# Patient Record
Sex: Male | Born: 1965 | Race: Black or African American | Hispanic: No | Marital: Married | Smoking: Never smoker
Health system: Southern US, Community
[De-identification: ages and names within clinical notes are randomized; demographics above are authoritative.]

## PROBLEM LIST (undated history)

## (undated) DIAGNOSIS — Z8049 Family history of malignant neoplasm of other genital organs: Secondary | ICD-10-CM

## (undated) DIAGNOSIS — Z800 Family history of malignant neoplasm of digestive organs: Secondary | ICD-10-CM

## (undated) DIAGNOSIS — Z9221 Personal history of antineoplastic chemotherapy: Secondary | ICD-10-CM

## (undated) DIAGNOSIS — G629 Polyneuropathy, unspecified: Secondary | ICD-10-CM

## (undated) DIAGNOSIS — E119 Type 2 diabetes mellitus without complications: Secondary | ICD-10-CM

## (undated) DIAGNOSIS — E785 Hyperlipidemia, unspecified: Secondary | ICD-10-CM

## (undated) DIAGNOSIS — Z8042 Family history of malignant neoplasm of prostate: Secondary | ICD-10-CM

## (undated) HISTORY — DX: Family history of malignant neoplasm of digestive organs: Z80.0

## (undated) HISTORY — DX: Family history of malignant neoplasm of prostate: Z80.42

## (undated) HISTORY — DX: Family history of malignant neoplasm of other genital organs: Z80.49

## (undated) HISTORY — DX: Type 2 diabetes mellitus without complications: E11.9

## (undated) HISTORY — PX: APPENDECTOMY: SHX54

## (undated) HISTORY — DX: Hyperlipidemia, unspecified: E78.5

## (undated) HISTORY — PX: HERNIA REPAIR: SHX51

---

## 2002-09-23 ENCOUNTER — Ambulatory Visit (HOSPITAL_BASED_OUTPATIENT_CLINIC_OR_DEPARTMENT_OTHER): Admission: RE | Admit: 2002-09-23 | Discharge: 2002-09-23 | Payer: Self-pay | Admitting: Orthopedic Surgery

## 2003-01-09 HISTORY — PX: ACHILLES TENDON REPAIR: SUR1153

## 2013-06-19 ENCOUNTER — Encounter: Payer: 59 | Attending: Internal Medicine | Admitting: Dietician

## 2013-06-19 ENCOUNTER — Encounter: Payer: Self-pay | Admitting: Dietician

## 2013-06-19 VITALS — Ht 68.25 in

## 2013-06-19 DIAGNOSIS — Z713 Dietary counseling and surveillance: Secondary | ICD-10-CM | POA: Diagnosis not present

## 2013-06-19 DIAGNOSIS — E1149 Type 2 diabetes mellitus with other diabetic neurological complication: Secondary | ICD-10-CM | POA: Insufficient documentation

## 2013-06-19 DIAGNOSIS — E785 Hyperlipidemia, unspecified: Secondary | ICD-10-CM | POA: Insufficient documentation

## 2013-06-19 NOTE — Progress Notes (Signed)
Appt start time: 0800 end time:  0900.  Assessment:  Patient was seen on  06/19/13 for individual diabetes education. Pt reports with admitted long term poor compliance to diet and exercise recommendations. Over course of session, however, pt admitted that no previous encounters were very well focused on the realities of his lifestyle, particularly travel, or his preferences. Pt stated clear willingness to restrict starches and eat more nonstarchy vegetables, but needed more clear options that are available during travel. He also enjoys lean proteins quite a bit, so a high protein diet for weight loss fit his preferences well. He does not wish to entirely d/c sugary drinks, but seems willing to decrease intake and avoid pairing them with starches at meals. Pt also states a willingness to exercise, but he states no one has laid out a plan that he could fit into his travel life with longer work hours.  Current HbA1c: unknown. Pt states over 8.   Preferred Learning Style:   No preference indicated   Learning Readiness:   Contemplating  MEDICATIONS: see list  DIETARY INTAKE: Pt is travels for work every week.  Usual eating pattern includes 2-3 meals and 0-1 snacks per day. Everyday foods include salads, seafood.  Avoided foods include none noted.    24-hr recall:  B ( AM): sausage, egg, and cheese biscuit with OJ and hash brown on travel days. Other days at hotel, fruit, bacon, avoids bread or will get a bagel with cream cheese, fruit juice to drink. Fri, Sat, Sun does not eat breakfast.  Snk ( AM): no snack, water or a 2nd juice  L ( PM): salad (greek salad or one with fruit and nuts). [place] and cheese 6 in sub from [employer] (olives, oil and vinegar, mayo). Fridays and beond when home still usually eaten out- hot dogs, soups, mcdonalds, salad with tuna on top. Fountain drink.  Snk ( PM): maybe some cold cuts, grapes, apple sauce, jello cups with fruit. OJ and lemonade, fruit punch.  D ( PM):  stuffed fish, shrimp, and mostly other seafood. Greek salad with salmon is a common favorite. Side dishes largely include rice, collard greens, sometimes fries. Focus tends to be on the protein foods. Fountain drink or EtOH drink (1). EtOH drink more common of late.  Snk ( PM): dessert only once every 2 weeks, night time snacks rare.  Beverages: juices, lemonade, soda, wine or cocktail. Juices and sodas tend to be 16 oz each.  Usual physical activity: no formal exercise. He routinely plans to exercise in his travel packing but it doesn't happen. 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 No active hobbies noted. Pt notes he enjoys cycling. He used to enjoy pick up basketball related to fear for injury, after injuring his achilles playing flag football.   Progress Towards Goal(s):  In progress.   Nutritional Diagnosis:  NI-5.8.2 Excessive carbohydrate intake As related to frequent consumption of sugary beverages.  As evidenced by diet recall showing 4 or more sugary drinks per day, BMI>30, HgbA1c>8.    Intervention:  Nutrition counseling provided.  Discussed diabetes disease process and treatment options.  Discussed physiology of diabetes and role of obesity on insulin resistance.  Encouraged moderate weight reduction to improve glucose levels.  Discussed role of medications and diet in glucose control  Provided education on macronutrients on glucose levels.  Provided education on carb counting, importance of regularly scheduled meals/snacks, and meal planning  Discussed effects of physical activity on glucose levels and  long-term glucose control.  Recommended 150 minutes of physical activity/week.  Reviewed patient medications.  Discussed role of medication on blood glucose and possible side effects  Discussed blood glucose monitoring and interpretation.  Discussed recommended target ranges and individual ranges.    Described short-term complications: hyper- and hypo-glycemia.  Discussed  causes,symptoms, and treatment options.  Discussed prevention, detection, and treatment of long-term complications.  Discussed the role of prolonged elevated glucose levels on body systems.  Discussed role of stress on blood glucose levels and discussed strategies to manage psychosocial issues.  Discussed recommendations for long-term diabetes self-care.  Established checklist for medical, dental, and emotional self-care.  Teaching Method Utilized:  Visual Auditory  Handouts given during visit include:  Living Well with Diabetes  The Plate Method  Carb Counting Handbook  Best Pro, Fat, and CHO rich foods for heart health  Barriers to learning/adherence to lifestyle change: preference for sweet drinks, long work hours  Diabetes self-care support plan:   [facility] support group  RD and pt worked together to establish a few specific goals around his schedule: 1. Pt will drink only one 16 oz. Sugary beverage per day, and this will be at a meal with little to no other CHO foods.  2. Pt will exercise as follows- skip Monday for travel and long work day. Tu-Th 15 minutes before breakfast and 15 minutes before dinner (total 30 minutes or more during work days). Fri-Sat 30 minutes per day working up to 60 minutes per day by increasing 5 minutes per day q 2 weeks. 3. Pt will follow a 3 carb choices per meal plus 1 carb choice snack per day eating plan. His drink will count as all carb choices for that meal. To do this, a number of restaurant options were discussed, and getting just a protein food and nonstarchy vegetables with dinner to pair with his sweet drink will likely work out best.   Demonstrated degree of understanding via:  Teach Back   Monitoring/Evaluation:  Dietary intake, exercise, portion control, and body weight in 2 month(s).

## 2013-08-21 ENCOUNTER — Ambulatory Visit: Payer: 59 | Admitting: *Deleted

## 2013-11-16 ENCOUNTER — Other Ambulatory Visit: Payer: Self-pay | Admitting: Family Medicine

## 2013-11-16 DIAGNOSIS — R748 Abnormal levels of other serum enzymes: Secondary | ICD-10-CM

## 2013-11-20 ENCOUNTER — Ambulatory Visit
Admission: RE | Admit: 2013-11-20 | Discharge: 2013-11-20 | Disposition: A | Discharge status: Home / Self Care | Payer: 59 | Source: Ambulatory Visit | Attending: Family Medicine | Admitting: Family Medicine

## 2013-11-20 DIAGNOSIS — R748 Abnormal levels of other serum enzymes: Secondary | ICD-10-CM

## 2016-01-16 DIAGNOSIS — J069 Acute upper respiratory infection, unspecified: Secondary | ICD-10-CM | POA: Diagnosis not present

## 2016-02-03 DIAGNOSIS — E1165 Type 2 diabetes mellitus with hyperglycemia: Secondary | ICD-10-CM | POA: Diagnosis not present

## 2016-02-03 DIAGNOSIS — Z23 Encounter for immunization: Secondary | ICD-10-CM | POA: Diagnosis not present

## 2016-02-03 DIAGNOSIS — E1142 Type 2 diabetes mellitus with diabetic polyneuropathy: Secondary | ICD-10-CM | POA: Diagnosis not present

## 2016-02-03 DIAGNOSIS — Z794 Long term (current) use of insulin: Secondary | ICD-10-CM | POA: Diagnosis not present

## 2016-03-31 DIAGNOSIS — J029 Acute pharyngitis, unspecified: Secondary | ICD-10-CM | POA: Diagnosis not present

## 2016-03-31 DIAGNOSIS — J0100 Acute maxillary sinusitis, unspecified: Secondary | ICD-10-CM | POA: Diagnosis not present

## 2016-05-04 DIAGNOSIS — Z794 Long term (current) use of insulin: Secondary | ICD-10-CM | POA: Diagnosis not present

## 2016-05-04 DIAGNOSIS — E1165 Type 2 diabetes mellitus with hyperglycemia: Secondary | ICD-10-CM | POA: Diagnosis not present

## 2016-05-04 DIAGNOSIS — E1142 Type 2 diabetes mellitus with diabetic polyneuropathy: Secondary | ICD-10-CM | POA: Diagnosis not present

## 2016-06-01 DIAGNOSIS — L299 Pruritus, unspecified: Secondary | ICD-10-CM | POA: Diagnosis not present

## 2016-06-07 DIAGNOSIS — H401121 Primary open-angle glaucoma, left eye, mild stage: Secondary | ICD-10-CM | POA: Diagnosis not present

## 2016-07-07 DIAGNOSIS — M7662 Achilles tendinitis, left leg: Secondary | ICD-10-CM | POA: Diagnosis not present

## 2016-07-10 DIAGNOSIS — M7662 Achilles tendinitis, left leg: Secondary | ICD-10-CM | POA: Diagnosis not present

## 2016-07-24 DIAGNOSIS — M7662 Achilles tendinitis, left leg: Secondary | ICD-10-CM | POA: Diagnosis not present

## 2017-02-08 DIAGNOSIS — J0100 Acute maxillary sinusitis, unspecified: Secondary | ICD-10-CM | POA: Diagnosis not present

## 2017-02-08 DIAGNOSIS — E1165 Type 2 diabetes mellitus with hyperglycemia: Secondary | ICD-10-CM | POA: Diagnosis not present

## 2017-03-01 DIAGNOSIS — E1142 Type 2 diabetes mellitus with diabetic polyneuropathy: Secondary | ICD-10-CM | POA: Diagnosis not present

## 2017-03-01 DIAGNOSIS — Z794 Long term (current) use of insulin: Secondary | ICD-10-CM | POA: Diagnosis not present

## 2017-03-01 DIAGNOSIS — E1165 Type 2 diabetes mellitus with hyperglycemia: Secondary | ICD-10-CM | POA: Diagnosis not present

## 2017-03-29 DIAGNOSIS — K921 Melena: Secondary | ICD-10-CM | POA: Diagnosis not present

## 2017-04-11 DIAGNOSIS — K625 Hemorrhage of anus and rectum: Secondary | ICD-10-CM | POA: Diagnosis not present

## 2017-05-24 DIAGNOSIS — D126 Benign neoplasm of colon, unspecified: Secondary | ICD-10-CM | POA: Diagnosis not present

## 2017-05-24 DIAGNOSIS — K625 Hemorrhage of anus and rectum: Secondary | ICD-10-CM | POA: Diagnosis not present

## 2017-05-24 DIAGNOSIS — C19 Malignant neoplasm of rectosigmoid junction: Secondary | ICD-10-CM | POA: Diagnosis not present

## 2017-05-31 DIAGNOSIS — C20 Malignant neoplasm of rectum: Secondary | ICD-10-CM | POA: Diagnosis not present

## 2017-06-05 ENCOUNTER — Other Ambulatory Visit: Payer: Self-pay | Admitting: Surgery

## 2017-06-05 DIAGNOSIS — C20 Malignant neoplasm of rectum: Secondary | ICD-10-CM

## 2017-06-06 ENCOUNTER — Ambulatory Visit
Admission: RE | Admit: 2017-06-06 | Discharge: 2017-06-06 | Disposition: A | Discharge status: Home / Self Care | Payer: 59 | Source: Ambulatory Visit | Attending: Surgery | Admitting: Surgery

## 2017-06-06 DIAGNOSIS — C20 Malignant neoplasm of rectum: Secondary | ICD-10-CM | POA: Diagnosis not present

## 2017-06-06 MED ORDER — GADOBENATE DIMEGLUMINE 529 MG/ML IV SOLN
20.0000 mL | Freq: Once | INTRAVENOUS | Status: AC | PRN
  Administered 2017-06-06: 20 mL via INTRAVENOUS

## 2017-06-06 MED ORDER — IOPAMIDOL (ISOVUE-300) INJECTION 61%
100.0000 mL | Freq: Once | INTRAVENOUS | Status: AC | PRN
  Administered 2017-06-06: 100 mL via INTRAVENOUS

## 2017-06-06 MED ORDER — IOHEXOL 300 MG/ML  SOLN
30.0000 mL | Freq: Once | INTRAMUSCULAR | Status: AC | PRN
  Administered 2017-06-06: 30 mL via ORAL

## 2017-06-07 DIAGNOSIS — C20 Malignant neoplasm of rectum: Secondary | ICD-10-CM | POA: Diagnosis not present

## 2017-06-10 ENCOUNTER — Encounter: Payer: Self-pay | Admitting: Radiation Oncology

## 2017-06-10 ENCOUNTER — Telehealth: Payer: Self-pay | Admitting: Oncology

## 2017-06-10 NOTE — Telephone Encounter (Signed)
Appt has been scheduled for the pt to see Dr. Benay Spice on 6/7 at 145pm. Pt aware to arrive 30 minutes early.

## 2017-06-11 ENCOUNTER — Encounter: Payer: Self-pay | Admitting: Genetics

## 2017-06-12 DIAGNOSIS — Z794 Long term (current) use of insulin: Secondary | ICD-10-CM | POA: Diagnosis not present

## 2017-06-12 DIAGNOSIS — C20 Malignant neoplasm of rectum: Secondary | ICD-10-CM | POA: Insufficient documentation

## 2017-06-12 DIAGNOSIS — E1165 Type 2 diabetes mellitus with hyperglycemia: Secondary | ICD-10-CM | POA: Diagnosis not present

## 2017-06-12 DIAGNOSIS — E1142 Type 2 diabetes mellitus with diabetic polyneuropathy: Secondary | ICD-10-CM | POA: Diagnosis not present

## 2017-06-12 NOTE — Progress Notes (Signed)
GI Location of Tumor / Histology: Rectal Cancer  Charles [name] presented to Dr. Michail Sermon with rectal bleeding and intermittent blood tinged stools.    Biopsies of LG Intestine-Cecum, Polyp and LG Intestine, Recto-Sigmoid. 05/24/2017   Colonoscopy 05/24/2017: Likely malignant tumor in the rectosigmoid colon at 12 cm proximal to the anus.  CT C/A/P 06/06/2017: Eccentric hyperenhancing upper left rectal 2.9 cm mass.  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  No definite additional sites of metastatic disease.  Solitary necrotic lesion in the superior right iliac crest nonspecific.  MRI Pelvis 06/06/2017: rectal adenocarcinoma T3c N1, 5.3 cm from the anal sphincter by MRI- interpreted as mid rectum.  Past/Anticipated interventions by surgeon, if any:  Dr. Nadeen Landau 06/07/2017 - Referrals underway for medical oncology and radiation oncology. - Return to the office in 4 weeks for follow-up and coordination of care or sooner based on MDT if necessary.   - We discussed at length today the anatomy physiology of the GI tract.   -We discussed the pathophysiology of rectal cancer.  We also discussed the pathophysiology of colorectal cancer.   -We discussed the role for surgery and potentially medical oncology as well as radiation oncology for neoadjuvant chemoradiation.     Past/Anticipated interventions by medical oncology, if any:  Dr. Benay Spice 06/14/2017 1:45 pm  Weight changes, if any: No  Bowel/Bladder complaints, if any: Has about 5-6 bowel movements a day.   Nausea / Vomiting, if any: No  [name] issues, if any: no [name] noted with bowel movements.  Any blood per rectum:  Blood has tapered down, now its sporadic.  BP 136/72   Pulse 98   Temp 98.4 F (36.9 C)   Resp 18   Ht 5\' 8"  (1.727 m)   Wt 224 lb 9.6 oz (101.9 kg)   SpO2 99%   BMI 34.15 kg/m    Wt Readings from Last 3 Encounters:  06/13/17 224 lb 9.6  oz (101.9 kg)    SAFETY ISSUES:  Prior radiation? No  Pacemaker/ICD? No  Possible current pregnancy? No  Is the patient on methotrexate? No  Current Complaints/Details: Patient does have internal hemorrhoids.

## 2017-06-13 ENCOUNTER — Encounter: Payer: Self-pay | Admitting: Radiation Oncology

## 2017-06-13 ENCOUNTER — Other Ambulatory Visit: Payer: Self-pay

## 2017-06-13 ENCOUNTER — Ambulatory Visit
Admission: RE | Admit: 2017-06-13 | Discharge: 2017-06-13 | Disposition: A | Discharge status: Home / Self Care | Payer: 59 | Source: Ambulatory Visit | Attending: Radiation Oncology | Admitting: Radiation Oncology

## 2017-06-13 VITALS — BP 136/72 | HR 98 | Temp 98.4°F | Resp 18 | Ht 68.0 in | Wt 224.6 lb

## 2017-06-13 DIAGNOSIS — C20 Malignant neoplasm of rectum: Secondary | ICD-10-CM

## 2017-06-13 DIAGNOSIS — Z79899 Other long term (current) drug therapy: Secondary | ICD-10-CM | POA: Diagnosis not present

## 2017-06-13 DIAGNOSIS — E785 Hyperlipidemia, unspecified: Secondary | ICD-10-CM | POA: Diagnosis not present

## 2017-06-13 DIAGNOSIS — E119 Type 2 diabetes mellitus without complications: Secondary | ICD-10-CM | POA: Diagnosis not present

## 2017-06-13 DIAGNOSIS — Z809 Family history of malignant neoplasm, unspecified: Secondary | ICD-10-CM | POA: Insufficient documentation

## 2017-06-13 DIAGNOSIS — K648 Other hemorrhoids: Secondary | ICD-10-CM | POA: Diagnosis not present

## 2017-06-13 DIAGNOSIS — Z794 Long term (current) use of insulin: Secondary | ICD-10-CM | POA: Insufficient documentation

## 2017-06-13 NOTE — Progress Notes (Signed)
Radiation Oncology         [phone] ________________________________ Initial Outpatient Consult Note Name: Kavan Devan        MRN: [id number]  Date of Service: 06/13/2017 DOB: September 08, 1965  CZ:[id number], Dibas, MD  Ileana Roup, MD     REFERRING PHYSICIAN: Ileana Roup, MD   DIAGNOSIS: The encounter diagnosis was Rectal cancer [facility]). 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 male seen at the request of Dr. Dema Severin for newly diagnosed rectal cancer. The patient presented to Dr. Michail Sermon with a one month history of rectal bleeding and intermittent blood tinged stools. He denied any pain at this time. He was referred to Dr. Dema Severin and proceeded to undergo colonoscopy on 05/24/17. This showed a likely malignant tumor in the rectosigmoid colon approximately 12 cm proximal to the anus. Biopsy was also performed at this time. Pathology showed invasive poorly differentiated adenocarcinoma with associated low and high grade adenomatous dysplasia in the recto-sigmoid as well as  tubular adenoma in the cecum.   CT of the chest, abdomen, and pelvis on 06/06/17 for disease staging showed eccentric hyperenhancing upper left rectal mass measuring 2.9 cm as well as multiple enlarged left posterior perirectal and upper left presacral nodes compatible with pelvic nodal metastases. There were no definite additional sites of metastatic disease but there was a solitary necrotic lesion in the superior right iliac crest which was nonspecific. He had an MRI of the pelvis on 06/06/17 which revealed a T3c, N1 rectal adenocarcinoma approximately 5.3 cm from the anal sphincter.   The patient reports his rectal bleeding has now decreased and it only occurs sporadically. He notes 5-6 bowel movements a day and denies pain with bowel movements. He also notes internal hemorrhoids.  He presents to clinic today, with his wife, to discuss the  potential role for radiation therapy in the management of his disease.   PREVIOUS RADIATION THERAPY: No   PAST MEDICAL HISTORY:  Past Medical History:  Diagnosis Date  . Diabetes mellitus without complication ([place])   . Hyperlipidemia        PAST SURGICAL HISTORY: Past Surgical History:  Procedure Laterality Date  . ACHILLES TENDON REPAIR Right 2005  . APPENDECTOMY    . HERNIA REPAIR     as a baby     FAMILY HISTORY:  Family History  Problem Relation Age of Onset  . Hypertension Mother   . Hyperlipidemia Mother   . Cancer Paternal Aunt   . Non-Hodgkin's lymphoma Paternal Aunt      SOCIAL HISTORY:  reports that he has never smoked. He has never used smokeless tobacco. He reports that he drinks alcohol. He reports that he does not use drugs.  He is married and lives in [place].  He works as a Optometrist, frequently traveling for work.   ALLERGIES: Cefradine [cephradine]   MEDICATIONS:  Current Outpatient Medications  Medication Sig Dispense Refill  . atorvastatin (LIPITOR) 80 MG tablet Take 80 mg by mouth daily at 6 PM.     . cetirizine (ZYRTEC) 10 MG tablet Take 10 mg by mouth daily as needed for allergies.    Marland Kitchen empagliflozin (JARDIANCE) 25 MG TABS tablet Take 25 mg by mouth daily.    Marland Kitchen ibuprofen (ADVIL,MOTRIN) 200 MG tablet Take 400 mg by mouth every 6 (six) hours as needed for mild pain.    Marland Kitchen insulin lispro protamine-lispro (HUMALOG 75/25 MIX) (75-25) 100 UNIT/ML SUSP injection  Inject 60-150 Units into the skin 2 (two) times daily with a meal.     . liraglutide (VICTOZA) 18 MG/3ML SOPN Inject 1.8 mg into the skin daily.     No current facility-administered medications for this encounter.      REVIEW OF SYSTEMS: On review of systems, the patient reports that he is doing well overall. He denies any chest pain, shortness of breath, cough, fevers, chills, night sweats, unintended weight changes. He reports 5-6 bowel movements a day without pain. He reports sporadic  blood per rectum. He denies any bladder disturbances, and denies abdominal pain, nausea or vomiting. He denies any new musculoskeletal or joint aches or pains. A complete review of systems is obtained and is otherwise negative.   PHYSICAL EXAM:  Wt Readings from Last 3 Encounters:  06/13/17 224 lb 9.6 oz (101.9 kg)   Temp Readings from Last 3 Encounters:  06/13/17 98.4 F (36.9 C)   BP Readings from Last 3 Encounters:  06/13/17 136/72   Pulse Readings from Last 3 Encounters:  06/13/17 98   Pain Assessment Pain Score: 0-No pain/10  In general this is a well appearing african american gentleman in no acute distress. He is alert and oriented x4 and appropriate throughout the examination. HEENT reveals that the patient is normocephalic, atraumatic. EOMs are intact. PERRLA. Skin is intact without any evidence of gross lesions. Cardiovascular exam reveals a regular rate and rhythm, no clicks rubs or murmurs are auscultated. Chest is clear to auscultation bilaterally. Lymphatic assessment is performed and does not reveal any adenopathy in the cervical, supraclavicular, axillary, or inguinal chains. Abdomen has active bowel sounds in all quadrants and is intact. The abdomen is soft, non tender, non distended. Lower extremities are negative for pretibial pitting edema, deep calf tenderness, cyanosis or clubbing.   ECOG = 0  0 - Asymptomatic (Fully active, able to carry on all predisease activities without restriction)  1 - Symptomatic but completely ambulatory (Restricted in physically strenuous activity but ambulatory and able to carry out work of a light or sedentary nature. For example, light housework, office work)  2 - Symptomatic, <50% in bed during the day (Ambulatory and capable of all self care but unable to carry out any work activities. Up and about more than 50% of waking hours)  3 - Symptomatic, >50% in bed, but not bedbound (Capable of only limited self-care, confined to bed or  chair 50% or more of waking hours)  4 - Bedbound (Completely disabled. Cannot carry on any self-care. Totally confined to bed or chair)  5 - Death   Eustace Pen MM, Creech RH, Tormey DC, et al. [phone]). "Toxicity and response criteria of the [facility]". [place] Oncol. 5 (6): 649-55    LABORATORY DATA:  No results found for: WBC, HGB, HCT, MCV, PLT No results found for: NA, K, CL, CO2 No results found for: ALT, AST, GGT, ALKPHOS, BILITOT    RADIOGRAPHY: Ct Chest W Contrast  Result Date: 06/06/2017 CLINICAL DATA:  Reported history of rectal cancer. Bloody stools. Prior appendectomy. EXAM: CT CHEST, ABDOMEN, AND PELVIS WITH CONTRAST TECHNIQUE: Multidetector CT imaging of the chest, abdomen and pelvis was performed following the standard protocol during bolus administration of intravenous contrast. CONTRAST:  155mL ISOVUE-300 IOPAMIDOL (ISOVUE-300) INJECTION 61%, 68mL OMNIPAQUE IOHEXOL 300 MG/ML SOLN; 30mL OMNIPAQUE IOHEXOL 300 MG/ML SOLN, 151mL ISOVUE-300 IOPAMIDOL (ISOVUE-300) INJECTION 61% COMPARISON:  None. FINDINGS: CT CHEST FINDINGS Cardiovascular: Normal heart size. No significant pericardial effusion/thickening. Normal course  and caliber of the thoracic aorta. 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 Normal caliber pulmonary arteries. No central pulmonary emboli. Mediastinum/Nodes: No discrete thyroid nodules. Unremarkable esophagus. No pathologically enlarged axillary, mediastinal or hilar lymph nodes. Lungs/Pleura: No pneumothorax. No pleural effusion. No acute consolidative airspace disease, lung masses or significant pulmonary nodules. Musculoskeletal:  No aggressive appearing focal osseous lesions. CT ABDOMEN PELVIS FINDINGS Hepatobiliary: Probable diffuse hepatic steatosis. No definite liver surface irregularity. No liver masses. Normal gallbladder with no radiopaque cholelithiasis. No biliary  ductal dilatation. Pancreas: Normal, with no mass or duct dilation. Spleen: Normal size. No mass. Adrenals/Urinary Tract: Normal adrenals. Normal kidneys with no hydronephrosis and no renal mass. Normal bladder. Stomach/Bowel: Normal non-distended stomach. Normal caliber small bowel with no small bowel wall thickening. Appendectomy. There is an eccentric slightly hyperenhancing upper left rectal mass measuring 2.9 x 2.8 x 2.9 cm (series 2/image 101). No additional sites of large bowel wall thickening. Vascular/Lymphatic: Normal caliber abdominal aorta. Patent portal, splenic, hepatic and renal veins. Multiple enlarged left posterior perirectal nodes, largest 1.4 cm (series 2/image 96). Mildly prominent rounded 0.7 cm upper left presacral node (series 2/image 93). No additional pathologically enlarged lymph nodes in the abdomen or pelvis. Reproductive: Top-normal size prostate. Other: No pneumoperitoneum, ascites or focal fluid collection. Musculoskeletal: Small nonspecific 1 cm sclerotic lesion in the superior right iliac crest (series 2/image 89). No additional focal osseous lesions. IMPRESSION: 1. Eccentric hyperenhancing upper left rectal 2.9 cm mass, compatible with the provided history of primary rectal malignancy. 2. 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 3. No definite additional sites of metastatic disease. No liver or thoracic metastases. Solitary small sclerotic lesion in the superior right iliac crest, nonspecific, consider correlation with PET-CT, bone scan or short-term follow-up CT. Electronically Signed   By: Ilona Sorrel M.D.   On: 06/06/2017 13:48   Mr Pelvis W QQ Contrast  Result Date: 06/06/2017 CLINICAL DATA:  Newly diagnosed rectal carcinoma.  Local staging. Creatinine was obtained on site at [place] at [address] Results: Creatinine 0.8 mg/dL. EXAM: MRI PELVIS WITHOUT AND WITH CONTRAST  TECHNIQUE: Multiplanar multisequence MR imaging of the pelvis was performed both before and after administration of intravenous contrast. Small amount of [place] gel was administered per rectum to optimize tumor evaluation. CONTRAST:  68mL MULTIHANCE GADOBENATE DIMEGLUMINE 529 MG/ML IV SOLN COMPARISON:  None. FINDINGS: TUMOR LOCATION Location from Anal Verge: Middle third Shortest Distance from Tumor to Anal Sphincter: 5.3 cm TUMOR DESCRIPTION Circumferential Extent: Left lateral wall from 11-5 o'clock positions Tumor Length: 4.8 cm T - CATEGORY Extension through Muscularis Propria: 12 mm =  T3c Shortest Distance of any tumor/node from Mesorectal Fascia: 5 mm from 33mm high left posterior perirectal lymph node on image 2/4 Extramural Vascular Invasion/Tumor Thrombus:  No Invasion of Anterior Peritoneal Reflection:  No Involvement of Adjacent Organs or Pelvic Sidewall: No Levator Ani Involvement:  No N - CATEGORY Mesorectal Lymph Nodes >=71mm: 3 total, with largest lymph node measuring 14 mm on image 2/4 = N1 Extra-mesorectal Lymphadenopathy:  No Other:  None. IMPRESSION: Rectal adenocarcinoma T stage:  T3c Rectal adenocarcinoma N stage:  N1 Distance from tumor to the anal sphincter is 5.3 cm. Electronically Signed   By: Earle Gell M.D.   On: 06/06/2017 14:30   Ct Abdomen Pelvis W Contrast  Result Date: 06/06/2017 CLINICAL DATA:  Reported history of rectal cancer. Bloody stools. Prior  appendectomy. EXAM: CT CHEST, ABDOMEN, AND PELVIS WITH CONTRAST TECHNIQUE: Multidetector CT imaging of the chest, abdomen and pelvis was performed following the standard protocol during bolus administration of intravenous contrast. CONTRAST:  124mL ISOVUE-300 IOPAMIDOL (ISOVUE-300) INJECTION 61%, 34mL OMNIPAQUE IOHEXOL 300 MG/ML SOLN; 39mL OMNIPAQUE IOHEXOL 300 MG/ML SOLN, 175mL ISOVUE-300 IOPAMIDOL (ISOVUE-300) INJECTION 61% COMPARISON:  None. FINDINGS: CT CHEST FINDINGS Cardiovascular: Normal heart size. No significant pericardial  effusion/thickening. Normal course and caliber of the thoracic aorta. 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 Normal caliber pulmonary arteries. No central pulmonary emboli. Mediastinum/Nodes: No discrete thyroid nodules. Unremarkable esophagus. No pathologically enlarged axillary, mediastinal or hilar lymph nodes. Lungs/Pleura: No pneumothorax. No pleural effusion. No acute consolidative airspace disease, lung masses or significant pulmonary nodules. Musculoskeletal:  No aggressive appearing focal osseous lesions. CT ABDOMEN PELVIS FINDINGS Hepatobiliary: Probable diffuse hepatic steatosis. No definite liver surface irregularity. No liver masses. Normal gallbladder with no radiopaque cholelithiasis. No biliary ductal dilatation. Pancreas: Normal, with no mass or duct dilation. Spleen: Normal size. No mass. Adrenals/Urinary Tract: Normal adrenals. Normal kidneys with no hydronephrosis and no renal mass. Normal bladder. Stomach/Bowel: Normal non-distended stomach. Normal caliber small bowel with no small bowel wall thickening. Appendectomy. There is an eccentric slightly hyperenhancing upper left rectal mass measuring 2.9 x 2.8 x 2.9 cm (series 2/image 101). No additional sites of large bowel wall thickening. Vascular/Lymphatic: Normal caliber abdominal aorta. Patent portal, splenic, hepatic and renal veins. Multiple enlarged left posterior perirectal nodes, largest 1.4 cm (series 2/image 96). Mildly prominent rounded 0.7 cm upper left presacral node (series 2/image 93). No additional pathologically enlarged lymph nodes in the abdomen or pelvis. Reproductive: Top-normal size prostate. Other: No pneumoperitoneum, ascites or focal fluid collection. Musculoskeletal: Small nonspecific 1 cm sclerotic lesion in the superior right iliac crest (series 2/image 89). No additional focal osseous lesions. IMPRESSION: 1. Eccentric hyperenhancing upper left  rectal 2.9 cm mass, compatible with the provided history of primary rectal malignancy. 2. 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 3. No definite additional sites of metastatic disease. No liver or thoracic metastases. Solitary small sclerotic lesion in the superior right iliac crest, nonspecific, consider correlation with PET-CT, bone scan or short-term follow-up CT. Electronically Signed   By: Ilona Sorrel M.D.   On: 06/06/2017 13:48       IMPRESSION/PLAN: 15. 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 We discussed the natural history of rectal carcinoma and general treatment, highlighting the role of radiotherapy in the management. We discussed the available radiation techniques, and focused on the details of logistics and delivery. We reviewed the anticipated acute and late sequelae associated with radiation in this setting. The patient was encouraged to ask questions that were answered to his satisfaction.  The patient will meet with Dr. Benay Spice on 06/14/17 at 1:45 pm to discuss chemotherapy options. It is anticipated that the patient will complete 8 cycles of neoadjuvant chemotherapy followed by an anticipated course of 28 daily radiation treatments delivered to the rectum over a period of 5.5 weeks, concurrent with oral chemotherapy (Xeloda). We will await the patient's referral back to radiation oncology at the completion of his neoadjuvant chemotherapy and at that time, will proceed with scheduling CT simulation for treatment planning. Once he has completed his neoadjuvant therapies, he anticipates proceeding with surgical resection.  He appears  to have a good understanding of his disease and our recommendations and is in agreement with the current plan.  He knows to call with any questions or concerns in the interim.  We  enjoyed meeting him and his wife today and look forward to participating in the care of this very nice gentleman.  We spent 60 minutes minutes face to face with the patient and more than 50% of that time was spent in counseling and/or coordination of care.  The above documentation reflects my direct findings during this shared patient visit. Please see the separate note by Dr. Lisbeth Renshaw on this date for the remainder of the patient's plan of care.    Nicholos Johns, PA-C ------------------------------------------------  Jodelle Gross, MD, PhD   This document serves as a record of services personally performed by Kyung Rudd, MD and Freeman Caldron, PA-C. It was created on their behalf by Bethann Humble, a trained medical scribe. The creation of this record is based on the scribe's personal observations and the provider's statements to them. This document has been checked and approved by the attending provider.

## 2017-06-13 NOTE — Patient Instructions (Signed)
Charles [name]  06/13/2017   Your procedure is scheduled on: 06-17-17   Report to [facility] Main  Entrance    Report to Admitting at 1:15 PM    Call this number if you have problems the morning of surgery [phone]   Remember: Do not eat food or drink liquids :After Midnight. You may have a Clear Liquid Diet from Midnight until 9:15 AM. After 9:15 AM, nothing until after surgery.      CLEAR LIQUID DIET   Foods Allowed                                                                     Foods Excluded  Coffee and tea, regular and decaf                             liquids that you cannot  Plain Jell-O in any flavor                                             see through such as: Fruit ices (not with fruit pulp)                                     milk, soups, orange juice  Iced Popsicles                                    All solid food Carbonated beverages, regular and diet                                    Cranberry, grape and apple juices Sports drinks like Gatorade Lightly seasoned clear broth or consume(fat free) Sugar, honey syrup  Sample Menu Breakfast                                Lunch                                     Supper Cranberry juice                    Beef broth                            Chicken broth Jell-O                                     Grape juice  Apple juice Coffee or tea                        Jell-O                                      Popsicle                                                Coffee or tea                        Coffee or tea  _____________________________________________________________________    Take these medicines the morning of surgery with A SIP OF WATER: None   DO NOT TAKE ANY DIABETIC MEDICATIONS DAY OF YOUR SURGERY                               You may not have any metal on your body including hair pins and              piercings  Do not wear jewelry, lotions,  powders or deodorant             Men may shave face and neck.   Do not bring valuables to the hospital. [place].  Contacts, dentures or bridgework may not be worn into surgery.      Patients discharged the day of surgery will not be allowed to drive home.  Name and phone number of your driver:  Special Instructions: N/A              Please read over the following fact sheets you were given: _____________________________________________________________________  How to Manage Your Diabetes Before and After Surgery  Why is it important to control my blood sugar before and after surgery? . Improving blood sugar levels before and after surgery helps healing and can limit problems. . A way of improving blood sugar control is eating a healthy diet by: o  Eating less sugar and carbohydrates o  Increasing activity/exercise o  Talking with your doctor about reaching your blood sugar goals . High blood sugars (greater than 180 mg/dL) can raise your risk of infections and slow your recovery, so you will need to focus on controlling your diabetes during the weeks before surgery. . Make sure that the doctor who takes care of your diabetes knows about your planned surgery including the date and location.  How do I manage my blood sugar before surgery? . Check your blood sugar at least 4 times a day, starting 2 days before surgery, to make sure that the level is not too high or low. o Check your blood sugar the morning of your surgery when you wake up and every 2 hours until you get to the Short Stay unit. . If your blood sugar is less than 70 mg/dL, you will need to treat for low blood sugar: o Do not take insulin. o Treat a low blood sugar (less than 70 mg/dL) with  cup of clear juice (cranberry or apple), 4 glucose tablets, OR glucose gel. o Recheck blood  sugar in 15 minutes after treatment (to make sure it is greater than 70 mg/dL). If your  blood sugar is not greater than 70 mg/dL on recheck, call [phone] for further instructions. . Report your blood sugar to the short stay nurse when you get to Short Stay.  . If you are admitted to the hospital after surgery: o Your blood sugar will be checked by the staff and you will probably be given insulin after surgery (instead of oral diabetes medicines) to make sure you have good blood sugar levels. o The goal for blood sugar control after surgery is 80-180 mg/dL.   WHAT DO I DO ABOUT MY DIABETES MEDICATION?  Marland Kitchen Do not take oral diabetes medicines (pills) the morning of surgery.  . THE DAY BEFORE SURGERY, take your usual dose of Jardiance, and Victoza. Only take 42-105 units of Humalog 75/25 insulin.       . The day of surgery, do not take other diabetes injectables, including Byetta (exenatide), Bydureon (exenatide ER), Victoza (liraglutide), or Trulicity (dulaglutide).    Patient Signature:  Date:   Nurse Signature:  Date:   Reviewed and Endorsed by [facility] Patient Education Committee, August 2015           [facility] - Preparing for Surgery Before surgery, you can play an important role.  Because skin is not sterile, your skin needs to be as free of germs as possible.  You can reduce the number of germs on your skin by washing with CHG (chlorahexidine gluconate) soap before surgery.  CHG is an antiseptic cleaner which kills germs and bonds with the skin to continue killing germs even after washing. Please DO NOT use if you have an allergy to CHG or antibacterial soaps.  If your skin becomes reddened/irritated stop using the CHG and inform your nurse when you arrive at Short Stay. Do not shave (including legs and underarms) for at least 48 hours prior to the first CHG shower.  You may shave your face/neck. Please follow these instructions carefully:  1.  Shower with CHG Soap the night before surgery and the  morning of Surgery.  2.  If you choose to wash your hair,  wash your hair first as usual with your  normal  shampoo.  3.  After you shampoo, rinse your hair and body thoroughly to remove the  shampoo.                           4.  Use CHG as you would any other liquid soap.  You can apply chg directly  to the skin and wash                       Gently with a scrungie or clean washcloth.  5.  Apply the CHG Soap to your body ONLY FROM THE NECK DOWN.   Do not use on face/ open                           Wound or open sores. Avoid contact with eyes, ears mouth and genitals (private parts).                       Wash face,  Genitals (private parts) with your normal soap.             6.  Wash thoroughly, paying special attention to the  area where your surgery  will be performed.  7.  Thoroughly rinse your body with warm water from the neck down.  8.  DO NOT shower/wash with your normal soap after using and rinsing off  the CHG Soap.                9.  Pat yourself dry with a clean towel.            10.  Wear clean pajamas.            11.  Place clean sheets on your bed the night of your first shower and do not  sleep with pets. Day of Surgery : Do not apply any lotions/deodorants the morning of surgery.  Please wear clean clothes to the hospital/surgery center.  FAILURE TO FOLLOW THESE INSTRUCTIONS MAY RESULT IN THE CANCELLATION OF YOUR SURGERY PATIENT SIGNATURE_________________________________  NURSE SIGNATURE__________________________________  ________________________________________________________________________

## 2017-06-14 ENCOUNTER — Encounter: Payer: Self-pay | Admitting: Oncology

## 2017-06-14 ENCOUNTER — Encounter: Payer: Self-pay | Admitting: *Deleted

## 2017-06-14 ENCOUNTER — Other Ambulatory Visit: Payer: Self-pay

## 2017-06-14 ENCOUNTER — Ambulatory Visit: Payer: Self-pay | Admitting: Surgery

## 2017-06-14 ENCOUNTER — Encounter (HOSPITAL_COMMUNITY): Payer: Self-pay

## 2017-06-14 ENCOUNTER — Encounter (HOSPITAL_COMMUNITY)
Admission: RE | Admit: 2017-06-14 | Discharge: 2017-06-14 | Disposition: A | Discharge status: Home / Self Care | Payer: 59 | Source: Ambulatory Visit | Attending: Surgery | Admitting: Surgery

## 2017-06-14 ENCOUNTER — Inpatient Hospital Stay: Payer: 59 | Attending: Oncology | Admitting: Oncology

## 2017-06-14 ENCOUNTER — Encounter: Payer: Self-pay | Admitting: General Practice

## 2017-06-14 ENCOUNTER — Telehealth: Payer: Self-pay | Admitting: Oncology

## 2017-06-14 VITALS — BP 130/65 | HR 100 | Temp 97.8°F | Resp 18 | Ht 68.0 in | Wt 226.9 lb

## 2017-06-14 DIAGNOSIS — D120 Benign neoplasm of cecum: Secondary | ICD-10-CM

## 2017-06-14 DIAGNOSIS — C20 Malignant neoplasm of rectum: Secondary | ICD-10-CM

## 2017-06-14 DIAGNOSIS — Z8042 Family history of malignant neoplasm of prostate: Secondary | ICD-10-CM

## 2017-06-14 DIAGNOSIS — E119 Type 2 diabetes mellitus without complications: Secondary | ICD-10-CM | POA: Diagnosis not present

## 2017-06-14 DIAGNOSIS — Z8249 Family history of ischemic heart disease and other diseases of the circulatory system: Secondary | ICD-10-CM | POA: Diagnosis not present

## 2017-06-14 DIAGNOSIS — Z5111 Encounter for antineoplastic chemotherapy: Secondary | ICD-10-CM | POA: Insufficient documentation

## 2017-06-14 DIAGNOSIS — Z807 Family history of other malignant neoplasms of lymphoid, hematopoietic and related tissues: Secondary | ICD-10-CM | POA: Diagnosis not present

## 2017-06-14 DIAGNOSIS — E785 Hyperlipidemia, unspecified: Secondary | ICD-10-CM | POA: Diagnosis not present

## 2017-06-14 DIAGNOSIS — Z888 Allergy status to other drugs, medicaments and biological substances status: Secondary | ICD-10-CM | POA: Diagnosis not present

## 2017-06-14 DIAGNOSIS — Z794 Long term (current) use of insulin: Secondary | ICD-10-CM | POA: Diagnosis not present

## 2017-06-14 DIAGNOSIS — I10 Essential (primary) hypertension: Secondary | ICD-10-CM | POA: Diagnosis not present

## 2017-06-14 DIAGNOSIS — Z79899 Other long term (current) drug therapy: Secondary | ICD-10-CM | POA: Diagnosis not present

## 2017-06-14 LAB — BASIC METABOLIC PANEL
Anion gap: 9 (ref 5–15)
BUN: 12 mg/dL (ref 6–20)
CALCIUM: 9.2 mg/dL (ref 8.9–10.3)
CHLORIDE: 103 mmol/L (ref 101–111)
CO2: 25 mmol/L (ref 22–32)
CREATININE: 0.96 mg/dL (ref 0.61–1.24)
GFR calc non Af Amer: >60 mL/min (ref >60)
Glucose, Bld: 269 mg/dL — ABNORMAL HIGH (ref 65–99)
Potassium: 4 mmol/L (ref 3.5–5.1)
Sodium: 137 mmol/L (ref 135–145)

## 2017-06-14 LAB — CBC
HEMATOCRIT: 42.9 % (ref 39.0–52.0)
HEMOGLOBIN: 14.5 g/dL (ref 13.0–17.0)
MCH: 29.8 pg (ref 26.0–34.0)
MCHC: 33.8 g/dL (ref 30.0–36.0)
MCV: 88.1 fL (ref 78.0–100.0)
Platelets: 218 10*3/uL (ref 150–400)
RBC: 4.87 MIL/uL (ref 4.22–5.81)
RDW: 12.8 % (ref 11.5–15.5)
WBC: 5.8 10*3/uL (ref 4.0–10.5)

## 2017-06-14 LAB — DIFFERENTIAL
BASOS ABS: 0 10*3/uL (ref 0.0–0.1)
BASOS PCT: 0 %
Eosinophils Absolute: 0.2 10*3/uL (ref 0.0–0.7)
Eosinophils Relative: 3 %
LYMPHS PCT: 18 %
Lymphs Abs: 1.1 10*3/uL (ref 0.7–4.0)
MONO ABS: 0.3 10*3/uL (ref 0.1–1.0)
MONOS PCT: 6 %
Neutro Abs: 4.2 10*3/uL (ref 1.7–7.7)
Neutrophils Relative %: 73 %

## 2017-06-14 LAB — HEPATIC FUNCTION PANEL
ALK PHOS: 99 U/L (ref 38–126)
ALT: 27 U/L (ref 17–63)
AST: 18 U/L (ref 15–41)
Albumin: 3.9 g/dL (ref 3.5–5.0)
Bilirubin, Direct: 0.1 mg/dL (ref 0.1–0.5)
Indirect Bilirubin: 0.6 mg/dL (ref 0.3–0.9)
TOTAL PROTEIN: 7.3 g/dL (ref 6.5–8.1)
Total Bilirubin: 0.7 mg/dL (ref 0.3–1.2)

## 2017-06-14 LAB — GLUCOSE, CAPILLARY: GLUCOSE-CAPILLARY: 263 mg/dL — AB (ref 65–99)

## 2017-06-14 LAB — HEMOGLOBIN A1C
Hgb A1c MFr Bld: 9.9 % — ABNORMAL HIGH (ref 4.8–5.6)
MEAN PLASMA GLUCOSE: 237.43 mg/dL

## 2017-06-14 MED ORDER — CHLORHEXIDINE GLUCONATE CLOTH 2 % EX PADS
6.0000 | MEDICATED_PAD | Freq: Once | CUTANEOUS | Status: DC
  Filled 2017-06-14: qty 6

## 2017-06-14 NOTE — Progress Notes (Signed)
START ON PATHWAY REGIMEN - Colorectal     A cycle is every 14 days:     Oxaliplatin      Leucovorin      5-Fluorouracil      5-Fluorouracil   **Always confirm dose/schedule in your pharmacy ordering system**  Patient Characteristics: Rectal Neoadjuvant/Adjuvant, T3 - T4, N0 or Any T, N+, Neoadjuvant Therapy Current evidence of distant metastases<= No AJCC T Category: Staged < 8th Ed. AJCC N Category: Staged < 8th Ed. AJCC M Category: Staged < 8th Ed. AJCC 8 Stage Grouping: Staged < 8th Ed. Intent of Therapy: Curative Intent, Discussed with Patient 

## 2017-06-14 NOTE — Progress Notes (Signed)
[facility] [place] Psychosocial Distress Screening Clinical Social Work  Clinical Social Work was referred by distress screening protocol.  The patient scored a 5 on the Psychosocial Distress Thermometer which indicates moderate distress. Clinical Social Worker contacted patient by phone to assess for distress and other psychosocial needs. Has support from wife.  Does not anticipate problems w getting to appointments.  Wants to meet w nutritionist, nurse navigator advised.  CSW will prepare information packet to give to patient at today's appointment w MD.  Encouraged to access [facility] as needed.    ONCBCN DISTRESS SCREENING 06/13/2017  Screening Type Initial Screening  Distress experienced in past week (1-10) 5  Practical problem type Transportation  Physical Problem type Pain    Clinical Social Worker follow up needed: No.  If yes, follow up plan:  Beverely Pace, [place], LCSW Clinical Social Worker Phone:  [phone]

## 2017-06-14 NOTE — Progress Notes (Signed)
  Oncology Nurse Navigator Documentation  Navigator Location: [facility] (06/14/17 1517) Referral date to RadOnc/MedOnc: 06/10/17 (06/14/17 1517) )Navigator Encounter Type: Initial MedOnc (06/14/17 1517) Introduced the role of GI navigator as well as provided contact information for members of his treatment team. No barriers to treatment identified. Patient has home, resources and supportive friends and family.   Abnormal Finding Date: 05/24/17 (06/14/17 1517) Confirmed Diagnosis Date: 05/24/17 (06/14/17 1517)                   Barriers/Navigation Needs: Education (06/14/17 1517) Information from [email or web address] on colorectal cancer provided. Information on GI support group provided. Education: Contractor;Newly Diagnosed Cancer Education (06/14/17 1517) Teaching and demonstration of Port-A-Cath using [employer] completed.Reviewed general information regarding what to expect during chemo education class and first infusion. Interventions: Education (06/14/17 1517)     Education Method: Teach-back;Verbal;Written;Demonstration (06/14/17 1517)      Acuity: Level 3 (06/14/17 1517)         Time Spent with Patient: 90 (06/14/17 1517)

## 2017-06-14 NOTE — Progress Notes (Signed)
[name] Patient Consult   Referring MD: Keaston Pile 52 y.o.  November 25, 1965    Reason for Referral: Rectal cancer   HPI: Charles [name] [name] noted blood in the stool beginning in March of this year.  He was referred to Dr. Michail Sermon and was taken to a colonoscopy on 05/24/2017.  A nonobstructing mass was found in the rectosigmoid measured at 12 cm proximal to the anus.  The mass was partially circumferential.  Oozing was present.  The mass was biopsied.  The area was tattooed.  A 5 mm polyp was removed from the cecum. The pathology at [facility] gastroenterology revealed a tubular adenoma at the cecum.  The mass from the rectosigmoid revealed invasive poorly differentiated adenocarcinoma with associated low and high-grade adenomatous dysplasia.  He underwent staging CTs of the chest, abdomen, and pelvis on 06/06/2017.  Diffuse hepatic steatosis.  No liver masses.  A hyperenhancing upper left rectal mass was noted.  Multiple enlarged left posterior perirectal lymph nodes with the largest measuring 1.4 cm.  Mildly prominent 0.7 cm left presacral node.  Small nonspecific 1 cm sclerotic lesion at the superior right iliac.  He saw Dr. Dema Severin. A pelvic MRI on 06/06/2017 found the tumor at 5.3 cm from the anal sphincter.  Tumor extended through the muscularis propria.  3 mesorectal lymph nodes were identified.  The tumor was staged as a T3CN1 lesion.  Charles [name] case was presented at the GI tumor conference on 06/12/2017.  A total neoadjuvant approach was recommended.  He saw Dr. Lisbeth Renshaw yesterday.  Past Medical History:  Diagnosis Date  . Diabetes mellitus without complication ([place])    Type II  . Hyperlipidemia     .  Glaucoma  Past Surgical History:  Procedure Laterality Date  . ACHILLES TENDON REPAIR Right 2005  . APPENDECTOMY    . HERNIA REPAIR     as a baby    Medications: Reviewed  Allergies:  Allergies  Allergen Reactions  . Cefradine [Cephradine]     Gi  upset     Family history: Maternal aunt had non-Hodgkin's lymphoma.  A maternal great uncle had prostate cancer.  No family history of colon or rectal cancer.  Social History:   He lives in [place].  He works as an Contractor.  He reports social alcohol use.  He does not smoke cigarettes.  No transfusion history.  No risk factor for HIV or hepatitis.  ROS:   Positives include: Rectal bleeding since March 2019 "stomach aches "after eating spicy food, frequent bowel movements for the past 1.5 years  A complete ROS was otherwise negative.  Physical Exam:  Blood pressure 130/65, pulse 100, temperature 97.8 F (36.6 C), temperature source Oral, resp. rate 18, height 5\' 8"  (1.727 m), weight 226 lb 14.4 oz (102.9 kg), SpO2 98 %.  HEENT: Oropharynx without visible mass, neck without mass Lungs: Clear bilaterally Cardiac: Regular rate and rhythm Abdomen: No hepatosplenomegaly, no mass, nontender GU: The left testicle is larger than the right side (he reports this is a chronic finding), no discrete mass Vascular: No leg edema Lymph nodes: No cervical, supraclavicular, axillary, or inguinal nodes Neurologic: Alert and oriented, the motor exam appears intact in the upper and lower extremities Skin: No rash Musculoskeletal: No spine tenderness   LAB:  CBC  Lab Results  Component Value Date   WBC 5.8 06/14/2017   HGB 14.5 06/14/2017   HCT 42.9 06/14/2017   MCV 88.1 06/14/2017   PLT  218 06/14/2017        CMP  Lab Results  Component Value Date   NA 137 06/14/2017   K 4.0 06/14/2017   CL 103 06/14/2017   CO2 25 06/14/2017   GLUCOSE 269 (H) 06/14/2017   BUN 12 06/14/2017   CREATININE 0.96 06/14/2017   CALCIUM 9.2 06/14/2017   GFRNONAA >60 06/14/2017   GFRAA >60 06/14/2017   VA on 05/31/2017: 0.7   Imaging: CT images from 06/06/2017-reviewed with Charles [name] [name] and Charles [name] [name]   Assessment/Plan:   1. Rectal cancer, clinical stage  III  Colonoscopy 05/24/2017, mass at 12 cm from the anal verge, biopsy confirmed invasive poorly differentiated adenocarcinoma  CTs 06/06/2017-rectal mass, perirectal adenopathy, no evidence of metastatic disease, nonspecific 1 cm sclerotic lesion at the right iliac crest  MRI 06/06/2017, T3cN1 tumor measured at 5.3 cm from the anal sphincter  2. Diabetes 3. Glaucoma 4. Cecal polyp, tubular adenoma noted on the colonoscopy 05/24/2017   Disposition:   Charles [name] [name] has been diagnosed with rectal cancer.  He has clinical stage III disease.  I discussed treatment options with [name] and Charles [name] [name].  Charles [name] case was presented at the GI tumor conference on 06/12/2017.  The conference recommendation is to proceed with a total neoadjuvant approach.  I recommend neoadjuvant FOLFOX to be followed by Xeloda/radiation and then surgery.  I reviewed the FOLFOX treatment schedule and potential toxicities in detail with Charles [name] [name] and Charles [name] [name].  He understands the chance for nausea/vomiting, mucositis, diarrhea, alopecia, and hematologic toxicity.  We discussed the rash, sun sensitivity, hyperpigmentation, and hand/foot syndrome associated with 5-fluorouracil.  We reviewed the allergic reaction and various types of neuropathy associated with oxaliplatin.  He agrees to proceed.  He will attend a chemotherapy teaching class.  He understands Charles [name] family members are at increased risk of developing colorectal cancer and should receive appropriate screening.  He will be tested for hereditary non-polyposis colon cancer syndrome.  Charles [name] [name] is scheduled for Port-A-Cath placement 06/17/2017.  He will be scheduled for cycle of FOLFOX on 06/20/2017.  I will ask the research nurses to meet with him to discuss enrollment on the Exact scientist and Guarded blood draws studies.  50 minutes were spent with the patient today.  The majority of the time was used for counseling and coordination of care.  Betsy Coder, MD   06/14/2017, 2:48 PM

## 2017-06-14 NOTE — Telephone Encounter (Signed)
Scheduled appt per 6/7 los - gave patient AVS and calender per los. - per Darlena okay to schedule appt on 6/13 and they will check with insurance.

## 2017-06-16 ENCOUNTER — Other Ambulatory Visit: Payer: Self-pay | Admitting: Oncology

## 2017-06-16 MED ORDER — GENTAMICIN SULFATE 40 MG/ML IJ SOLN
5.0000 mg/kg | INTRAVENOUS | Status: AC
  Administered 2017-06-17: 410 mg via INTRAVENOUS
  Filled 2017-06-16: qty 10.25

## 2017-06-17 ENCOUNTER — Encounter: Payer: Self-pay | Admitting: Pharmacist

## 2017-06-17 ENCOUNTER — Ambulatory Visit (HOSPITAL_COMMUNITY): Payer: 59

## 2017-06-17 ENCOUNTER — Encounter (HOSPITAL_COMMUNITY)
Admission: RE | Disposition: A | Discharge status: Home / Self Care | Payer: Self-pay | Source: Ambulatory Visit | Attending: Surgery

## 2017-06-17 ENCOUNTER — Ambulatory Visit (HOSPITAL_COMMUNITY): Payer: 59 | Admitting: Certified Registered Nurse Anesthetist

## 2017-06-17 ENCOUNTER — Encounter (HOSPITAL_COMMUNITY): Payer: Self-pay | Admitting: *Deleted

## 2017-06-17 ENCOUNTER — Ambulatory Visit (HOSPITAL_COMMUNITY)
Admission: RE | Admit: 2017-06-17 | Discharge: 2017-06-17 | Disposition: A | Discharge status: Home / Self Care | Payer: 59 | Source: Ambulatory Visit | Attending: Surgery | Admitting: Surgery

## 2017-06-17 DIAGNOSIS — E119 Type 2 diabetes mellitus without complications: Secondary | ICD-10-CM | POA: Insufficient documentation

## 2017-06-17 DIAGNOSIS — E785 Hyperlipidemia, unspecified: Secondary | ICD-10-CM | POA: Insufficient documentation

## 2017-06-17 DIAGNOSIS — C20 Malignant neoplasm of rectum: Secondary | ICD-10-CM | POA: Insufficient documentation

## 2017-06-17 DIAGNOSIS — Z452 Encounter for adjustment and management of vascular access device: Secondary | ICD-10-CM | POA: Diagnosis not present

## 2017-06-17 DIAGNOSIS — Z8249 Family history of ischemic heart disease and other diseases of the circulatory system: Secondary | ICD-10-CM | POA: Insufficient documentation

## 2017-06-17 DIAGNOSIS — Z888 Allergy status to other drugs, medicaments and biological substances status: Secondary | ICD-10-CM | POA: Insufficient documentation

## 2017-06-17 DIAGNOSIS — Z807 Family history of other malignant neoplasms of lymphoid, hematopoietic and related tissues: Secondary | ICD-10-CM | POA: Insufficient documentation

## 2017-06-17 DIAGNOSIS — Z95828 Presence of other vascular implants and grafts: Secondary | ICD-10-CM

## 2017-06-17 DIAGNOSIS — I10 Essential (primary) hypertension: Secondary | ICD-10-CM | POA: Insufficient documentation

## 2017-06-17 DIAGNOSIS — Z79899 Other long term (current) drug therapy: Secondary | ICD-10-CM | POA: Insufficient documentation

## 2017-06-17 DIAGNOSIS — E7849 Other hyperlipidemia: Secondary | ICD-10-CM | POA: Diagnosis not present

## 2017-06-17 DIAGNOSIS — Z794 Long term (current) use of insulin: Secondary | ICD-10-CM | POA: Insufficient documentation

## 2017-06-17 HISTORY — PX: PORTACATH PLACEMENT: SHX2246

## 2017-06-17 LAB — GLUCOSE, CAPILLARY
GLUCOSE-CAPILLARY: 108 mg/dL — AB (ref 65–99)
Glucose-Capillary: 118 mg/dL — ABNORMAL HIGH (ref 65–99)

## 2017-06-17 SURGERY — INSERTION, TUNNELED CENTRAL VENOUS DEVICE, WITH PORT
Anesthesia: General | Site: Neck

## 2017-06-17 MED ORDER — LIDOCAINE-EPINEPHRINE 1 %-1:100000 IJ SOLN
INTRAMUSCULAR | Status: DC | PRN
  Administered 2017-06-17: 12 mL

## 2017-06-17 MED ORDER — LIDOCAINE-EPINEPHRINE 1 %-1:100000 IJ SOLN
INTRAMUSCULAR | Status: AC
  Filled 2017-06-17: qty 1

## 2017-06-17 MED ORDER — 0.9 % SODIUM CHLORIDE (POUR BTL) OPTIME
TOPICAL | Status: DC | PRN
  Administered 2017-06-17: 1000 mL

## 2017-06-17 MED ORDER — MIDAZOLAM HCL 2 MG/2ML IJ SOLN
INTRAMUSCULAR | Status: AC
  Filled 2017-06-17: qty 2

## 2017-06-17 MED ORDER — FENTANYL CITRATE (PF) 100 MCG/2ML IJ SOLN
INTRAMUSCULAR | Status: DC | PRN
  Administered 2017-06-17 (×4): 50 ug via INTRAVENOUS

## 2017-06-17 MED ORDER — FENTANYL CITRATE (PF) 100 MCG/2ML IJ SOLN
INTRAMUSCULAR | Status: AC
  Administered 2017-06-17: 50 ug via INTRAVENOUS
  Filled 2017-06-17: qty 2

## 2017-06-17 MED ORDER — MEPERIDINE HCL 50 MG/ML IJ SOLN: 6.2500 mg | INTRAMUSCULAR | Status: DC | PRN

## 2017-06-17 MED ORDER — HEPARIN SOD (PORK) LOCK FLUSH 100 UNIT/ML IV SOLN
INTRAVENOUS | Status: AC
  Filled 2017-06-17: qty 5

## 2017-06-17 MED ORDER — OXYCODONE HCL 5 MG/5ML PO SOLN
5.0000 mg | Freq: Once | ORAL | Status: DC | PRN
  Filled 2017-06-17: qty 5

## 2017-06-17 MED ORDER — FENTANYL CITRATE (PF) 100 MCG/2ML IJ SOLN
25.0000 ug | INTRAMUSCULAR | Status: DC | PRN
  Administered 2017-06-17: 50 ug via INTRAVENOUS

## 2017-06-17 MED ORDER — ONDANSETRON HCL 4 MG/2ML IJ SOLN
INTRAMUSCULAR | Status: AC
  Filled 2017-06-17: qty 2

## 2017-06-17 MED ORDER — LACTATED RINGERS IV SOLN
INTRAVENOUS | Status: DC
  Administered 2017-06-17 (×3): via INTRAVENOUS

## 2017-06-17 MED ORDER — PHENYLEPHRINE 40 MCG/ML (10ML) SYRINGE FOR IV PUSH (FOR BLOOD PRESSURE SUPPORT)
PREFILLED_SYRINGE | INTRAVENOUS | Status: DC | PRN
  Administered 2017-06-17: 80 ug via INTRAVENOUS
  Administered 2017-06-17: 120 ug via INTRAVENOUS

## 2017-06-17 MED ORDER — TRAMADOL HCL 50 MG PO TABS: 50.0000 mg | ORAL_TABLET | Freq: Four times a day (QID) | ORAL | 0 refills | Status: DC | PRN

## 2017-06-17 MED ORDER — HEPARIN SOD (PORK) LOCK FLUSH 100 UNIT/ML IV SOLN
INTRAVENOUS | Status: DC | PRN
  Administered 2017-06-17: 500 [IU]

## 2017-06-17 MED ORDER — PROPOFOL 10 MG/ML IV BOLUS
INTRAVENOUS | Status: AC
Start: 2017-06-17 — End: ?
  Filled 2017-06-17: qty 20

## 2017-06-17 MED ORDER — ONDANSETRON HCL 4 MG/2ML IJ SOLN
4.0000 mg | Freq: Once | INTRAMUSCULAR | Status: AC | PRN
  Administered 2017-06-17: 4 mg via INTRAVENOUS

## 2017-06-17 MED ORDER — OXYCODONE HCL 5 MG PO TABS: 5.0000 mg | ORAL_TABLET | Freq: Once | ORAL | Status: DC | PRN

## 2017-06-17 MED ORDER — CLINDAMYCIN PHOSPHATE 900 MG/50ML IV SOLN
900.0000 mg | INTRAVENOUS | Status: AC
  Administered 2017-06-17: 900 mg via INTRAVENOUS
  Filled 2017-06-17: qty 50

## 2017-06-17 MED ORDER — PHENYLEPHRINE 40 MCG/ML (10ML) SYRINGE FOR IV PUSH (FOR BLOOD PRESSURE SUPPORT)
PREFILLED_SYRINGE | INTRAVENOUS | Status: AC
Start: 2017-06-17 — End: ?
  Filled 2017-06-17: qty 10

## 2017-06-17 MED ORDER — ACETAMINOPHEN 500 MG PO TABS
1000.0000 mg | ORAL_TABLET | ORAL | Status: AC
  Administered 2017-06-17: 1000 mg via ORAL
  Filled 2017-06-17: qty 2

## 2017-06-17 MED ORDER — IOPAMIDOL (ISOVUE-300) INJECTION 61%
INTRAVENOUS | Status: AC
  Filled 2017-06-17: qty 50

## 2017-06-17 MED ORDER — IOPAMIDOL (ISOVUE-300) INJECTION 61%
INTRAVENOUS | Status: DC | PRN
  Administered 2017-06-17: 15 mL via INTRAVENOUS

## 2017-06-17 MED ORDER — CELECOXIB 200 MG PO CAPS
200.0000 mg | ORAL_CAPSULE | ORAL | Status: AC
  Administered 2017-06-17: 200 mg via ORAL
  Filled 2017-06-17: qty 1

## 2017-06-17 MED ORDER — SODIUM CHLORIDE 0.9 % IV SOLN
Freq: Once | INTRAVENOUS | Status: AC
  Administered 2017-06-17: 50 mL
  Filled 2017-06-17: qty 1.2

## 2017-06-17 MED ORDER — GABAPENTIN 300 MG PO CAPS
300.0000 mg | ORAL_CAPSULE | ORAL | Status: AC
  Administered 2017-06-17: 300 mg via ORAL
  Filled 2017-06-17: qty 1

## 2017-06-17 MED ORDER — ACETAMINOPHEN 325 MG PO TABS: 325.0000 mg | ORAL_TABLET | ORAL | Status: DC | PRN

## 2017-06-17 MED ORDER — PROPOFOL 10 MG/ML IV BOLUS
INTRAVENOUS | Status: DC | PRN
  Administered 2017-06-17: 200 mg via INTRAVENOUS

## 2017-06-17 MED ORDER — SODIUM CHLORIDE 0.9 % IV SOLN
Freq: Once | INTRAVENOUS | Status: DC
  Filled 2017-06-17 (×2): qty 1.2

## 2017-06-17 MED ORDER — LIDOCAINE 2% (20 MG/ML) 5 ML SYRINGE
INTRAMUSCULAR | Status: DC | PRN
  Administered 2017-06-17: 60 mg via INTRAVENOUS

## 2017-06-17 MED ORDER — ACETAMINOPHEN 160 MG/5ML PO SOLN: 325.0000 mg | ORAL | Status: DC | PRN

## 2017-06-17 MED ORDER — ONDANSETRON HCL 4 MG/2ML IJ SOLN
INTRAMUSCULAR | Status: DC | PRN
  Administered 2017-06-17: 4 mg via INTRAVENOUS

## 2017-06-17 MED ORDER — FENTANYL CITRATE (PF) 250 MCG/5ML IJ SOLN
INTRAMUSCULAR | Status: AC
  Filled 2017-06-17: qty 5

## 2017-06-17 MED ORDER — MIDAZOLAM HCL 5 MG/5ML IJ SOLN
INTRAMUSCULAR | Status: DC | PRN
  Administered 2017-06-17: 2 mg via INTRAVENOUS

## 2017-06-17 SURGICAL SUPPLY — 41 items
BAG DECANTER FOR FLEXI CONT (MISCELLANEOUS) ×2 IMPLANT
BENZOIN TINCTURE PRP APPL 2/3 (GAUZE/BANDAGES/DRESSINGS) ×2 IMPLANT
BLADE HEX COATED 2.75 (ELECTRODE) ×2 IMPLANT
BLADE SURG 15 STRL LF DISP TIS (BLADE) ×1 IMPLANT
BLADE SURG 15 STRL SS (BLADE) ×1
BLADE SURG SZ11 CARB STEEL (BLADE) ×2 IMPLANT
CHLORAPREP W/TINT 26ML (MISCELLANEOUS) ×2 IMPLANT
COVER PROBE U/S 5X48 (MISCELLANEOUS) ×2 IMPLANT
DECANTER SPIKE VIAL GLASS SM (MISCELLANEOUS) ×2 IMPLANT
DERMABOND ADVANCED (GAUZE/BANDAGES/DRESSINGS) ×1
DERMABOND ADVANCED .7 DNX12 (GAUZE/BANDAGES/DRESSINGS) ×1 IMPLANT
DRAPE C-ARM 42X120 X-RAY (DRAPES) ×2 IMPLANT
DRAPE LAPAROSCOPIC ABDOMINAL (DRAPES) ×2 IMPLANT
DRSG TEGADERM 2-3/8X2-3/4 SM (GAUZE/BANDAGES/DRESSINGS) ×2 IMPLANT
DRSG TEGADERM 4X4.75 (GAUZE/BANDAGES/DRESSINGS) ×2 IMPLANT
ELECT PENCIL ROCKER SW 15FT (MISCELLANEOUS) ×2 IMPLANT
ELECT REM PT RETURN 15FT ADLT (MISCELLANEOUS) ×2 IMPLANT
GAUZE 4X4 16PLY RFD (DISPOSABLE) ×2 IMPLANT
GAUZE SPONGE 4X4 12PLY STRL (GAUZE/BANDAGES/DRESSINGS) ×2 IMPLANT
GLOVE BIOGEL M STRL SZ7.5 (GLOVE) ×2 IMPLANT
GLOVE ECLIPSE 8.0 STRL XLNG CF (GLOVE) ×2 IMPLANT
GLOVE INDICATOR 8.0 STRL GRN (GLOVE) ×2 IMPLANT
GOWN STRL REUS W/TWL XL LVL3 (GOWN DISPOSABLE) ×4 IMPLANT
KIT BASIN OR (CUSTOM PROCEDURE TRAY) ×2 IMPLANT
KIT PORT POWER 8FR ISP CVUE (Port) ×2 IMPLANT
NEEDLE HYPO 25X1 1.5 SAFETY (NEEDLE) ×2 IMPLANT
NS IRRIG 1000ML POUR BTL (IV SOLUTION) ×2 IMPLANT
PACK BASIC VI WITH GOWN DISP (CUSTOM PROCEDURE TRAY) ×2 IMPLANT
STRIP CLOSURE SKIN 1/2X4 (GAUZE/BANDAGES/DRESSINGS) ×2 IMPLANT
SUT MNCRL AB 4-0 PS2 18 (SUTURE) ×2 IMPLANT
SUT PROLENE 2 0 SH DA (SUTURE) ×4 IMPLANT
SUT VIC AB 2-0 SH 18 (SUTURE) IMPLANT
SUT VIC AB 2-0 SH 27 (SUTURE)
SUT VIC AB 2-0 SH 27X BRD (SUTURE) IMPLANT
SUT VIC AB 3-0 SH 27 (SUTURE) ×1
SUT VIC AB 3-0 SH 27XBRD (SUTURE) ×1 IMPLANT
SYR 10ML LL (SYRINGE) ×2 IMPLANT
SYR 20CC LL (SYRINGE) ×2 IMPLANT
SYR CONTROL 10ML LL (SYRINGE) ×2 IMPLANT
TOWEL OR 17X26 10 PK STRL BLUE (TOWEL DISPOSABLE) ×2 IMPLANT
TOWEL OR NON WOVEN STRL DISP B (DISPOSABLE) ×2 IMPLANT

## 2017-06-17 NOTE — Anesthesia Postprocedure Evaluation (Signed)
Anesthesia Post Note  Patient: Charles [name]  Procedure(s) Performed: RIGHT VS LEFT INTERNAL JUGULAR PORT-A-CATH PLACEMENT WITH ULTRASOUND (N/A Neck)     Patient location during evaluation: PACU Anesthesia Type: General Level of consciousness: awake and alert Pain management: pain level controlled Vital Signs Assessment: post-procedure vital signs reviewed and stable Respiratory status: spontaneous breathing, nonlabored ventilation and respiratory function stable Cardiovascular status: blood pressure returned to baseline and stable Postop Assessment: no apparent nausea or vomiting Anesthetic complications: no    Last Vitals:  Vitals:   06/17/17 1645 06/17/17 1700  BP: 114/68 116/67  Pulse: 93 95  Resp: 14 15  Temp:  36.4 C  SpO2: 94% 100%    Last Pain:  Vitals:   06/17/17 1700  TempSrc:   PainSc: 3                  Lynda Rainwater

## 2017-06-17 NOTE — Transfer of Care (Signed)
Immediate Anesthesia Transfer of Care Note  Patient: Charles [name]  Procedure(s) Performed: RIGHT VS LEFT INTERNAL JUGULAR PORT-A-CATH PLACEMENT WITH ULTRASOUND (N/A Neck)  Patient Location: PACU  Anesthesia Type:General  Level of Consciousness: sedated, patient cooperative and responds to stimulation  Airway & Oxygen Therapy: Patient Spontanous Breathing and Patient connected to face mask oxygen  Post-op Assessment: Report given to RN and Post -op Vital signs reviewed and stable  Post vital signs: Reviewed and stable  Last Vitals:  Vitals Value Taken Time  BP 128/74 06/17/2017  4:11 PM  Temp    Pulse 95 06/17/2017  4:13 PM  Resp 13 06/17/2017  4:12 PM  SpO2 95 % 06/17/2017  4:13 PM  Vitals shown include unvalidated device data.  Last Pain:  Vitals:   06/17/17 1347  TempSrc:   PainSc: 0-No pain         Complications: No apparent anesthesia complications

## 2017-06-17 NOTE — H&P (Signed)
CC: Newly diagnosed rectal cancer, referred by Dr. Michail Sermon - here for f/u regarding imaging  HPI: [name] is a very pleasant 52 year old gentleman with history of hypertension, hyperlipidemia, diabetes here for evaluation of a newly diagnosed rectal cancer. He underwent a colonoscopy with Dr. Michail Sermon 05/24/17 which demonstrated a likely malignant tumor in the rectosigmoid colon at 12 cm proximal to the anus. Biopsied and tattooed. He also had 15 and only a polyp in the cecum removed with a hot snare. Internal hemorrhoids. We do not have a copy of the pathology report this time and has sent a request to Dr. Michail Sermon for this. This colonoscopy was done for the purposes of rectal bleeding when she had the last month. He had had no prior colonoscopy. He denies any issues with abdominal pain unless he saw a spicy foods. He denies any nausea or vomiting unless he's on spicy foods. He is having 3-4 bowel movements per day. They're intermittently blood tinged.   INTERVAL HISTORY He denies any new complaints today. He denies abdominal pain. He is having bowel movements. He denies blood in his stool. He is tolerating a diet without nausea or vomiting.  CT C/A/P 06/06/17 demonstrated an eccentric hyperenhancing upper left rectal 2.9 cm mass. 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 No definite additional sites of metastatic disease. Solitary sclerotic lesion in the superior right iliac crest nonspecific.  MRI Pelvis with/without contrast 06/06/17 Demonstrated rectal adenocarcinoma T3c N1, 5.3 cm from the anal sphincter by MRI - interpreted as mid rectum  CEA 0.7   PMH: HTN, HLD, DM (on Victoza and insulin)  PSH: Open appendectomy; right achilles; open inguinal hernia repair as infant x2  FHx: Aunt had lymphoma; Mother had cervical cancer; paternal family history unknown  Social: Denies use of tobacco/drugs;  social EtOH  ROS: A comprehensive 10 system review of systems was completed with the patient and pertinent findings as noted above.  Past Medical History:  Diagnosis Date  . Diabetes mellitus without complication ([place])    Type II  . Hyperlipidemia     Past Surgical History:  Procedure Laterality Date  . ACHILLES TENDON REPAIR Right 2005  . APPENDECTOMY    . HERNIA REPAIR     as a baby    Family History  Problem Relation Age of Onset  . Hypertension Mother   . Hyperlipidemia Mother   . Cancer Paternal Aunt   . Non-Hodgkin's lymphoma Paternal Aunt     Social:  reports that he has never smoked. He has never used smokeless tobacco. He reports that he drinks alcohol. He reports that he does not use drugs.  Allergies:  Allergies  Allergen Reactions  . Cefradine [Cephradine]     Gi upset     Medications: I have reviewed the patient's current medications.  Results for orders placed or performed during the hospital encounter of 06/17/17 (from the past 48 hour(s))  Glucose, capillary     Status: Abnormal   Collection Time: 06/17/17  1:15 PM  Result Value Ref Range   Glucose-Capillary 118 (H) 65 - 99 mg/dL    No results found.  ROS - all of the below systems have been reviewed with the patient and positives are indicated with bold text General: chills, fever or night sweats Eyes: blurry vision or double vision ENT: epistaxis or sore throat Allergy/Immunology: itchy/watery eyes or nasal congestion Hematologic/Lymphatic: bleeding problems, blood clots or swollen lymph nodes Endocrine: temperature intolerance or  unexpected weight changes Breast: new or changing breast lumps or nipple discharge Resp: cough, shortness of breath, or wheezing CV: chest pain or dyspnea on exertion GI: as per HPI GU: dysuria, trouble voiding, or hematuria MSK: joint pain or joint stiffness Neuro: TIA or stroke symptoms Derm: pruritus and skin lesion changes Psych: anxiety and  depression  PE Blood pressure 131/71, pulse (!) 101, temperature 99.5 F (37.5 C), temperature source Oral, resp. rate 18, height 5\' 8"  (1.727 m), weight 102.5 kg (226 lb), SpO2 100 %. Constitutional: NAD; conversant; no deformities Eyes: Moist conjunctiva; no lid lag; anicteric; PERRL Neck: Trachea midline; no thyromegaly Lungs: Normal respiratory effort; no tactile fremitus CV: RRR; no palpable thrills; no pitting edema GI: Abd soft, NT/ND; no palpable hepatosplenomegaly MSK: Normal gait; no clubbing/cyanosis Psychiatric: Appropriate affect; alert and oriented x3 Lymphatic: No palpable cervical or axillary lymphadenopathy  Results for orders placed or performed during the hospital encounter of 06/17/17 (from the past 48 hour(s))  Glucose, capillary     Status: Abnormal   Collection Time: 06/17/17  1:15 PM  Result Value Ref Range   Glucose-Capillary 118 (H) 65 - 99 mg/dL   A/P: Charles [name] is a very pleasant 52yoM with HTN, HLD, DM here today for f/u - cT3cN1M0 rectal cancer - 12cm from verge on scope, 5.3cm from verge on MRI; not palpable on physical exam; CEA 0.7  -He has seen medical oncology who is proceeding with total neoadjuvant approach and have requested port a cath placement. -The anatomy and physiology of the vascular system was discussed at length with the patient with associated pictures. -The planned procedure, material risks (including, but not limited to, pain, bleeding, infection, scarring, need for blood transfusion, damage to surrounding structures- blood vessels/arteries, pneumothorax, hemothorax, need for additional procedures, heart attack, stroke, death) benefits and alternatives to surgery were discussed at length. The patient's questions were answered to his satisfaction, he voiced understanding and they elected to proceed with surgery. Additionally, we discussed typical postoperative expectations and the recovery process.  Sharon Mt. Dema Severin, M.D. [facility]

## 2017-06-17 NOTE — Discharge Instructions (Addendum)
POST OP INSTRUCTIONS ° °1. DIET: As tolerated. Follow a light bland diet the first 24 hours after arrival home, such as soup, liquids, crackers, etc.  Be sure to include lots of fluids daily.  Avoid fast food or heavy meals as your are more likely to get nauseated.  Eat a low fat the next few days after surgery. ° °2. Take your usually prescribed home medications unless otherwise directed. ° °3. PAIN CONTROL: °a. Pain is best controlled by a usual combination of three different methods TOGETHER: °i. Ice/Heat °ii. Over the counter pain medication °iii. Prescription pain medication °b. Most patients will experience some swelling and bruising around the surgical site.  Ice packs or heating pads (30-60 minutes up to 6 times a day) will help. Some people prefer to use ice alone, heat alone, alternating between ice & heat.  Experiment to what works for you.  Swelling and bruising can take several weeks to resolve.   °c. It is helpful to take an over-the-counter pain medication regularly for the first few weeks: °i. Ibuprofen (Motrin/Advil) - 200mg tabs - take 3 tabs (600mg) every 6 hours as needed for pain °ii. Acetaminophen (Tylenol) - you may take 650mg every 6 hours as needed. You can take this with motrin as they act differently on the body. If you are taking a narcotic pain medication that has acetaminophen in it, do not take over the counter tylenol at the same time. ° Iii. NOTE: You may take both of these medications together - most patients  find it most helpful when alternating between the two (i.e. Ibuprofen at 6am, tylenol at 9am, ibuprofen at 12pm ...) °d. A  prescription for pain medication should be given to you upon discharge.  Take your pain medication as prescribed if your pain is not adequatly controlled with the over-the-counter pain reliefs mentioned above. ° °4. Avoid getting constipated.  Between the surgery and the pain medications, it is common to experience some constipation.  Increasing fluid  intake and taking a fiber supplement (such as Metamucil, Citrucel, FiberCon, MiraLax, etc) 1-2 times a day regularly will usually help prevent this problem from occurring.  A mild laxative (prune juice, Milk of Magnesia, MiraLax, etc) should be taken according to package directions if there are no bowel movements after 48 hours.   ° °5. Dressing: Your incision is covered in Dermabond which is like sterile superglue for the skin. This will come off on it's own in a couple weeks. It is waterproof and you may bathe normally starting the day after your surgery in a shower. Avoid baths/pools/lakes/oceans until your wounds have fully healed. ° °6. ACTIVITIES as tolerated:   °a. Avoid heavy lifting (>10lbs or 1 gallon of milk) for the next 6 weeks. °b. You may resume regular (light) daily activities beginning the next day--such as daily self-care, walking, climbing stairs--gradually increasing activities as tolerated.  If you can walk 30 minutes without difficulty, it is safe to try more intense activity such as jogging, treadmill, bicycling, low-impact aerobics.  °c. DO NOT PUSH THROUGH PAIN.  Let pain be your guide: If it hurts to do something, don't do it. °d. You may drive when you are no longer taking prescription pain medication, you can comfortably wear a seatbelt, and you can safely maneuver your car and apply brakes. ° ° °7. FOLLOW UP in our office °a. Please call [facility] at [phone] to set up an appointment to see your surgeon in the office for a follow-up appointment   approximately 2 weeks after your surgery. °b. Make sure that you call for this appointment the day you arrive home to insure a convenient appointment time. ° °9. If you have disability or family leave forms that need to be completed, you may have them completed by your primary care physician's office; for return to work instructions, please ask our office staff and they will be happy to assist you in obtaining this documentation °  °When to call  us [phone]: °1. Poor pain control °2. Reactions / problems with new medications (rash/itching, etc)  °3. Fever over 101.5 F (38.5 C) °4. Inability to urinate °5. Nausea/vomiting °6. Worsening swelling or bruising °7. Continued bleeding from incision. °8. Increased pain, redness, or drainage from the incision ° °The clinic staff is available to answer your questions during regular business hours (8:30am-5pm).  Please don’t hesitate to call and ask to speak to one of our nurses for clinical concerns.   A surgeon from [facility] is always on call at the hospitals °  °If you have a medical emergency, go to the nearest emergency room or call 911. ° °[facility] °[address], [address], [place], [place]  [address] °MAIN: [phone] °FAX: [phone] °[email or web address] °

## 2017-06-17 NOTE — Progress Notes (Signed)
Referral to [facility] for prehab placed. Patient aware.

## 2017-06-17 NOTE — Anesthesia Procedure Notes (Signed)
Procedure Name: LMA Insertion Performed by: Krizia Flight J, CRNA Pre-anesthesia Checklist: Patient identified, Emergency Drugs available, Suction available, Patient being monitored and Timeout performed Patient Re-evaluated:Patient Re-evaluated prior to induction Oxygen Delivery Method: Circle system utilized Preoxygenation: Pre-oxygenation with 100% oxygen Induction Type: IV induction Ventilation: Mask ventilation without difficulty LMA: LMA inserted LMA Size: 5.0 Number of attempts: 1 Placement Confirmation: positive ETCO2,  CO2 detector and breath sounds checked- equal and bilateral Tube secured with: Tape Dental Injury: Teeth and Oropharynx as per pre-operative assessment        

## 2017-06-17 NOTE — Anesthesia Preprocedure Evaluation (Addendum)
Anesthesia Evaluation  Patient identified by MRN, date of birth, ID band Patient awake    Reviewed: Allergy & Precautions, H&P , NPO status , Patient's Chart, lab work & pertinent test results, reviewed documented beta blocker date and time   Airway Mallampati: II  TM Distance: >3 FB Neck ROM: full    Dental no notable dental hx.    Pulmonary neg pulmonary ROS,    Pulmonary exam normal breath sounds clear to auscultation       Cardiovascular Exercise Tolerance: Good negative cardio ROS   Rhythm:regular Rate:Normal     Neuro/Psych negative neurological ROS  negative psych ROS   GI/Hepatic negative GI ROS, Neg liver ROS,   Endo/Other  negative endocrine ROSdiabetes, Insulin Dependent  Renal/GU negative Renal ROS  negative genitourinary   Musculoskeletal   Abdominal   Peds  Hematology negative hematology ROS (+)   Anesthesia Other Findings   Reproductive/Obstetrics negative OB ROS                            Anesthesia Physical Anesthesia Plan  ASA: II  Anesthesia Plan: General   Post-op Pain Management:    Induction:   PONV Risk Score and Plan: 2 and Ondansetron and Treatment may vary due to age or medical condition  Airway Management Planned: Oral ETT and LMA  Additional Equipment:   Intra-op Plan:   Post-operative Plan: Extubation in OR  Informed Consent: I have reviewed the patients History and Physical, chart, labs and discussed the procedure including the risks, benefits and alternatives for the proposed anesthesia with the patient or authorized representative who has indicated his/her understanding and acceptance.   Dental Advisory Given  Plan Discussed with: CRNA, Anesthesiologist and Surgeon  Anesthesia Plan Comments:         Anesthesia Quick Evaluation

## 2017-06-17 NOTE — Op Note (Signed)
06/17/2017  4:02 PM  PATIENT:  Charles [name]  52 y.o. male  Patient Care Team: Lujean Amel, MD as PCP - General (Family Medicine)  PRE-OPERATIVE DIAGNOSIS:  Rectal cancer  POST-OPERATIVE DIAGNOSIS:  Rectal cancer  PROCEDURE:  Placement of right internal jugular port-a-cath with fluoroscopy  SURGEON:  Sharon Mt. Kylar Speelman, MD  ASSISTANT: None  ANESTHESIA:   general  COUNTS:  Sponge, needle and instrument counts were reported correct x2 at the conclusion of the operation.  EBL: 10cc  DRAINS: None  SPECIMEN: None  COMPLICATIONS: None  FINDINGS: Standard venous anatomy  DISPOSITION: PACU in satisfactory condition  INDICATION:  Charles [name] is a very pleasant 52 year old gentleman with locally advanced rectal cancer. His case was presented at our multidisciplinary tumor board and the recommendation was to pursue total neoadjuvant therapy with systemic therapy first followed by chemoradiation.  DESCRIPTION: The patient was identified in preop holding and taken to the OR where they were placed on the operating room table and SCDs were placed. General endotracheal anesthesia was induced without difficulty. The patient was then prepped and draped in the usual sterile fashion. A surgical timeout was performed indicating the correct patient, procedure, positioning and need for preoperative antibiotics.   The right neck was examined with an ultrasound.  The right internal jugular vein was identified.  The patient was then placed in Trendelenburg.  The vein was well distended.  Under direct ultrasound guidance, the right internal jugular vein was accessed on the first attempt.  Dark red blood was aspirated.  A J-wire was then advanced.  The introducer needle was removed.  The J-wire was confirmed to be within the lumen of the right internal jugular vein and traced down to and just below the clavicle.  With fluoroscopy, the wire position was examined and noted to be within the right  hemithorax in an appropriate position consistent with a venous path.  The neck incision was slightly enlarged.  The introducer sheath which was a peel-away sheath was then advanced under fluoroscopic visualization.  The J-wire and dilator were then removed.  The port and tubing were confirmed to be working appropriately.  The tubing was then advanced down the sheath and into the right atrium.  The peel-away sheath was then cracked and peeled away.  Attention was then turned to creating the port pocket.  Below the right clavicle, a transverse skin incision was made.  The pocket was developed through the subcutaneous tissue down to the level of the pectoralis fascia and inferiorly from the incision.  A tunneling device was then used to pass the tubing from the right neck incision to the port site.  2-0 Prolene sutures were then used to secure the port to the underlying pectoralis fascia and left untied.  Under fluoroscopic guidance, the tubing was pulled back until the tip of the tubing set at the cavoatrial junction.  The tubing was then cut to the appropriate size and secured to the port with the plastic locking.  Sutures were then carefully tied securing the port to the pectoralis fascia.  The port was then accessed and aspirated.  Dark blood was freely aspirated.  The port was then flushed with heparinized saline - the volume of the port/tubing was noted to be 1.5cc.  The port was then locked with 1.5 cc of heparin.  The pocket was examined and noted to be hemostatic.  The pocket incision was closed in 2 layers with a 3-0 Vicryl suture followed by running 4-0 Monocryl subcuticular suture.  The neck incision was approximated with 4-0 Monocryl subcuticular suture, taking care not to puncture the underlying port tubing.  The skin of all incision sites was covered with Dermabond.  The patient was then awakened from anesthesia, extubated, and transferred to a stretcher for transport to PACU in satisfactory condition. A  post-procedure film was obtained and showed satisfactory position of the port, no pneumothorax.

## 2017-06-18 ENCOUNTER — Encounter (HOSPITAL_COMMUNITY): Payer: Self-pay | Admitting: Surgery

## 2017-06-19 ENCOUNTER — Inpatient Hospital Stay: Payer: 59

## 2017-06-19 ENCOUNTER — Inpatient Hospital Stay: Payer: 59 | Admitting: *Deleted

## 2017-06-20 ENCOUNTER — Inpatient Hospital Stay: Payer: 59

## 2017-06-20 ENCOUNTER — Other Ambulatory Visit: Payer: Self-pay | Admitting: *Deleted

## 2017-06-20 ENCOUNTER — Encounter (HOSPITAL_COMMUNITY): Payer: Self-pay | Admitting: Surgery

## 2017-06-20 DIAGNOSIS — C20 Malignant neoplasm of rectum: Secondary | ICD-10-CM

## 2017-06-20 DIAGNOSIS — Z5111 Encounter for antineoplastic chemotherapy: Secondary | ICD-10-CM | POA: Diagnosis not present

## 2017-06-20 DIAGNOSIS — Z95828 Presence of other vascular implants and grafts: Secondary | ICD-10-CM | POA: Insufficient documentation

## 2017-06-20 LAB — CMP (CANCER CENTER ONLY)
ALBUMIN: 4 g/dL (ref 3.5–5.0)
ALT: 21 U/L (ref 0–55)
AST: 15 U/L (ref 5–34)
Alkaline Phosphatase: 120 U/L (ref 40–150)
Anion gap: 11 (ref 3–11)
BILIRUBIN TOTAL: 0.4 mg/dL (ref 0.2–1.2)
BUN: 13 mg/dL (ref 7–26)
CALCIUM: 10.2 mg/dL (ref 8.4–10.4)
CO2: 24 mmol/L (ref 22–29)
Chloride: 104 mmol/L (ref 98–109)
Creatinine: 1 mg/dL (ref 0.70–1.30)
GFR, Est AFR Am: >60 mL/min (ref >60)
GFR, Estimated: >60 mL/min (ref >60)
GLUCOSE: 134 mg/dL (ref 70–140)
Potassium: 3.6 mmol/L (ref 3.5–5.1)
Sodium: 139 mmol/L (ref 136–145)
TOTAL PROTEIN: 7.9 g/dL (ref 6.4–8.3)

## 2017-06-20 LAB — CBC WITH DIFFERENTIAL (CANCER CENTER ONLY)
BASOS PCT: 0 %
Basophils Absolute: 0 10*3/uL (ref 0.0–0.1)
Eosinophils Absolute: 0.1 10*3/uL (ref 0.0–0.5)
Eosinophils Relative: 2 %
HCT: 43.3 % (ref 38.4–49.9)
HEMOGLOBIN: 14.5 g/dL (ref 13.0–17.1)
Lymphocytes Relative: 25 %
Lymphs Abs: 1.6 10*3/uL (ref 0.9–3.3)
MCH: 29.4 pg (ref 27.2–33.4)
MCHC: 33.5 g/dL (ref 32.0–36.0)
MCV: 88 fL (ref 79.3–98.0)
MONOS PCT: 9 %
Monocytes Absolute: 0.6 10*3/uL (ref 0.1–0.9)
Neutro Abs: 4.1 10*3/uL (ref 1.5–6.5)
Neutrophils Relative %: 64 %
Platelet Count: 211 10*3/uL (ref 140–400)
RBC: 4.93 MIL/uL (ref 4.20–5.82)
RDW: 13.1 % (ref 11.0–14.6)
WBC Count: 6.5 10*3/uL (ref 4.0–10.3)

## 2017-06-20 MED ORDER — LIDOCAINE-PRILOCAINE 2.5-2.5 % EX CREA: 1.0000 "application " | TOPICAL_CREAM | CUTANEOUS | 0 refills | Status: DC | PRN

## 2017-06-20 MED ORDER — DEXAMETHASONE SODIUM PHOSPHATE 10 MG/ML IJ SOLN
10.0000 mg | Freq: Once | INTRAMUSCULAR | Status: AC
  Administered 2017-06-20: 10 mg via INTRAVENOUS

## 2017-06-20 MED ORDER — PROCHLORPERAZINE MALEATE 10 MG PO TABS: 10.0000 mg | ORAL_TABLET | Freq: Four times a day (QID) | ORAL | 0 refills | Status: DC | PRN

## 2017-06-20 MED ORDER — OXALIPLATIN CHEMO INJECTION 100 MG/20ML
89.0000 mg/m2 | Freq: Once | INTRAVENOUS | Status: AC
  Administered 2017-06-20: 200 mg via INTRAVENOUS
  Filled 2017-06-20: qty 40

## 2017-06-20 MED ORDER — DEXTROSE 5 % IV SOLN
400.0000 mg/m2 | Freq: Once | INTRAVENOUS | Status: AC
  Administered 2017-06-20: 888 mg via INTRAVENOUS
  Filled 2017-06-20: qty 44.4

## 2017-06-20 MED ORDER — FLUOROURACIL CHEMO INJECTION 5 GM/100ML
2400.0000 mg/m2 | INTRAVENOUS | Status: DC
  Administered 2017-06-20: 5350 mg via INTRAVENOUS
  Filled 2017-06-20: qty 100

## 2017-06-20 MED ORDER — DEXAMETHASONE SODIUM PHOSPHATE 10 MG/ML IJ SOLN
INTRAMUSCULAR | Status: AC
  Filled 2017-06-20: qty 1

## 2017-06-20 MED ORDER — PALONOSETRON HCL INJECTION 0.25 MG/5ML
INTRAVENOUS | Status: AC
  Filled 2017-06-20: qty 5

## 2017-06-20 MED ORDER — SODIUM CHLORIDE 0.9% FLUSH
10.0000 mL | INTRAVENOUS | Status: DC | PRN
Start: 2017-06-20 — End: 2017-06-20
  Administered 2017-06-20: 10 mL
  Filled 2017-06-20: qty 10

## 2017-06-20 MED ORDER — DEXTROSE 5 % IV SOLN
Freq: Once | INTRAVENOUS | Status: AC
  Administered 2017-06-20: 14:00:00 via INTRAVENOUS

## 2017-06-20 MED ORDER — FLUOROURACIL CHEMO INJECTION 2.5 GM/50ML
400.0000 mg/m2 | Freq: Once | INTRAVENOUS | Status: AC
  Administered 2017-06-20: 900 mg via INTRAVENOUS
  Filled 2017-06-20: qty 18

## 2017-06-20 MED ORDER — PALONOSETRON HCL INJECTION 0.25 MG/5ML
0.2500 mg | Freq: Once | INTRAVENOUS | Status: AC
  Administered 2017-06-20: 0.25 mg via INTRAVENOUS

## 2017-06-20 NOTE — Progress Notes (Signed)
Per Dr. Benay Spice okay to waste remaining chemotherapy, 5FU, on Saturday 06/22/2017.

## 2017-06-20 NOTE — Patient Instructions (Signed)
[facility] Discharge Instructions for Patients Receiving Chemotherapy  Today you received the following chemotherapy agents: Leucovorin, Oxaliplatin, and 5FU (Fluorouracil).  To help prevent nausea and vomiting after your treatment, we encourage you to take your nausea medication as directed.   If you develop nausea and vomiting that is not controlled by your nausea medication, call the clinic.   BELOW ARE SYMPTOMS THAT SHOULD BE REPORTED IMMEDIATELY:  *FEVER GREATER THAN 100.5 F  *CHILLS WITH OR WITHOUT FEVER  NAUSEA AND VOMITING THAT IS NOT CONTROLLED WITH YOUR NAUSEA MEDICATION  *UNUSUAL SHORTNESS OF BREATH  *UNUSUAL BRUISING OR BLEEDING  TENDERNESS IN MOUTH AND THROAT WITH OR WITHOUT PRESENCE OF ULCERS  *URINARY PROBLEMS  *BOWEL PROBLEMS  UNUSUAL RASH Items with * indicate a potential emergency and should be followed up as soon as possible.  Feel free to call the clinic should you have any questions or concerns. The clinic phone number is [phone].  Please show the CHEMO ALERT CARD at check-in to the Emergency Department and triage nurse.   Oxaliplatin Injection What is this medicine? OXALIPLATIN (ox AL i PLA tin) is a chemotherapy drug. It targets fast dividing cells, like cancer cells, and causes these cells to die. This medicine is used to treat cancers of the colon and rectum, and many other cancers. This medicine may be used for other purposes; ask your health care provider or pharmacist if you have questions. COMMON BRAND NAME(S): Eloxatin What should I tell my health care provider before I take this medicine? They need to know if you have any of these conditions: -kidney disease -an unusual or allergic reaction to oxaliplatin, other chemotherapy, other medicines, foods, dyes, or preservatives -pregnant or trying to get pregnant -breast-feeding How should I use this medicine? This drug is given as an infusion into a vein. It is  administered in a hospital or clinic by a specially trained health care professional. Talk to your pediatrician regarding the use of this medicine in children. Special care may be needed. Overdosage: If you think you have taken too much of this medicine contact a poison control center or emergency room at once. NOTE: This medicine is only for you. Do not share this medicine with others. What if I miss a dose? It is important not to miss a dose. Call your doctor or health care professional if you are unable to keep an appointment. What may interact with this medicine? -medicines to increase blood counts like filgrastim, pegfilgrastim, sargramostim -probenecid -some antibiotics like amikacin, gentamicin, neomycin, polymyxin B, streptomycin, tobramycin -zalcitabine Talk to your doctor or health care professional before taking any of these medicines: -acetaminophen -aspirin -ibuprofen -ketoprofen -naproxen This list may not describe all possible interactions. Give your health care provider a list of all the medicines, herbs, non-prescription drugs, or dietary supplements you use. Also tell them if you smoke, drink alcohol, or use illegal drugs. Some items may interact with your medicine. What should I watch for while using this medicine? Your condition will be monitored carefully while you are receiving this medicine. You will need important blood work done while you are taking this medicine. This medicine can make you more sensitive to cold. Do not drink cold drinks or use ice. Cover exposed skin before coming in contact with cold temperatures or cold objects. When out in cold weather wear warm clothing and cover your mouth and nose to warm the air that goes into your lungs. Tell your doctor if you get sensitive to   the cold. This drug may make you feel generally unwell. This is not uncommon, as chemotherapy can affect healthy cells as well as cancer cells. Report any side effects. Continue your  course of treatment even though you feel ill unless your doctor tells you to stop. In some cases, you may be given additional medicines to help with side effects. Follow all directions for their use. Call your doctor or health care professional for advice if you get a fever, chills or sore throat, or other symptoms of a cold or flu. Do not treat yourself. This drug decreases your body's ability to fight infections. Try to avoid being around people who are sick. This medicine may increase your risk to bruise or bleed. Call your doctor or health care professional if you notice any unusual bleeding. Be careful brushing and flossing your teeth or using a toothpick because you may get an infection or bleed more easily. If you have any dental work done, tell your dentist you are receiving this medicine. Avoid taking products that contain aspirin, acetaminophen, ibuprofen, naproxen, or ketoprofen unless instructed by your doctor. These medicines may hide a fever. Do not become pregnant while taking this medicine. Women should inform their doctor if they wish to become pregnant or think they might be pregnant. There is a potential for serious side effects to an unborn child. Talk to your health care professional or pharmacist for more information. Do not breast-feed an infant while taking this medicine. Call your doctor or health care professional if you get diarrhea. Do not treat yourself. What side effects may I notice from receiving this medicine? Side effects that you should report to your doctor or health care professional as soon as possible: -allergic reactions like skin rash, itching or hives, swelling of the face, lips, or tongue -low blood counts - This drug may decrease the number of white blood cells, red blood cells and platelets. You may be at increased risk for infections and bleeding. -signs of infection - fever or chills, cough, sore throat, pain or difficulty passing urine -signs of decreased  platelets or bleeding - bruising, pinpoint red spots on the skin, black, tarry stools, nosebleeds -signs of decreased red blood cells - unusually weak or tired, fainting spells, lightheadedness -breathing problems -chest pain, pressure -cough -diarrhea -jaw tightness -mouth sores -nausea and vomiting -pain, swelling, redness or irritation at the injection site -pain, tingling, numbness in the hands or feet -problems with balance, talking, walking -redness, blistering, peeling or loosening of the skin, including inside the mouth -trouble passing urine or change in the amount of urine Side effects that usually do not require medical attention (report to your doctor or health care professional if they continue or are bothersome): -changes in vision -constipation -hair loss -loss of appetite -metallic taste in the mouth or changes in taste -stomach pain This list may not describe all possible side effects. Call your doctor for medical advice about side effects. You may report side effects to FDA at 1-800-FDA-1088. Where should I keep my medicine? This drug is given in a hospital or clinic and will not be stored at home. NOTE: This sheet is a summary. It may not cover all possible information. If you have questions about this medicine, talk to your doctor, pharmacist, or health care provider.  2018 Elsevier/Gold Standard (2007-07-22 17:22:47)   Leucovorin injection What is this medicine? LEUCOVORIN (loo koe VOR in) is used to prevent or treat the harmful effects of some medicines. This medicine is   used to treat anemia caused by a low amount of folic acid in the body. It is also used with 5-fluorouracil (5-FU) to treat colon cancer. This medicine may be used for other purposes; ask your health care provider or pharmacist if you have questions. What should I tell my health care provider before I take this medicine? They need to know if you have any of these conditions: -anemia from low  levels of vitamin B-12 in the blood -an unusual or allergic reaction to leucovorin, folic acid, other medicines, foods, dyes, or preservatives -pregnant or trying to get pregnant -breast-feeding How should I use this medicine? This medicine is for injection into a muscle or into a vein. It is given by a health care professional in a hospital or clinic setting. Talk to your pediatrician regarding the use of this medicine in children. Special care may be needed. Overdosage: If you think you have taken too much of this medicine contact a poison control center or emergency room at once. NOTE: This medicine is only for you. Do not share this medicine with others. What if I miss a dose? This does not apply. What may interact with this medicine? -capecitabine -fluorouracil -phenobarbital -phenytoin -primidone -trimethoprim-sulfamethoxazole This list may not describe all possible interactions. Give your health care provider a list of all the medicines, herbs, non-prescription drugs, or dietary supplements you use. Also tell them if you smoke, drink alcohol, or use illegal drugs. Some items may interact with your medicine. What should I watch for while using this medicine? Your condition will be monitored carefully while you are receiving this medicine. This medicine may increase the side effects of 5-fluorouracil, 5-FU. Tell your doctor or health care professional if you have diarrhea or mouth sores that do not get better or that get worse. What side effects may I notice from receiving this medicine? Side effects that you should report to your doctor or health care professional as soon as possible: -allergic reactions like skin rash, itching or hives, swelling of the face, lips, or tongue -breathing problems -fever, infection -mouth sores -unusual bleeding or bruising -unusually weak or tired Side effects that usually do not require medical attention (report to your doctor or health care  professional if they continue or are bothersome): -constipation or diarrhea -loss of appetite -nausea, vomiting This list may not describe all possible side effects. Call your doctor for medical advice about side effects. You may report side effects to FDA at 1-800-FDA-1088. Where should I keep my medicine? This drug is given in a hospital or clinic and will not be stored at home. NOTE: This sheet is a summary. It may not cover all possible information. If you have questions about this medicine, talk to your doctor, pharmacist, or health care provider.  2018 Elsevier/Gold Standard (2007-07-01 16:50:29)  Fluorouracil, 5-FU injection What is this medicine? FLUOROURACIL, 5-FU (flure oh YOOR a sil) is a chemotherapy drug. It slows the growth of cancer cells. This medicine is used to treat many types of cancer like breast cancer, colon or rectal cancer, pancreatic cancer, and stomach cancer. This medicine may be used for other purposes; ask your health care provider or pharmacist if you have questions. COMMON BRAND NAME(S): Adrucil What should I tell my health care provider before I take this medicine? They need to know if you have any of these conditions: -blood disorders -dihydropyrimidine dehydrogenase (DPD) deficiency -infection (especially a virus infection such as chickenpox, cold sores, or herpes) -kidney disease -liver disease -malnourished, poor   nutrition -recent or ongoing radiation therapy -an unusual or allergic reaction to fluorouracil, other chemotherapy, other medicines, foods, dyes, or preservatives -pregnant or trying to get pregnant -breast-feeding How should I use this medicine? This drug is given as an infusion or injection into a vein. It is administered in a hospital or clinic by a specially trained health care professional. Talk to your pediatrician regarding the use of this medicine in children. Special care may be needed. Overdosage: If you think you have taken too  much of this medicine contact a poison control center or emergency room at once. NOTE: This medicine is only for you. Do not share this medicine with others. What if I miss a dose? It is important not to miss your dose. Call your doctor or health care professional if you are unable to keep an appointment. What may interact with this medicine? -allopurinol -cimetidine -dapsone -digoxin -hydroxyurea -leucovorin -levamisole -medicines for seizures like ethotoin, fosphenytoin, phenytoin -medicines to increase blood counts like filgrastim, pegfilgrastim, sargramostim -medicines that treat or prevent blood clots like warfarin, enoxaparin, and dalteparin -methotrexate -metronidazole -pyrimethamine -some other chemotherapy drugs like busulfan, cisplatin, estramustine, vinblastine -trimethoprim -trimetrexate -vaccines Talk to your doctor or health care professional before taking any of these medicines: -acetaminophen -aspirin -ibuprofen -ketoprofen -naproxen This list may not describe all possible interactions. Give your health care provider a list of all the medicines, herbs, non-prescription drugs, or dietary supplements you use. Also tell them if you smoke, drink alcohol, or use illegal drugs. Some items may interact with your medicine. What should I watch for while using this medicine? Visit your doctor for checks on your progress. This drug may make you feel generally unwell. This is not uncommon, as chemotherapy can affect healthy cells as well as cancer cells. Report any side effects. Continue your course of treatment even though you feel ill unless your doctor tells you to stop. In some cases, you may be given additional medicines to help with side effects. Follow all directions for their use. Call your doctor or health care professional for advice if you get a fever, chills or sore throat, or other symptoms of a cold or flu. Do not treat yourself. This drug decreases your body's  ability to fight infections. Try to avoid being around people who are sick. This medicine may increase your risk to bruise or bleed. Call your doctor or health care professional if you notice any unusual bleeding. Be careful brushing and flossing your teeth or using a toothpick because you may get an infection or bleed more easily. If you have any dental work done, tell your dentist you are receiving this medicine. Avoid taking products that contain aspirin, acetaminophen, ibuprofen, naproxen, or ketoprofen unless instructed by your doctor. These medicines may hide a fever. Do not become pregnant while taking this medicine. Women should inform their doctor if they wish to become pregnant or think they might be pregnant. There is a potential for serious side effects to an unborn child. Talk to your health care professional or pharmacist for more information. Do not breast-feed an infant while taking this medicine. Men should inform their doctor if they wish to father a child. This medicine may lower sperm counts. Do not treat diarrhea with over the counter products. Contact your doctor if you have diarrhea that lasts more than 2 days or if it is severe and watery. This medicine can make you more sensitive to the sun. Keep out of the sun. If you cannot avoid being   in the sun, wear protective clothing and use sunscreen. Do not use sun lamps or tanning beds/booths. What side effects may I notice from receiving this medicine? Side effects that you should report to your doctor or health care professional as soon as possible: -allergic reactions like skin rash, itching or hives, swelling of the face, lips, or tongue -low blood counts - this medicine may decrease the number of white blood cells, red blood cells and platelets. You may be at increased risk for infections and bleeding. -signs of infection - fever or chills, cough, sore throat, pain or difficulty passing urine -signs of decreased platelets or  bleeding - bruising, pinpoint red spots on the skin, black, tarry stools, blood in the urine -signs of decreased red blood cells - unusually weak or tired, fainting spells, lightheadedness -breathing problems -changes in vision -chest pain -mouth sores -nausea and vomiting -pain, swelling, redness at site where injected -pain, tingling, numbness in the hands or feet -redness, swelling, or sores on hands or feet -stomach pain -unusual bleeding Side effects that usually do not require medical attention (report to your doctor or health care professional if they continue or are bothersome): -changes in finger or toe nails -diarrhea -dry or itchy skin -hair loss -headache -loss of appetite -sensitivity of eyes to the light -stomach upset -unusually teary eyes This list may not describe all possible side effects. Call your doctor for medical advice about side effects. You may report side effects to FDA at 1-800-FDA-1088. Where should I keep my medicine? This drug is given in a hospital or clinic and will not be stored at home. NOTE: This sheet is a summary. It may not cover all possible information. If you have questions about this medicine, talk to your doctor, pharmacist, or health care provider.  2018 Elsevier/Gold Standard (2007-04-30 13:53:16)      

## 2017-06-21 ENCOUNTER — Telehealth: Payer: Self-pay | Admitting: *Deleted

## 2017-06-21 NOTE — Telephone Encounter (Signed)
Telephone call to patient to check status following first chemo. Patient reports no nausea or bowel concerns. Patient knows to call with any concerns or questions.

## 2017-06-21 NOTE — Telephone Encounter (Signed)
-----   Message from Elizabeth Shumate, RN sent at 06/20/2017  3:10 PM EDT ----- Regarding: Dr. Sherrill 1st time chemo f/u 1st time chemo f/u 

## 2017-06-22 ENCOUNTER — Inpatient Hospital Stay: Payer: 59

## 2017-06-22 VITALS — BP 130/74 | HR 93 | Temp 98.5°F | Resp 16

## 2017-06-22 DIAGNOSIS — Z5111 Encounter for antineoplastic chemotherapy: Secondary | ICD-10-CM | POA: Diagnosis not present

## 2017-06-22 DIAGNOSIS — C20 Malignant neoplasm of rectum: Secondary | ICD-10-CM

## 2017-06-22 MED ORDER — HEPARIN SOD (PORK) LOCK FLUSH 100 UNIT/ML IV SOLN
500.0000 [IU] | Freq: Once | INTRAVENOUS | Status: AC | PRN
  Administered 2017-06-22: 500 [IU]
  Filled 2017-06-22: qty 5

## 2017-06-22 MED ORDER — SODIUM CHLORIDE 0.9% FLUSH
10.0000 mL | INTRAVENOUS | Status: DC | PRN
  Administered 2017-06-22: 10 mL
  Filled 2017-06-22: qty 10

## 2017-06-22 NOTE — Patient Instructions (Signed)
Implanted [facility] Guide An implanted port is a type of central line that is placed under the skin. Central lines are used to provide IV access when treatment or nutrition needs to be given through a person's veins. Implanted ports are used for long-term IV access. An implanted port may be placed because:  You need IV medicine that would be irritating to the small veins in your hands or arms.  You need long-term IV medicines, such as antibiotics.  You need IV nutrition for a long period.  You need frequent blood draws for lab tests.  You need dialysis.  Implanted ports are usually placed in the chest area, but they can also be placed in the upper arm, the abdomen, or the leg. An implanted port has two main parts:  Reservoir. The reservoir is round and will appear as a small, raised area under your skin. The reservoir is the part where a needle is inserted to give medicines or draw blood.  Catheter. The catheter is a thin, flexible tube that extends from the reservoir. The catheter is placed into a large vein. Medicine that is inserted into the reservoir goes into the catheter and then into the vein.  How will I care for my incision site? It is okay get the incision site wet. Bathe or shower as directed by your health care provider. Do not peel the skin glue off. How is my port accessed? Special steps must be taken to access the port:  Before the port is accessed, a numbing cream can be placed on the skin. This helps numb the skin over the port site.  Your health care provider uses a sterile technique to access the port. ? Your health care provider must put on a mask and sterile gloves. ? The skin over your port is cleaned carefully with an antiseptic and allowed to dry. ? The port is gently pinched between sterile gloves, and a needle is inserted into the port.  Only "non-coring" port needles should be used to access the port. Once the port is accessed, a blood return should be  checked. This helps ensure that the port is in the vein and is not clogged.  If your port needs to remain accessed for a constant infusion, a clear (transparent) bandage will be placed over the needle site. The bandage and needle will need to be changed every week, or as directed by your health care provider.  Keep the bandage covering the needle clean and dry. Do not get it wet. Follow your health care provider's instructions on how to take a shower or bath while the port is accessed.  If your port does not need to stay accessed, no bandage is needed over the port.  What is flushing? Flushing helps keep the port from getting clogged. Follow your health care provider's instructions on how and when to flush the port. Ports are usually flushed with saline solution or a medicine called heparin. The need for flushing will depend on how the port is used.  If the port is used for intermittent medicines or blood draws, the port will need to be flushed: ? After medicines have been given. ? After blood has been drawn. ? As part of routine maintenance.  If a constant infusion is running, the port may not need to be flushed.  How long will my port stay implanted? The port can stay in for as long as your health care provider thinks it is needed. When it is time for  the port to come out, surgery will be done to remove it. The procedure is similar to the one performed when the port was put in. When should I seek immediate medical care? When you have an implanted port, you should seek immediate medical care if:  You notice a bad smell coming from the incision site.  You have swelling, redness, or drainage at the incision site.  You have more swelling or pain at the port site or the surrounding area.  You have a fever that is not controlled with medicine.  This information is not intended to replace advice given to you by your health care provider. Make sure you discuss any questions you have with  your health care provider. Document Released: 12/25/2004 Document Revised: 06/02/2015 Document Reviewed: 09/01/2012 Elsevier Interactive Patient Education  2017 [employer].

## 2017-06-22 NOTE — Progress Notes (Signed)
DC with pump at 10.24mL remaining. DC per Dr. Benay Spice preferred protocol.

## 2017-06-26 ENCOUNTER — Ambulatory Visit: Payer: 59 | Attending: Oncology | Admitting: Physical Therapy

## 2017-06-26 ENCOUNTER — Other Ambulatory Visit: Payer: Self-pay

## 2017-06-26 ENCOUNTER — Encounter: Payer: Self-pay | Admitting: Physical Therapy

## 2017-06-26 ENCOUNTER — Telehealth: Payer: Self-pay

## 2017-06-26 DIAGNOSIS — R279 Unspecified lack of coordination: Secondary | ICD-10-CM | POA: Insufficient documentation

## 2017-06-26 DIAGNOSIS — E1165 Type 2 diabetes mellitus with hyperglycemia: Secondary | ICD-10-CM | POA: Diagnosis not present

## 2017-06-26 DIAGNOSIS — M6281 Muscle weakness (generalized): Secondary | ICD-10-CM | POA: Diagnosis not present

## 2017-06-26 DIAGNOSIS — E1142 Type 2 diabetes mellitus with diabetic polyneuropathy: Secondary | ICD-10-CM | POA: Diagnosis not present

## 2017-06-26 DIAGNOSIS — Z794 Long term (current) use of insulin: Secondary | ICD-10-CM | POA: Diagnosis not present

## 2017-06-26 NOTE — Progress Notes (Signed)
  Oncology Nurse Navigator Documentation  Navigator Location: [facility] (06/26/17 1534)   )Navigator Encounter Type: Telephone (06/26/17 1534) Telephone: Outgoing Call(Left VM) (06/26/17 1534)  Left VM letting him know that he can call with questions or concerns.               Treatment Initiated Date: 06/20/17 (06/26/17 1534)         Interventions: Other(Offer support and encouargement) (06/26/17 1534)            Acuity: Level 2 (06/26/17 1534)     Acuity Level 3: Ongoing guidance and education provided throughout treatment (06/26/17 1534)   Time Spent with Patient: 15 (06/26/17 1534)

## 2017-06-26 NOTE — Therapy (Signed)
[facility] [address], [place], [place], [address] Phone: [phone]   Fax:  [phone]  Physical Therapy Evaluation  Patient Details  Name: Charles [name] MRN: [id number] Date of Birth: Aug 07, 1965 Referring Provider: Ladell Pier, MD   Encounter Date: 06/26/2017  PT End of Session - 06/26/17 1203    Visit Number  1    Date for PT Re-Evaluation  09/18/17    PT Start Time  1102    PT Stop Time  1143    PT Time Calculation (min)  41 min    Activity Tolerance  Patient tolerated treatment well    Behavior During Therapy  [facility] for tasks assessed/performed       Past Medical History:  Diagnosis Date  . Diabetes mellitus without complication ([place])    Type II  . Hyperlipidemia     Past Surgical History:  Procedure Laterality Date  . ACHILLES TENDON REPAIR Right 2005  . APPENDECTOMY    . HERNIA REPAIR     as a baby  . PORTACATH PLACEMENT N/A 06/17/2017   Procedure: RIGHT VS LEFT INTERNAL JUGULAR PORT-A-CATH PLACEMENT WITH ULTRASOUND;  Surgeon: Ileana Roup, MD;  Location: [facility];  Service: General;  Laterality: N/A;  FLURO    There were no vitals filed for this visit.   Subjective Assessment - 06/26/17 1105    Subjective  Pt started chemo last week.  He had port put in 06/17/17.  Pt will be starting radiation tentatively in 4 months.      Patient Stated Goals  know what to do for strengthening    Currently in Pain?  No/denies         [facility] PT Assessment - 06/26/17 0001      Assessment   Medical Diagnosis  C20 (ICD-10-CM) - Rectal cancer [facility])    Referring Provider  Ladell Pier, MD    Onset Date/Surgical Date  -- started treatment 06/20/17 (chemo)    Prior Therapy  No      Precautions   Precautions  None      Restrictions   Weight Bearing Restrictions  No      Balance Screen   Has the patient fallen in the past 6 months  No      [place]  residence    Living Arrangements  Spouse/significant other      Prior Function   Level of Independence  Independent    Vocation  Full time employment    Vocation Requirements  traveling; 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)      Cognition   Overall Cognitive Status  Within Functional Limits for tasks assessed      Sensation   Light Touch  Appears Intact      Posture/Postural Control   Posture/Postural Control  Postural limitations    Postural Limitations  Rounded Shoulders;Increased thoracic kyphosis      ROM / Strength   AROM / PROM / Strength  Strength      Strength   Strength Assessment Site  Hip    Right/Left Hip  -- Bilat adduction 4-/5; bilat abduction 4+/5      Flexibility   Soft Tissue Assessment /Muscle Length  yes    Hamstrings  bilateral 80%      Palpation   Palpation comment  tension in glutes      6 Minute walk- Post Test   6 Minute Walk  Post Test  --      6 minute walk test results    Aerobic Endurance Distance Walked  1160      Standardized Balance Assessment   Standardized Balance Assessment  Five Times Sit to Stand    Five times sit to stand comments   11 sec                Objective measurements completed on examination: See above findings.    Pelvic Floor Special Questions - 06/26/17 0001    Prior Pelvic/Prostate Exam  Yes    Currently Sexually Active  Yes    Urinary urgency  No    Urinary frequency  reports normal    Fecal incontinence  -- constipation    Fluid intake  60-64 water; cut down on sodas    Skin Integrity  Intact    Prolapse  None    Pelvic Floor Internal Exam  pt informed and consent given to perform internal soft tissue assessment    Exam Type  Rectal    Palpation  puborectalis tight, some contracting of anal sphincters when attempting to evacuate, difficutly relaxing after contracting    Strength  weak squeeze, no lift    Strength # of reps  3    Tone  high               PT  Education - 06/26/17 1202    Education Details  toileting, exercises plan, energy conservation    Person(s) Educated  Patient    Methods  Explanation;Verbal cues;Handout    Comprehension  Verbalized understanding       PT Short Term Goals - 06/26/17 1222      PT SHORT TERM GOAL #1   Title  ind with toileting techniques for healthy BM    Time  3    Period  Weeks    Status  New    Target Date  07/17/17      PT SHORT TERM GOAL #2   Title  ind with HEP    Time  3    Period  Weeks    Status  New    Target Date  07/17/17        PT Long Term Goals - 06/26/17 1223      PT LONG TERM GOAL #1   Title  Able to coordinate pelvic floor for contract, relax, and bulge correctly and 3/5 MMT for pelvic floor contraction    Time  12    Period  Weeks    Status  New    Target Date  09/18/17      PT LONG TERM GOAL #2   Title  Pt will demonstrate 6MWT of 1300 feet or more for improved endurance with walking routine    Time  12    Period  Weeks    Status  New    Target Date  09/18/17      PT LONG TERM GOAL #3   Title  pt will demonstrate at least 20 mV contraction of pelvic floor and able to hold 10 sec for 5 reps    Time  12    Period  Weeks    Status  New    Target Date  09/18/17      PT LONG TERM GOAL #4   Title  pt will demonstrate 5 times sit to stand 10 seconds or less due to improved and maintained strength with exercise program    Time  12  Period  Weeks    Status  New    Target Date  09/18/17      PT LONG TERM GOAL #5   Title  Pt will report ability to have complete bowel movement without excessive bearing down throughout cancer treatments due to good muscle strength and coordination    Time  12    Period  Weeks    Status  New    Target Date  09/18/17             Plan - 06/26/17 1204    Clinical Impression Statement  Pt presents to clinic today due to decreased endurance and strengthening needed for recently started cancer treatment.  Pt has postural  deficits as stated above.  He performed 6MWT completed 1160 feet and age related norm for 52 y/o is 23 ft.  Pt demonstrates some LE weakness as mentioned above.  Pt has decreased coordination of pelvic floor muscles and tightens anal sphincters with attempts to evacuate.  Pt has weakness of pelvic floor 2/5 and difficulty relaxing needing 8 seconds to reduce tone of pelvic floor.  Pt performed five times sit to stand in 11 sec which is right at the normal limit for 52 y/o age range.  Pt will benefit from skilled PT to address impairments for maximum functional outcomes throughout cancer treatments.    History and Personal Factors relevant to plan of care:  chemo started 6/13; radiation to begin in approximately 4 months    Clinical Presentation  Evolving    Clinical Presentation due to:  pt has condition which will be worsening throughout cancer treatment    Rehab Potential  Excellent    PT Frequency  2x / week reduce to 1x/month after 3 weeks    PT Duration  12 weeks    PT Treatment/Interventions  ADLs/Self Care Home Management;Biofeedback;Cryotherapy;Electrical Stimulation;Moist Heat;Gait training;Stair training;Functional mobility training;Therapeutic activities;Therapeutic exercise;Balance training;Neuromuscular re-education;Patient/family education;Manual techniques;Passive range of motion;Dry needling;Taping    PT Next Visit Plan  biofeedback; balance, trunk, LE ROM and stretches    PT Home Exercise Plan  initiate HEP with bands and PF strength    Recommended Other Services  eval 6/19    Consulted and Agree with Plan of Care  Patient       Patient will benefit from skilled therapeutic intervention in order to improve the following deficits and impairments:  Decreased strength, Decreased coordination, Impaired tone, Decreased endurance, Postural dysfunction  Visit Diagnosis: Muscle weakness (generalized) - Plan: PT plan of care cert/re-cert  Unspecified lack of coordination - Plan: PT plan  of care cert/re-cert     Problem List Patient Active Problem List   Diagnosis Date Noted  . Port-A-Cath in place 06/20/2017  . Rectal cancer ([place]) 06/12/2017  . Other and unspecified hyperlipidemia 06/19/2013  . Type II or unspecified type diabetes mellitus with neurological manifestations, not stated as uncontrolled(250.60) 06/19/2013    Zannie Cove, PT 06/26/2017, 3:08 PM   [facility] [address], [place], [place], [address] Phone: [phone]   Fax:  [phone]  Name: Charles [name] MRN: [id number] Date of Birth: 18-Jun-1965

## 2017-06-26 NOTE — Telephone Encounter (Signed)
-----   Message from Rennis Harding, RN sent at 06/20/2017  3:10 PM EDT ----- Regarding: Dr. Benay Spice 1st time chemo f/u 1st time chemo f/u

## 2017-06-26 NOTE — Patient Instructions (Signed)
Toileting Techniques for Bowel Movements (Defecation) Using your belly (abdomen) and pelvic floor muscles to have a bowel movement is usually instinctive.  Sometimes people can have problems with these muscles and have to relearn proper defecation (emptying) techniques.  If you have weakness in your muscles, organs that are falling out, decreased sensation in your pelvis, or ignore your urge to go, you may find yourself straining to have a bowel movement.  You are straining if you are: . holding your breath or taking in a huge gulp of air and holding it  . keeping your lips and jaw tensed and closed tightly . turning red in the face because of excessive pushing or forcing . developing or worsening your  hemorrhoids . getting faint while pushing . not emptying completely and have to defecate many times a day  If you are straining, you are actually making it harder for yourself to have a bowel movement.  Many people find they are pulling up with the pelvic floor muscles and closing off instead of opening the anus. Due to lack pelvic floor relaxation and coordination the abdominal muscles, one has to work harder to push the feces out.  Many people have never been taught how to defecate efficiently and effectively.  Notice what happens to your body when you are having a bowel movement.  While you are sitting on the toilet pay attention to the following areas: . Jaw and mouth position . Angle of your hips   . Whether your feet touch the ground or not . Arm placement  . Spine position . Waist . Belly tension . Anus (opening of the anal canal)  An Evacuation/Defecation Plan   Here are the 4 basic points:  1. Lean forward enough for your elbows to rest on your knees 2. Support your feet on the floor or use a low stool if your feet don't touch the floor  3. Push out your belly as if you have swallowed a beach ball-you should feel a widening of your waist 4. Open and relax your pelvic floor muscles,  rather than tightening around the anus      The following conditions my require modifications to your toileting posture:  . If you have had surgery in the past that limits your back, hip, pelvic, knee or ankle flexibility . Constipation   Your healthcare practitioner may make the following additional suggestions and adjustments:  1) Sit on the toilet  a) Make sure your feet are supported. b) Notice your hip angle and spine position-most people find it effective to lean forward or raise their knees, which can help the muscles around the anus to relax  c) When you lean forward, place your forearms on your thighs for support  2) Relax suggestions a) Breath deeply in through your nose and out slowly through your mouth as if you are smelling the flowers and blowing out the candles. b) To become aware of how to relax your muscles, contracting and releasing muscles can be helpful.  Pull your pelvic floor muscles in tightly by using the image of holding back gas, or closing around the anus (visualize making a circle smaller) and lifting the anus up and in.  Then release the muscles and your anus should drop down and feel open. Repeat 5 times ending with the feeling of relaxation. c) Keep your pelvic floor muscles relaxed; let your belly bulge out. d) The digestive tract starts at the mouth and ends at the anal opening, so be   sure to relax both ends of the tube.  Place your tongue on the roof of your mouth with your teeth separated.  This helps relax your mouth and will help to relax the anus at the same time.  3) Empty (defecation) a) Keep your pelvic floor and sphincter relaxed, then bulge your anal muscles.  Make the anal opening wide.  b) Stick your belly out as if you have swallowed a beach ball. c) Make your belly wall hard using your belly muscles while continuing to breathe. Doing this makes it easier to open your anus. d) Breath out and give a grunt (or try using other sounds such as  ahhhh, shhhhh, ohhhh or grrrrrrr).  4) Finish a) As you finish your bowel movement, pull the pelvic floor muscles up and in.  This will leave your anus in the proper place rather than remaining pushed out and down. If you leave your anus pushed out and down, it will start to feel as though that is normal and give you incorrect signals about needing to have a bowel movement.    [facility] [place], [place] [address]   Ways to get started on an exercise program 1.  Start for 10 minutes per day with a walking program. 2. Work towards 30 minutes of exercise per day 3. When you do an aerobic exercise program start on a low level 4. Water aerobics is a good place due to decreased strain on your joints 5. Begin your exercise program gradually and progress slowly over time 6. When exercising use correct form. a. Keep neutral spine b. Engage abdominals c. Keep chest up  d. Chin down e. Do not lock your knees  [facility], [place], [place] [address] [phone]   The Importance of Exercise as it Relates to [place] suggests that people who have been treated for colon cancer can reduce their risk of the cancer coming back and improve their odds of survival by as much as 50% if they engage in regular exercise. This is because of the effect exercise has on chemicals in the body that are related to cancer development and progression. Studies have also shown a 24% lower risk of colon cancer in people who are physically active compared to those who are not.  Make it your goal to achieve the American Cancer Society's recommendation to exercise at a moderate to vigorous pace for 30 to 60 minutes at least 5 days a week. You can do this by walking, biking, taking a Zumba class, swimming, or any combination of activities that keeps you moving for that length of time. Not sure how to start an  exercise program? . Start small and set reasonable interim goals.  Don't expect to spend hours in the gym. . If you want to change how your body looks, take a "before" picture and look at it from time to time to see if you notice changes in your weight or muscle tone as a result of your workouts. . Find a friend to exercise with.  You can each help the other stay on track and have fun too. . Schedule time to exercise.  It's been found that more people stick with an exercise program if they do it before their workday begins. . Making only one change at a time with your exercise program can reduce the chance of setting yourself up for failure. How to stay  motivated . Choose a type of exercise that you enjoy-you'll be more likely to stick with it. Marland Kitchen Keep in mind how good it feels after you have exercised!  . If you are feeling achy, sore, or fatigued, it's okay to limit your exercise that day. Then don't beat yourself up about not doing enough. Instead, feel good about what you were able to do.  . Make a reward system for yourself. . Have a buddy to exercise with; encourage each other. . Enroll in a class so others expect you to be there. While the following suggestions increase overall activity and general health, remember moderate to vigorous exercise is still important. Here are some ideas for increasing activity: . Go for a walk to help general endurance. . Take the stairs instead of the elevator for endurance, strength, and improving function. . Try a yoga class to work on your flexibility. . Do resistance training with light weights or resistive bands to build strength. . Take frequent breaks throughout the day to stand, stretch, and take short walks.  This can help you feel better both physically and mentally.   [place], [place] [address]   Tips for Energy Conservation for Activities of Daily Living . Plan ahead to avoid rushing. . Sit down to bathe and dry off. Wear  a terry robe instead of drying off. . Use a shower/bath organizer to decrease leaning and reaching. . Use extension handles on sponges and brushes. Susa Simmonds grab rails in the bathroom or use an elevated toilet seat. Hoyle Barr out clothes and toiletries before dressing. . Minimize leaning over to put on clothes and shoes. Bring your foot to your knee to apply socks and shoes. . Wear comfortable shoes and low-heeled, slip on shoes. Wear button front shirts rather than pullovers. Housekeeping . Schedule household tasks throughout the week. . Do housework sitting down when possible. . Delegate heavy housework, shopping, laundry and child care when possible. . Drag or slide objects rather than lifting. . Sit when ironing and take rest periods. . Stop working before becoming overly tired. Shopping . Organize list by aisle. . Use a grocery cart for support. Marland Kitchen Shop at less busy times. . Ask for help with getting to the car. Meal Preparation . Use convenience and easy-to-prepare foods. . Use small appliances that take less effort to use. Marland Kitchen Prepare meals sitting down. . Soak dishes instead of scrubbing and let dishes air dry. . Prepare double portions and freeze half. Child Care . Plan activities that can be done sitting down, such as drawing pictures, playing games, reading, and computer games. . Encourage children to climb up onto your lap or into the highchair instead of being lifted. . Make a game of the household chores so that children will want to help. . Delegate child care when possible.  Zannie Cove, PT, [facility] [place]; [address], [place],  [address]

## 2017-06-26 NOTE — Telephone Encounter (Signed)
Err

## 2017-06-28 ENCOUNTER — Ambulatory Visit: Payer: 59 | Admitting: Physical Therapy

## 2017-06-28 ENCOUNTER — Encounter: Payer: Self-pay | Admitting: Oncology

## 2017-06-28 DIAGNOSIS — M6281 Muscle weakness (generalized): Secondary | ICD-10-CM | POA: Diagnosis not present

## 2017-06-28 DIAGNOSIS — R279 Unspecified lack of coordination: Secondary | ICD-10-CM

## 2017-06-28 NOTE — Therapy (Signed)
[facility] [address] [place], [place], [address] Phone: [phone]   Fax:  [phone]  Physical Therapy Treatment  Patient Details  Name: Charles [name] MRN: [id number] Date of Birth: 02/07/1965 Referring Provider: Ladell Pier, MD   Encounter Date: 06/28/2017  PT End of Session - 06/28/17 0902    Visit Number  2    Date for PT Re-Evaluation  09/18/17    PT Start Time  3762    PT Stop Time  0930    PT Time Calculation (min)  43 min    Activity Tolerance  Patient tolerated treatment well    Behavior During Therapy  [facility] for tasks assessed/performed       Past Medical History:  Diagnosis Date  . Diabetes mellitus without complication ([place])    Type II  . Hyperlipidemia     Past Surgical History:  Procedure Laterality Date  . ACHILLES TENDON REPAIR Right 2005  . APPENDECTOMY    . HERNIA REPAIR     as a baby  . PORTACATH PLACEMENT N/A 06/17/2017   Procedure: RIGHT VS LEFT INTERNAL JUGULAR PORT-A-CATH PLACEMENT WITH ULTRASOUND;  Surgeon: Ileana Roup, MD;  Location: [facility];  Service: General;  Laterality: N/A;  FLURO    There were no vitals filed for this visit.  Subjective Assessment - 06/28/17 1222    Subjective  Pt states he went for a walk yesterday and reports feeling good.    Currently in Pain?  No/denies                       [facility] Adult PT Treatment/Exercise - 06/28/17 0001      Neuro Re-ed    Neuro Re-ed Details   resting 6.59mV; quick had difficulty relaxing max contract 45mV; 10 sec hold biofeedback during exercises for monitoring pelvic floor       Lumbar Exercises: Standing   Row  Strengthening;Both;20 reps;Theraband    Theraband Level (Row)  Level 1 (Yellow)    Shoulder Extension  Strengthening;Both;20 reps;Theraband    Theraband Level (Shoulder Extension)  Level 1 (Yellow)      Lumbar Exercises: Supine   Glut Set  10 reps;3 seconds pelvic floor  contraction    Bent Knee Raise  20 reps    Bridge  10 reps;3 seconds             PT Education - 06/28/17 1216    Education Details   Access Code: [email or web address]     Person(s) Educated  Patient    Methods  Explanation;Demonstration;Handout;Verbal cues    Comprehension  Verbalized understanding;Returned demonstration       PT Short Term Goals - 06/28/17 1220      PT SHORT TERM GOAL #1   Title  ind with toileting techniques for healthy BM    Time  3    Period  Weeks    Status  On-going      PT SHORT TERM GOAL #2   Title  ind with HEP    Time  3    Period  Weeks    Status  On-going        PT Long Term Goals - 06/26/17 1223      PT LONG TERM GOAL #1   Title  Able to coordinate pelvic floor for contract, relax, and bulge correctly and 3/5 MMT for pelvic floor contraction    Time  12    Period  Weeks  Status  New    Target Date  09/18/17      PT LONG TERM GOAL #2   Title  Pt will demonstrate 6MWT of 1300 feet or more for improved endurance with walking routine    Time  12    Period  Weeks    Status  New    Target Date  09/18/17      PT LONG TERM GOAL #3   Title  pt will demonstrate at least 20 mV contraction of pelvic floor and able to hold 10 sec for 5 reps    Time  12    Period  Weeks    Status  New    Target Date  09/18/17      PT LONG TERM GOAL #4   Title  pt will demonstrate 5 times sit to stand 10 seconds or less due to improved and maintained strength with exercise program    Time  12    Period  Weeks    Status  New    Target Date  09/18/17      PT LONG TERM GOAL #5   Title  Pt will report ability to have complete bowel movement without excessive bearing down throughout cancer treatments due to good muscle strength and coordination    Time  12    Period  Weeks    Status  New    Target Date  09/18/17            Plan - 06/28/17 0847    Clinical Impression Statement  Pt did well with biofeedback.  He demonstrates ability to engage muscles  when cues.  He needs cues and stretches to relax muscle back down to baseline.  Pt needs cues for posture and TrA activation during standing and supine.  Pt will benefit from skilled PT to progress strengthening and improved pelvic floor control for maximum function.    PT Treatment/Interventions  ADLs/Self Care Home Management;Biofeedback;Cryotherapy;Electrical Stimulation;Moist Heat;Gait training;Stair training;Functional mobility training;Therapeutic activities;Therapeutic exercise;Balance training;Neuromuscular re-education;Patient/family education;Manual techniques;Passive range of motion;Dry needling;Taping    PT Next Visit Plan  balance, trunk, LE ROM and stretches, review HEP and toileting techniques    PT Home Exercise Plan   Access Code: [email or web address]     Consulted and Agree with Plan of Care  Patient       Patient will benefit from skilled therapeutic intervention in order to improve the following deficits and impairments:  Decreased strength, Decreased coordination, Impaired tone, Decreased endurance, Postural dysfunction  Visit Diagnosis: Muscle weakness (generalized)  Unspecified lack of coordination     Problem List Patient Active Problem List   Diagnosis Date Noted  . Port-A-Cath in place 06/20/2017  . Rectal cancer ([place]) 06/12/2017  . Other and unspecified hyperlipidemia 06/19/2013  . Type II or unspecified type diabetes mellitus with neurological manifestations, not stated as uncontrolled(250.60) 06/19/2013    Zannie Cove, PT 06/28/2017, 12:22 PM  [facility] [address] [place], [place], [address] Phone: [phone]   Fax:  [phone]  Name: Charles [name] MRN: [id number] Date of Birth: 1965/10/15

## 2017-06-28 NOTE — Patient Instructions (Signed)
Access Code: [email or web address]  URL: [email or web address]  Date: 06/28/2017  Prepared by: Lovett Calender   Exercises  Supine Single Knee to Chest Stretch - 5 reps - 1 sets - 10 sec hold - 2x daily - 7x weekly  Supine Piriformis Stretch - 3 reps - 1 sets - 30 sec hold - 1x daily - 7x weekly  Bent Knee Fallouts - 10 reps - 1 sets - 2x daily - 7x weekly  Hooklying Clamshell with Resistance - 10 reps - 1 sets - 3 sec hold - 2x daily - 7x weekly  Bridge - 10 reps - 1 sets - 3 sec hold - 2x daily - 7x weekly  Seated Piriformis Stretch with Trunk Bend - 3 reps - 1 sets - 30 sec hold - 2x daily - 7x weekly  Standing Shoulder Row with Anchored Resistance - 10 reps - 2 sets - 2x daily - 7x weekly  Shoulder Extension with Resistance - Neutral - 10 reps - 2 sets - 2x daily - 7x weekly   [facility] [address] [place], [place] [address] Phone # [phone] Fax [phone]

## 2017-06-30 ENCOUNTER — Other Ambulatory Visit: Payer: Self-pay | Admitting: Oncology

## 2017-07-02 ENCOUNTER — Encounter: Payer: Self-pay | Admitting: Physical Therapy

## 2017-07-02 ENCOUNTER — Ambulatory Visit: Payer: 59 | Admitting: Physical Therapy

## 2017-07-02 DIAGNOSIS — M6281 Muscle weakness (generalized): Secondary | ICD-10-CM

## 2017-07-02 DIAGNOSIS — R279 Unspecified lack of coordination: Secondary | ICD-10-CM

## 2017-07-02 NOTE — Therapy (Signed)
[facility] [address] [place], [place], [address] Phone: [phone]   Fax:  [phone]  Physical Therapy Treatment  Patient Details  Name: Charles [name] MRN: [id number] Date of Birth: 08/04/1965 Referring Provider: Ladell Pier, MD   Encounter Date: 07/02/2017  PT End of Session - 07/02/17 0920    Visit Number  3    Date for PT Re-Evaluation  09/18/17    PT Start Time  7253    PT Stop Time  0928    PT Time Calculation (min)  41 min    Activity Tolerance  Patient tolerated treatment well    Behavior During Therapy  [facility] for tasks assessed/performed       Past Medical History:  Diagnosis Date  . Diabetes mellitus without complication ([place])    Type II  . Hyperlipidemia     Past Surgical History:  Procedure Laterality Date  . ACHILLES TENDON REPAIR Right 2005  . APPENDECTOMY    . HERNIA REPAIR     as a baby  . PORTACATH PLACEMENT N/A 06/17/2017   Procedure: RIGHT VS LEFT INTERNAL JUGULAR PORT-A-CATH PLACEMENT WITH ULTRASOUND;  Surgeon: Ileana Roup, MD;  Location: [facility];  Service: General;  Laterality: N/A;  FLURO    There were no vitals filed for this visit.  Subjective Assessment - 07/02/17 0853    Subjective  Pt reports he did the exercises 1x since last visit and they went.      Patient Stated Goals  know what to do for strengthening    Currently in Pain?  No/denies                       [facility] Adult PT Treatment/Exercise - 07/02/17 0001      Lumbar Exercises: Stretches   Active Hamstring Stretch  Right;Left;2 reps;20 seconds    Single Knee to Chest Stretch  Right;Left;2 reps;20 seconds    Hip Flexor Stretch  Right;Left;2 reps;20 seconds    Other Lumbar Stretch Exercise  thoracic rotation 0- 10x 5 sec - sid      Lumbar Exercises: Aerobic   Nustep  L1 x 8 min      Lumbar Exercises: Standing   Other Standing Lumbar Exercises  single leg on foam; on floor with star  reatches [address]; tandem standing with UE flex; tandem walking             PT Education - 07/02/17 0945    Person(s) Educated  Patient    Methods  Explanation;Demonstration;Handout;Verbal cues    Comprehension  Verbalized understanding;Returned demonstration       PT Short Term Goals - 06/28/17 1220      PT SHORT TERM GOAL #1   Title  ind with toileting techniques for healthy BM    Time  3    Period  Weeks    Status  On-going      PT SHORT TERM GOAL #2   Title  ind with HEP    Time  3    Period  Weeks    Status  On-going        PT Long Term Goals - 06/26/17 1223      PT LONG TERM GOAL #1   Title  Charles to coordinate pelvic floor for contract, relax, and bulge correctly and 3/5 MMT for pelvic floor contraction    Time  12    Period  Weeks    Status  New  Target Date  09/18/17      PT LONG TERM GOAL #2   Title  Pt will demonstrate 6MWT of 1300 feet or more for improved endurance with walking routine    Time  12    Period  Weeks    Status  New    Target Date  09/18/17      PT LONG TERM GOAL #3   Title  pt will demonstrate at least 20 mV contraction of pelvic floor and Charles to hold 10 sec for 5 reps    Time  12    Period  Weeks    Status  New    Target Date  09/18/17      PT LONG TERM GOAL #4   Title  pt will demonstrate 5 times sit to stand 10 seconds or less due to improved and maintained strength with exercise program    Time  12    Period  Weeks    Status  New    Target Date  09/18/17      PT LONG TERM GOAL #5   Title  Pt will report ability to have complete bowel movement without excessive bearing down throughout cancer treatments due to good muscle strength and coordination    Time  12    Period  Weeks    Status  New    Target Date  09/18/17            Plan - 07/02/17 0920    Clinical Impression Statement  Pt did well with exercises today.  He was Charles to demonstrate understanding of balance exercises and added to HEP.  Pt continues  to benefit from skilled PT to improve core strength and ROM.    PT Treatment/Interventions  ADLs/Self Care Home Management;Biofeedback;Cryotherapy;Electrical Stimulation;Moist Heat;Gait training;Stair training;Functional mobility training;Therapeutic activities;Therapeutic exercise;Balance training;Neuromuscular re-education;Patient/family education;Manual techniques;Passive range of motion;Dry needling;Taping    PT Next Visit Plan  balance, trunk, LE ROM and stretches    PT Home Exercise Plan   Access Code: [email or web address]     Consulted and Agree with Plan of Care  Patient       Patient will benefit from skilled therapeutic intervention in order to improve the following deficits and impairments:  Decreased strength, Decreased coordination, Impaired tone, Decreased endurance, Postural dysfunction  Visit Diagnosis: Muscle weakness (generalized)  Unspecified lack of coordination     Problem List Patient Active Problem List   Diagnosis Date Noted  . Port-A-Cath in place 06/20/2017  . Rectal cancer ([place]) 06/12/2017  . Other and unspecified hyperlipidemia 06/19/2013  . Type II or unspecified type diabetes mellitus with neurological manifestations, not stated as uncontrolled(250.60) 06/19/2013    Zannie Cove, PT 07/02/2017, 9:45 AM  [facility] [address], [place], [place], [address] Phone: [phone]   Fax:  [phone]  Name: Charles [name] MRN: [id number] Date of Birth: September 26, 1965

## 2017-07-02 NOTE — Patient Instructions (Signed)
Access Code: [email or web address]  URL: [email or web address]  Date: 07/02/2017  Prepared by: Lovett Calender   Exercises  Supine Single Knee to Chest Stretch - 5 reps - 1 sets - 10 sec hold - 2x daily - 7x weekly  Supine Piriformis Stretch - 3 reps - 1 sets - 30 sec hold - 1x daily - 7x weekly  Bent Knee Fallouts - 10 reps - 1 sets - 2x daily - 7x weekly  Hooklying Clamshell with Resistance - 10 reps - 1 sets - 3 sec hold - 2x daily - 7x weekly  Bridge - 10 reps - 1 sets - 3 sec hold - 2x daily - 7x weekly  Seated Piriformis Stretch with Trunk Bend - 3 reps - 1 sets - 30 sec hold - 2x daily - 7x weekly  Standing Shoulder Row with Anchored Resistance - 10 reps - 2 sets - 2x daily - 7x weekly  Shoulder Extension with Resistance - Neutral - 10 reps - 2 sets - 2x daily - 7x weekly  Walking Tandem Stance - 10 reps - 3 sets - 1x daily - 7x weekly  Alternating Single Leg Balance - Foot Behind - 10 reps - 3 sets - 1x daily - 7x weekly  Single Leg Balance with Opposite Leg Star Reach - 10 reps - 3 sets - 1x daily - 7x weekly

## 2017-07-03 ENCOUNTER — Other Ambulatory Visit: Payer: Self-pay | Admitting: *Deleted

## 2017-07-03 ENCOUNTER — Encounter: Payer: Self-pay | Admitting: Oncology

## 2017-07-03 DIAGNOSIS — C20 Malignant neoplasm of rectum: Secondary | ICD-10-CM

## 2017-07-03 NOTE — Progress Notes (Signed)
Called pt to introduce myself as his Arboriculturist.  Unfortunately there aren't any foundations offering copay assistance for his Dx and the type of ins he has.  I offered the [place], went over what it covers and gave him the income requirement.  Pt stated he exceeds the requirement so he doesn't qualify for the grant.  I will give him my card on 07/04/17 for any questions or concerns he may have in the future.

## 2017-07-04 ENCOUNTER — Inpatient Hospital Stay: Payer: 59 | Admitting: Nurse Practitioner

## 2017-07-04 ENCOUNTER — Encounter: Payer: Self-pay | Admitting: Nurse Practitioner

## 2017-07-04 ENCOUNTER — Inpatient Hospital Stay: Payer: 59

## 2017-07-04 ENCOUNTER — Telehealth: Payer: Self-pay

## 2017-07-04 VITALS — BP 120/75 | HR 108 | Temp 98.0°F | Resp 18 | Ht 68.0 in | Wt 223.5 lb

## 2017-07-04 DIAGNOSIS — C20 Malignant neoplasm of rectum: Secondary | ICD-10-CM | POA: Diagnosis not present

## 2017-07-04 DIAGNOSIS — Z95828 Presence of other vascular implants and grafts: Secondary | ICD-10-CM

## 2017-07-04 DIAGNOSIS — E119 Type 2 diabetes mellitus without complications: Secondary | ICD-10-CM

## 2017-07-04 DIAGNOSIS — Z5111 Encounter for antineoplastic chemotherapy: Secondary | ICD-10-CM | POA: Diagnosis not present

## 2017-07-04 LAB — CBC WITH DIFFERENTIAL (CANCER CENTER ONLY)
BASOS PCT: 0 %
Basophils Absolute: 0 10*3/uL (ref 0.0–0.1)
EOS ABS: 0.2 10*3/uL (ref 0.0–0.5)
EOS PCT: 3 %
HCT: 39.4 % (ref 38.4–49.9)
Hemoglobin: 13.4 g/dL (ref 13.0–17.1)
LYMPHS ABS: 1.2 10*3/uL (ref 0.9–3.3)
Lymphocytes Relative: 24 %
MCH: 29.7 pg (ref 27.2–33.4)
MCHC: 34 g/dL (ref 32.0–36.0)
MCV: 87.4 fL (ref 79.3–98.0)
MONOS PCT: 6 %
Monocytes Absolute: 0.3 10*3/uL (ref 0.1–0.9)
NEUTROS PCT: 67 %
Neutro Abs: 3.4 10*3/uL (ref 1.5–6.5)
PLATELETS: 166 10*3/uL (ref 140–400)
RBC: 4.51 MIL/uL (ref 4.20–5.82)
RDW: 13.4 % (ref 11.0–14.6)
WBC: 5 10*3/uL (ref 4.0–10.3)

## 2017-07-04 LAB — CMP (CANCER CENTER ONLY)
ALBUMIN: 3.8 g/dL (ref 3.5–5.0)
ALK PHOS: 123 U/L (ref 38–126)
ALT: 27 U/L (ref 0–44)
ANION GAP: 8 (ref 5–15)
AST: 13 U/L — ABNORMAL LOW (ref 15–41)
BUN: 13 mg/dL (ref 6–20)
CALCIUM: 9.2 mg/dL (ref 8.9–10.3)
CO2: 26 mmol/L (ref 22–32)
Chloride: 105 mmol/L (ref 98–111)
Creatinine: 0.88 mg/dL (ref 0.61–1.24)
GFR, Estimated: >60 mL/min (ref >60)
Glucose, Bld: 135 mg/dL — ABNORMAL HIGH (ref 70–99)
POTASSIUM: 3.7 mmol/L (ref 3.5–5.1)
SODIUM: 139 mmol/L (ref 135–145)
Total Bilirubin: 0.4 mg/dL (ref 0.3–1.2)
Total Protein: 7 g/dL (ref 6.5–8.1)

## 2017-07-04 MED ORDER — PALONOSETRON HCL INJECTION 0.25 MG/5ML
INTRAVENOUS | Status: AC
  Filled 2017-07-04: qty 5

## 2017-07-04 MED ORDER — DEXTROSE 5 % IV SOLN
Freq: Once | INTRAVENOUS | Status: AC
  Administered 2017-07-04: 12:00:00 via INTRAVENOUS

## 2017-07-04 MED ORDER — SODIUM CHLORIDE 0.9 % IV SOLN
2400.0000 mg/m2 | INTRAVENOUS | Status: DC
  Administered 2017-07-04: 5350 mg via INTRAVENOUS
  Filled 2017-07-04: qty 107

## 2017-07-04 MED ORDER — PALONOSETRON HCL INJECTION 0.25 MG/5ML
0.2500 mg | Freq: Once | INTRAVENOUS | Status: AC
  Administered 2017-07-04: 0.25 mg via INTRAVENOUS

## 2017-07-04 MED ORDER — OXALIPLATIN CHEMO INJECTION 100 MG/20ML
89.0000 mg/m2 | Freq: Once | INTRAVENOUS | Status: AC
  Administered 2017-07-04: 200 mg via INTRAVENOUS
  Filled 2017-07-04: qty 40

## 2017-07-04 MED ORDER — LEUCOVORIN CALCIUM INJECTION 350 MG
400.0000 mg/m2 | Freq: Once | INTRAVENOUS | Status: AC
  Administered 2017-07-04: 888 mg via INTRAVENOUS
  Filled 2017-07-04: qty 44.4

## 2017-07-04 MED ORDER — DEXAMETHASONE SODIUM PHOSPHATE 10 MG/ML IJ SOLN
10.0000 mg | Freq: Once | INTRAMUSCULAR | Status: AC
  Administered 2017-07-04: 10 mg via INTRAVENOUS

## 2017-07-04 MED ORDER — DEXAMETHASONE SODIUM PHOSPHATE 10 MG/ML IJ SOLN
INTRAMUSCULAR | Status: AC
  Filled 2017-07-04: qty 1

## 2017-07-04 MED ORDER — FLUOROURACIL CHEMO INJECTION 2.5 GM/50ML
400.0000 mg/m2 | Freq: Once | INTRAVENOUS | Status: AC
  Administered 2017-07-04: 900 mg via INTRAVENOUS
  Filled 2017-07-04: qty 18

## 2017-07-04 MED ORDER — SODIUM CHLORIDE 0.9% FLUSH
10.0000 mL | INTRAVENOUS | Status: DC | PRN
  Administered 2017-07-04: 10 mL
  Filled 2017-07-04: qty 10

## 2017-07-04 NOTE — Progress Notes (Signed)
  Charles [name] OFFICE PROGRESS NOTE   Diagnosis: Rectal cancer  INTERVAL HISTORY:   Charles [name] returns as scheduled.  He completed cycle 1 FOLFOX 06/20/2017.  He denies significant nausea/vomiting.  He noted a few areas of tenderness in his mouth which have resolved.  Minimal loose stools.  Cold sensitivity lasted about 3 days.  He notes intermittent numbness of the lower lip which predated the start of chemotherapy. He has noted some "splotches" on his right arm.  Objective:  Vital signs in last 24 hours:  Blood pressure 120/75, pulse (!) 108, temperature 98 F (36.7 C), temperature source Oral, resp. rate 18, height 5\' 8"  (1.727 m), weight 223 lb 8 oz (101.4 kg), SpO2 97 %.    HEENT: Mild white coating over tongue.  No buccal thrush.  No ulcers. Resp: Lungs clear bilaterally. Cardio: Regular rate and rhythm. GI: Abdomen soft and nontender.  No hepatomegaly. Vascular: No leg edema.  Skin: Palms with hyperpigmentation.  No erythema.  A few flat hyperpigmented lesions right arm.  Area of linear hyperpigmentation left chest. Port-A-Cath without erythema.   Lab Results:  Lab Results  Component Value Date   WBC 5.0 07/04/2017   HGB 13.4 07/04/2017   HCT 39.4 07/04/2017   MCV 87.4 07/04/2017   PLT 166 07/04/2017   NEUTROABS 3.4 07/04/2017    Imaging:  No results found.  Medications: I have reviewed the patient's current medications.  Assessment/Plan: 1. Rectal cancer, clinical stage III ? Colonoscopy 05/24/2017, mass at 12 cm from the anal verge, biopsy confirmed invasive poorly differentiated adenocarcinoma ? CTs 06/06/2017-rectal mass, perirectal adenopathy, no evidence of metastatic disease, nonspecific 1 cm sclerotic lesion at the right iliac crest ? MRI 06/06/2017, T3cN1 tumor measured at 5.3 cm from the anal sphincter ? Cycle 1 FOLFOX 06/20/2017 ? Cycle 2 FOLFOX 07/04/2017  2. Diabetes 3. Glaucoma 4. Cecal polyp, tubular adenoma noted on the  colonoscopy 05/24/2017 5. Port-A-Cath placement, Dr. Dema Severin, 06/17/2017    Disposition: Charles [name] appears stable.  He has completed 1 cycle of FOLFOX.  Overall he seems to have tolerated well.  Plan to proceed with cycle 2 today as scheduled.  We made a referral to the genetics counselor.  He will return for lab, follow-up and cycle 3 FOLFOX in 2 weeks.  He will contact the office in the interim with any problems.  Patient seen with Dr. Benay Spice.  [name] ANP/GNP-BC   07/04/2017  10:50 AM

## 2017-07-04 NOTE — Telephone Encounter (Signed)
Printed avs and calender of upcoming appointment. Per 6/27 los 

## 2017-07-04 NOTE — Patient Instructions (Signed)
[facility] Discharge Instructions for Patients Receiving Chemotherapy  Today you received the following chemotherapy agents: Leucovorin, Oxaliplatin, and 5FU (Fluorouracil).  To help prevent nausea and vomiting after your treatment, we encourage you to take your nausea medication as directed.   If you develop nausea and vomiting that is not controlled by your nausea medication, call the clinic.   BELOW ARE SYMPTOMS THAT SHOULD BE REPORTED IMMEDIATELY:  *FEVER GREATER THAN 100.5 F  *CHILLS WITH OR WITHOUT FEVER  NAUSEA AND VOMITING THAT IS NOT CONTROLLED WITH YOUR NAUSEA MEDICATION  *UNUSUAL SHORTNESS OF BREATH  *UNUSUAL BRUISING OR BLEEDING  TENDERNESS IN MOUTH AND THROAT WITH OR WITHOUT PRESENCE OF ULCERS  *URINARY PROBLEMS  *BOWEL PROBLEMS  UNUSUAL RASH Items with * indicate a potential emergency and should be followed up as soon as possible.  Feel free to call the clinic should you have any questions or concerns. The clinic phone number is [phone].  Please show the [facility] at check-in to the Emergency Department and triage nurse.   Oxaliplatin Injection What is this medicine? OXALIPLATIN (ox AL i PLA tin) is a chemotherapy drug. It targets fast dividing cells, like cancer cells, and causes these cells to die. This medicine is used to treat cancers of the colon and rectum, and many other cancers. This medicine may be used for other purposes; ask your health care provider or pharmacist if you have questions. COMMON BRAND NAME(S): Eloxatin What should I tell my health care provider before I take this medicine? They need to know if you have any of these conditions: -kidney disease -an unusual or allergic reaction to oxaliplatin, other chemotherapy, other medicines, foods, dyes, or preservatives -pregnant or trying to get pregnant -breast-feeding How should I use this medicine? This drug is given as an infusion into a vein. It is  administered in a hospital or clinic by a specially trained health care professional. Talk to your pediatrician regarding the use of this medicine in children. Special care may be needed. Overdosage: If you think you have taken too much of this medicine contact a poison control center or emergency room at once. NOTE: This medicine is only for you. Do not share this medicine with others. What if I miss a dose? It is important not to miss a dose. Call your doctor or health care professional if you are unable to keep an appointment. What may interact with this medicine? -medicines to increase blood counts like filgrastim, pegfilgrastim, sargramostim -probenecid -some antibiotics like amikacin, gentamicin, neomycin, polymyxin B, streptomycin, tobramycin -zalcitabine Talk to your doctor or health care professional before taking any of these medicines: -acetaminophen -aspirin -ibuprofen -ketoprofen -naproxen This list may not describe all possible interactions. Give your health care provider a list of all the medicines, herbs, non-prescription drugs, or dietary supplements you use. Also tell them if you smoke, drink alcohol, or use illegal drugs. Some items may interact with your medicine. What should I watch for while using this medicine? Your condition will be monitored carefully while you are receiving this medicine. You will need important blood work done while you are taking this medicine. This medicine can make you more sensitive to cold. Do not drink cold drinks or use ice. Cover exposed skin before coming in contact with cold temperatures or cold objects. When out in cold weather wear warm clothing and cover your mouth and nose to warm the air that goes into your lungs. Tell your doctor if you get sensitive to  the cold. This drug may make you feel generally unwell. This is not uncommon, as chemotherapy can affect healthy cells as well as cancer cells. Report any side effects. Continue your  course of treatment even though you feel ill unless your doctor tells you to stop. In some cases, you may be given additional medicines to help with side effects. Follow all directions for their use. Call your doctor or health care professional for advice if you get a fever, chills or sore throat, or other symptoms of a cold or flu. Do not treat yourself. This drug decreases your body's ability to fight infections. Try to avoid being around people who are sick. This medicine may increase your risk to bruise or bleed. Call your doctor or health care professional if you notice any unusual bleeding. Be careful brushing and flossing your teeth or using a toothpick because you may get an infection or bleed more easily. If you have any dental work done, tell your dentist you are receiving this medicine. Avoid taking products that contain aspirin, acetaminophen, ibuprofen, naproxen, or ketoprofen unless instructed by your doctor. These medicines may hide a fever. Do not become pregnant while taking this medicine. Women should inform their doctor if they wish to become pregnant or think they might be pregnant. There is a potential for serious side effects to an unborn child. Talk to your health care professional or pharmacist for more information. Do not breast-feed an infant while taking this medicine. Call your doctor or health care professional if you get diarrhea. Do not treat yourself. What side effects may I notice from receiving this medicine? Side effects that you should report to your doctor or health care professional as soon as possible: -allergic reactions like skin rash, itching or hives, swelling of the face, lips, or tongue -low blood counts - This drug may decrease the number of white blood cells, red blood cells and platelets. You may be at increased risk for infections and bleeding. -signs of infection - fever or chills, cough, sore throat, pain or difficulty passing urine -signs of decreased  platelets or bleeding - bruising, pinpoint red spots on the skin, black, tarry stools, nosebleeds -signs of decreased red blood cells - unusually weak or tired, fainting spells, lightheadedness -breathing problems -chest pain, pressure -cough -diarrhea -jaw tightness -mouth sores -nausea and vomiting -pain, swelling, redness or irritation at the injection site -pain, tingling, numbness in the hands or feet -problems with balance, talking, walking -redness, blistering, peeling or loosening of the skin, including inside the mouth -trouble passing urine or change in the amount of urine Side effects that usually do not require medical attention (report to your doctor or health care professional if they continue or are bothersome): -changes in vision -constipation -hair loss -loss of appetite -metallic taste in the mouth or changes in taste -stomach pain This list may not describe all possible side effects. Call your doctor for medical advice about side effects. You may report side effects to FDA at 1-800-FDA-1088. Where should I keep my medicine? This drug is given in a hospital or clinic and will not be stored at home. NOTE: This sheet is a summary. It may not cover all possible information. If you have questions about this medicine, talk to your doctor, pharmacist, or health care provider.  2018 Elsevier/Gold Standard (2007-07-22 17:22:47)   Leucovorin injection What is this medicine? LEUCOVORIN (loo koe VOR in) is used to prevent or treat the harmful effects of some medicines. This medicine is  used to treat anemia caused by a low amount of folic acid in the body. It is also used with 5-fluorouracil (5-FU) to treat colon cancer. This medicine may be used for other purposes; ask your health care provider or pharmacist if you have questions. What should I tell my health care provider before I take this medicine? They need to know if you have any of these conditions: -anemia from low  levels of vitamin B-12 in the blood -an unusual or allergic reaction to leucovorin, folic acid, other medicines, foods, dyes, or preservatives -pregnant or trying to get pregnant -breast-feeding How should I use this medicine? This medicine is for injection into a muscle or into a vein. It is given by a health care professional in a hospital or clinic setting. Talk to your pediatrician regarding the use of this medicine in children. Special care may be needed. Overdosage: If you think you have taken too much of this medicine contact a poison control center or emergency room at once. NOTE: This medicine is only for you. Do not share this medicine with others. What if I miss a dose? This does not apply. What may interact with this medicine? -capecitabine -fluorouracil -phenobarbital -phenytoin -primidone -trimethoprim-sulfamethoxazole This list may not describe all possible interactions. Give your health care provider a list of all the medicines, herbs, non-prescription drugs, or dietary supplements you use. Also tell them if you smoke, drink alcohol, or use illegal drugs. Some items may interact with your medicine. What should I watch for while using this medicine? Your condition will be monitored carefully while you are receiving this medicine. This medicine may increase the side effects of 5-fluorouracil, 5-FU. Tell your doctor or health care professional if you have diarrhea or mouth sores that do not get better or that get worse. What side effects may I notice from receiving this medicine? Side effects that you should report to your doctor or health care professional as soon as possible: -allergic reactions like skin rash, itching or hives, swelling of the face, lips, or tongue -breathing problems -fever, infection -mouth sores -unusual bleeding or bruising -unusually weak or tired Side effects that usually do not require medical attention (report to your doctor or health care  professional if they continue or are bothersome): -constipation or diarrhea -loss of appetite -nausea, vomiting This list may not describe all possible side effects. Call your doctor for medical advice about side effects. You may report side effects to FDA at 1-800-FDA-1088. Where should I keep my medicine? This drug is given in a hospital or clinic and will not be stored at home. NOTE: This sheet is a summary. It may not cover all possible information. If you have questions about this medicine, talk to your doctor, pharmacist, or health care provider.  2018 Elsevier/Gold Standard (2007-07-01 16:50:29)  Fluorouracil, 5-FU injection What is this medicine? FLUOROURACIL, 5-FU (flure oh YOOR a sil) is a chemotherapy drug. It slows the growth of cancer cells. This medicine is used to treat many types of cancer like breast cancer, colon or rectal cancer, pancreatic cancer, and stomach cancer. This medicine may be used for other purposes; ask your health care provider or pharmacist if you have questions. COMMON BRAND NAME(S): Adrucil What should I tell my health care provider before I take this medicine? They need to know if you have any of these conditions: -blood disorders -dihydropyrimidine dehydrogenase (DPD) deficiency -infection (especially a virus infection such as chickenpox, cold sores, or herpes) -kidney disease -liver disease -malnourished, poor  nutrition -recent or ongoing radiation therapy -an unusual or allergic reaction to fluorouracil, other chemotherapy, other medicines, foods, dyes, or preservatives -pregnant or trying to get pregnant -breast-feeding How should I use this medicine? This drug is given as an infusion or injection into a vein. It is administered in a hospital or clinic by a specially trained health care professional. Talk to your pediatrician regarding the use of this medicine in children. Special care may be needed. Overdosage: If you think you have taken too  much of this medicine contact a poison control center or emergency room at once. NOTE: This medicine is only for you. Do not share this medicine with others. What if I miss a dose? It is important not to miss your dose. Call your doctor or health care professional if you are unable to keep an appointment. What may interact with this medicine? -allopurinol -cimetidine -dapsone -digoxin -hydroxyurea -leucovorin -levamisole -medicines for seizures like ethotoin, fosphenytoin, phenytoin -medicines to increase blood counts like filgrastim, pegfilgrastim, sargramostim -medicines that treat or prevent blood clots like warfarin, enoxaparin, and dalteparin -methotrexate -metronidazole -pyrimethamine -some other chemotherapy drugs like busulfan, cisplatin, estramustine, vinblastine -trimethoprim -trimetrexate -vaccines Talk to your doctor or health care professional before taking any of these medicines: -acetaminophen -aspirin -ibuprofen -ketoprofen -naproxen This list may not describe all possible interactions. Give your health care provider a list of all the medicines, herbs, non-prescription drugs, or dietary supplements you use. Also tell them if you smoke, drink alcohol, or use illegal drugs. Some items may interact with your medicine. What should I watch for while using this medicine? Visit your doctor for checks on your progress. This drug may make you feel generally unwell. This is not uncommon, as chemotherapy can affect healthy cells as well as cancer cells. Report any side effects. Continue your course of treatment even though you feel ill unless your doctor tells you to stop. In some cases, you may be given additional medicines to help with side effects. Follow all directions for their use. Call your doctor or health care professional for advice if you get a fever, chills or sore throat, or other symptoms of a cold or flu. Do not treat yourself. This drug decreases your body's  ability to fight infections. Try to avoid being around people who are sick. This medicine may increase your risk to bruise or bleed. Call your doctor or health care professional if you notice any unusual bleeding. Be careful brushing and flossing your teeth or using a toothpick because you may get an infection or bleed more easily. If you have any dental work done, tell your dentist you are receiving this medicine. Avoid taking products that contain aspirin, acetaminophen, ibuprofen, naproxen, or ketoprofen unless instructed by your doctor. These medicines may hide a fever. Do not become pregnant while taking this medicine. Women should inform their doctor if they wish to become pregnant or think they might be pregnant. There is a potential for serious side effects to an unborn child. Talk to your health care professional or pharmacist for more information. Do not breast-feed an infant while taking this medicine. Men should inform their doctor if they wish to father a child. This medicine may lower sperm counts. Do not treat diarrhea with over the counter products. Contact your doctor if you have diarrhea that lasts more than 2 days or if it is severe and watery. This medicine can make you more sensitive to the sun. Keep out of the sun. If you cannot avoid being  in the sun, wear protective clothing and use sunscreen. Do not use sun lamps or tanning beds/booths. What side effects may I notice from receiving this medicine? Side effects that you should report to your doctor or health care professional as soon as possible: -allergic reactions like skin rash, itching or hives, swelling of the face, lips, or tongue -low blood counts - this medicine may decrease the number of white blood cells, red blood cells and platelets. You may be at increased risk for infections and bleeding. -signs of infection - fever or chills, cough, sore throat, pain or difficulty passing urine -signs of decreased platelets or  bleeding - bruising, pinpoint red spots on the skin, black, tarry stools, blood in the urine -signs of decreased red blood cells - unusually weak or tired, fainting spells, lightheadedness -breathing problems -changes in vision -chest pain -mouth sores -nausea and vomiting -pain, swelling, redness at site where injected -pain, tingling, numbness in the hands or feet -redness, swelling, or sores on hands or feet -stomach pain -unusual bleeding Side effects that usually do not require medical attention (report to your doctor or health care professional if they continue or are bothersome): -changes in finger or toe nails -diarrhea -dry or itchy skin -hair loss -headache -loss of appetite -sensitivity of eyes to the light -stomach upset -unusually teary eyes This list may not describe all possible side effects. Call your doctor for medical advice about side effects. You may report side effects to FDA at 1-800-FDA-1088. Where should I keep my medicine? This drug is given in a hospital or clinic and will not be stored at home. NOTE: This sheet is a summary. It may not cover all possible information. If you have questions about this medicine, talk to your doctor, pharmacist, or health care provider.  2018 Elsevier/Gold Standard (2007-04-30 13:53:16)

## 2017-07-05 DIAGNOSIS — E1165 Type 2 diabetes mellitus with hyperglycemia: Secondary | ICD-10-CM | POA: Diagnosis not present

## 2017-07-05 DIAGNOSIS — C20 Malignant neoplasm of rectum: Secondary | ICD-10-CM | POA: Diagnosis not present

## 2017-07-06 ENCOUNTER — Inpatient Hospital Stay: Payer: 59

## 2017-07-06 VITALS — BP 135/69 | HR 115 | Temp 98.0°F | Resp 18

## 2017-07-06 DIAGNOSIS — C20 Malignant neoplasm of rectum: Secondary | ICD-10-CM

## 2017-07-06 DIAGNOSIS — Z5111 Encounter for antineoplastic chemotherapy: Secondary | ICD-10-CM | POA: Diagnosis not present

## 2017-07-06 MED ORDER — SODIUM CHLORIDE 0.9% FLUSH
10.0000 mL | INTRAVENOUS | Status: DC | PRN
Start: 2017-07-06 — End: 2017-07-06
  Administered 2017-07-06: 10 mL
  Filled 2017-07-06: qty 10

## 2017-07-06 MED ORDER — HEPARIN SOD (PORK) LOCK FLUSH 100 UNIT/ML IV SOLN
500.0000 [IU] | Freq: Once | INTRAVENOUS | Status: AC | PRN
  Administered 2017-07-06: 500 [IU]
  Filled 2017-07-06: qty 5

## 2017-07-06 NOTE — Patient Instructions (Signed)
Implanted Port Home Guide An implanted port is a type of central line that is placed under the skin. Central lines are used to provide IV access when treatment or nutrition needs to be given through a person's veins. Implanted ports are used for long-term IV access. An implanted port may be placed because:  You need IV medicine that would be irritating to the small veins in your hands or arms.  You need long-term IV medicines, such as antibiotics.  You need IV nutrition for a long period.  You need frequent blood draws for lab tests.  You need dialysis.  Implanted ports are usually placed in the chest area, but they can also be placed in the upper arm, the abdomen, or the leg. An implanted port has two main parts:  Reservoir. The reservoir is round and will appear as a small, raised area under your skin. The reservoir is the part where a needle is inserted to give medicines or draw blood.  Catheter. The catheter is a thin, flexible tube that extends from the reservoir. The catheter is placed into a large vein. Medicine that is inserted into the reservoir goes into the catheter and then into the vein.  How will I care for my incision site? Do not get the incision site wet. Bathe or shower as directed by your health care provider. How is my port accessed? Special steps must be taken to access the port:  Before the port is accessed, a numbing cream can be placed on the skin. This helps numb the skin over the port site.  Your health care provider uses a sterile technique to access the port. ? Your health care provider must put on a mask and sterile gloves. ? The skin over your port is cleaned carefully with an antiseptic and allowed to dry. ? The port is gently pinched between sterile gloves, and a needle is inserted into the port.  Only "non-coring" port needles should be used to access the port. Once the port is accessed, a blood return should be checked. This helps ensure that the port  is in the vein and is not clogged.  If your port needs to remain accessed for a constant infusion, a clear (transparent) bandage will be placed over the needle site. The bandage and needle will need to be changed every week, or as directed by your health care provider.  Keep the bandage covering the needle clean and dry. Do not get it wet. Follow your health care provider's instructions on how to take a shower or bath while the port is accessed.  If your port does not need to stay accessed, no bandage is needed over the port.  What is flushing? Flushing helps keep the port from getting clogged. Follow your health care provider's instructions on how and when to flush the port. Ports are usually flushed with saline solution or a medicine called heparin. The need for flushing will depend on how the port is used.  If the port is used for intermittent medicines or blood draws, the port will need to be flushed: ? After medicines have been given. ? After blood has been drawn. ? As part of routine maintenance.  If a constant infusion is running, the port may not need to be flushed.  How long will my port stay implanted? The port can stay in for as long as your health care provider thinks it is needed. When it is time for the port to come out, surgery will be   done to remove it. The procedure is similar to the one performed when the port was put in. When should I seek immediate medical care? When you have an implanted port, you should seek immediate medical care if:  You notice a bad smell coming from the incision site.  You have swelling, redness, or drainage at the incision site.  You have more swelling or pain at the port site or the surrounding area.  You have a fever that is not controlled with medicine.  This information is not intended to replace advice given to you by your health care provider. Make sure you discuss any questions you have with your health care provider. Document  Released: 12/25/2004 Document Revised: 06/02/2015 Document Reviewed: 09/01/2012 Elsevier Interactive Patient Education  2017 [employer] Inc[employer]  

## 2017-07-09 ENCOUNTER — Ambulatory Visit: Payer: 59 | Attending: Oncology | Admitting: Physical Therapy

## 2017-07-09 ENCOUNTER — Encounter: Payer: Self-pay | Admitting: Physical Therapy

## 2017-07-09 DIAGNOSIS — R279 Unspecified lack of coordination: Secondary | ICD-10-CM

## 2017-07-09 DIAGNOSIS — M6281 Muscle weakness (generalized): Secondary | ICD-10-CM | POA: Diagnosis not present

## 2017-07-09 NOTE — Patient Instructions (Signed)
Access Code: [email or web address]  URL: [email or web address]  Date: 07/09/2017  Prepared by: Lovett Calender   Exercises  Supine Single Knee to Chest Stretch - 5 reps - 1 sets - 10 sec hold - 2x daily - 7x weekly  Supine Piriformis Stretch - 3 reps - 1 sets - 30 sec hold - 1x daily - 7x weekly  Bent Knee Fallouts - 10 reps - 1 sets - 2x daily - 7x weekly  Hooklying Clamshell with Resistance - 10 reps - 1 sets - 3 sec hold - 2x daily - 7x weekly  Bridge - 10 reps - 1 sets - 3 sec hold - 2x daily - 7x weekly  Seated Piriformis Stretch with Trunk Bend - 3 reps - 1 sets - 30 sec hold - 2x daily - 7x weekly  Standing Shoulder Row with Anchored Resistance - 10 reps - 2 sets - 2x daily - 7x weekly  Shoulder Extension with Resistance - Neutral - 10 reps - 2 sets - 2x daily - 7x weekly  Walking Tandem Stance - 10 reps - 3 sets - 1x daily - 7x weekly  Alternating Single Leg Balance - Foot Behind - 10 reps - 3 sets - 1x daily - 7x weekly  Single Leg Balance with Opposite Leg Star Reach - 10 reps - 3 sets - 1x daily - 7x weekly  Supine Hamstring Stretch with Strap - 10 reps - 3 sets - 1x daily - 7x weekly  Supine ITB Stretch with Strap - 10 reps - 3 sets - 1x daily - 7x weekly  Sit to Stand with Pelvic Floor Contraction - 10 reps - 3 sets - 1x daily - 7x weekly  Swiss Ball Knee Extension - 10 reps - 3 sets - 1x daily - 7x weekly

## 2017-07-09 NOTE — Therapy (Signed)
[facility] [address], [place], [place], [address] Phone: [phone]   Fax:  [phone]  Physical Therapy Treatment  Patient Details  Name: Charles [name] MRN: [id number] Date of Birth: 1965-05-17 Referring Provider: Ladell Pier, MD   Encounter Date: 07/09/2017  PT End of Session - 07/09/17 0850    Visit Number  4    Date for PT Re-Evaluation  09/18/17    PT Start Time  0846    PT Stop Time  0927    PT Time Calculation (min)  41 min    Activity Tolerance  Patient tolerated treatment well    Behavior During Therapy  [facility] for tasks assessed/performed       Past Medical History:  Diagnosis Date  . Diabetes mellitus without complication ([place])    Type II  . Hyperlipidemia     Past Surgical History:  Procedure Laterality Date  . ACHILLES TENDON REPAIR Right 2005  . APPENDECTOMY    . HERNIA REPAIR     as a baby  . PORTACATH PLACEMENT N/A 06/17/2017   Procedure: RIGHT VS LEFT INTERNAL JUGULAR PORT-A-CATH PLACEMENT WITH ULTRASOUND;  Surgeon: Ileana Roup, MD;  Location: [facility];  Service: General;  Laterality: N/A;  FLURO    There were no vitals filed for this visit.  Subjective Assessment - 07/09/17 0849    Subjective  Pt was very fatigued after the chemo last week.  States he is feeling better today.      Patient Stated Goals  know what to do for strengthening    Currently in Pain?  No/denies                       [facility] Adult PT Treatment/Exercise - 07/09/17 0001      Lumbar Exercises: Stretches   Active Hamstring Stretch  Right;Left;2 reps;20 seconds    Piriformis Stretch  Right;Left;5 reps;10 seconds using ball to roll on      Lumbar Exercises: Aerobic   Nustep  L1 x 8 min; seat 11; UE 12 PT present to discuss progress      Lumbar Exercises: Seated   Long Arc Quad on [place]  Strengthening;Both;10 reps    Hip Flexion on [place]  Strengthening;Both;20 reps    Sit to Stand  20  reps cues to activate TrA    Other Seated Lumbar Exercises  seated on ball - UE flex 2lb, exension 15, rows 20 lb - 20x each      Lumbar Exercises: Supine   Large Ball Abdominal Isometric  20 reps raising red pball overhead             PT Education - 07/09/17 0931    Education Details   Access Code: [email or web address]     Person(s) Educated  Patient    Methods  Explanation;Demonstration;Verbal cues;Handout    Comprehension  Verbalized understanding;Returned demonstration       PT Short Term Goals - 07/09/17 5638      PT SHORT TERM GOAL #1   Title  ind with toileting techniques for healthy BM    Time  3    Period  Weeks    Status  Achieved      PT SHORT TERM GOAL #2   Title  ind with HEP    Baseline  initial HEP    Time  3    Period  Weeks    Status  Achieved  PT Long Term Goals - 06/26/17 1223      PT LONG TERM GOAL #1   Title  Able to coordinate pelvic floor for contract, relax, and bulge correctly and 3/5 MMT for pelvic floor contraction    Time  12    Period  Weeks    Status  New    Target Date  09/18/17      PT LONG TERM GOAL #2   Title  Pt will demonstrate 6MWT of 1300 feet or more for improved endurance with walking routine    Time  12    Period  Weeks    Status  New    Target Date  09/18/17      PT LONG TERM GOAL #3   Title  pt will demonstrate at least 20 mV contraction of pelvic floor and able to hold 10 sec for 5 reps    Time  12    Period  Weeks    Status  New    Target Date  09/18/17      PT LONG TERM GOAL #4   Title  pt will demonstrate 5 times sit to stand 10 seconds or less due to improved and maintained strength with exercise program    Time  12    Period  Weeks    Status  New    Target Date  09/18/17      PT LONG TERM GOAL #5   Title  Pt will report ability to have complete bowel movement without excessive bearing down throughout cancer treatments due to good muscle strength and coordination    Time  12    Period  Weeks    Status   New    Target Date  09/18/17            Plan - 07/09/17 0855    Clinical Impression Statement  Pt was more fatigued today.  He was able to perform exercises correctly with progression of core and pelvic floor strengthening on the ball.  Pt did well with using ball to feel pelvic floor and engage core muscles.  Pt will continue POC for increased strength and endurance.    PT Treatment/Interventions  ADLs/Self Care Home Management;Biofeedback;Cryotherapy;Electrical Stimulation;Moist Heat;Gait training;Stair training;Functional mobility training;Therapeutic activities;Therapeutic exercise;Balance training;Neuromuscular re-education;Patient/family education;Manual techniques;Passive range of motion;Dry needling;Taping    PT Home Exercise Plan   Access Code: [email or web address]     Consulted and Agree with Plan of Care  Patient       Patient will benefit from skilled therapeutic intervention in order to improve the following deficits and impairments:  Decreased strength, Decreased coordination, Impaired tone, Decreased endurance, Postural dysfunction  Visit Diagnosis: Muscle weakness (generalized)  Unspecified lack of coordination     Problem List Patient Active Problem List   Diagnosis Date Noted  . Port-A-Cath in place 06/20/2017  . Rectal cancer ([place]) 06/12/2017  . Other and unspecified hyperlipidemia 06/19/2013  . Type II or unspecified type diabetes mellitus with neurological manifestations, not stated as uncontrolled(250.60) 06/19/2013    Zannie Cove, PT 07/09/2017, 9:38 AM  [facility] [address], [place], [place], [address] Phone: [phone]   Fax:  [phone]  Name: Charles [name] MRN: [id number] Date of Birth: [birth year]

## 2017-07-13 ENCOUNTER — Other Ambulatory Visit: Payer: Self-pay | Admitting: Oncology

## 2017-07-16 ENCOUNTER — Other Ambulatory Visit: Payer: Self-pay | Admitting: Medical Oncology

## 2017-07-17 ENCOUNTER — Encounter: Payer: Self-pay | Admitting: Nurse Practitioner

## 2017-07-17 ENCOUNTER — Inpatient Hospital Stay: Payer: 59 | Attending: Oncology | Admitting: Nurse Practitioner

## 2017-07-17 ENCOUNTER — Inpatient Hospital Stay: Payer: 59

## 2017-07-17 VITALS — BP 129/81 | HR 111 | Temp 98.4°F | Resp 18 | Ht 68.0 in | Wt 221.6 lb

## 2017-07-17 DIAGNOSIS — D61818 Other pancytopenia: Secondary | ICD-10-CM | POA: Insufficient documentation

## 2017-07-17 DIAGNOSIS — C20 Malignant neoplasm of rectum: Secondary | ICD-10-CM

## 2017-07-17 DIAGNOSIS — E119 Type 2 diabetes mellitus without complications: Secondary | ICD-10-CM | POA: Diagnosis not present

## 2017-07-17 DIAGNOSIS — R110 Nausea: Secondary | ICD-10-CM | POA: Diagnosis not present

## 2017-07-17 DIAGNOSIS — Z95828 Presence of other vascular implants and grafts: Secondary | ICD-10-CM

## 2017-07-17 DIAGNOSIS — L819 Disorder of pigmentation, unspecified: Secondary | ICD-10-CM | POA: Diagnosis not present

## 2017-07-17 DIAGNOSIS — E876 Hypokalemia: Secondary | ICD-10-CM | POA: Diagnosis not present

## 2017-07-17 DIAGNOSIS — Z5111 Encounter for antineoplastic chemotherapy: Secondary | ICD-10-CM | POA: Diagnosis not present

## 2017-07-17 LAB — CMP (CANCER CENTER ONLY)
ALT: 33 U/L (ref 0–44)
ANION GAP: 9 (ref 5–15)
AST: 16 U/L (ref 15–41)
Albumin: 3.9 g/dL (ref 3.5–5.0)
Alkaline Phosphatase: 133 U/L — ABNORMAL HIGH (ref 38–126)
BUN: 11 mg/dL (ref 6–20)
CHLORIDE: 104 mmol/L (ref 98–111)
CO2: 28 mmol/L (ref 22–32)
Calcium: 9.6 mg/dL (ref 8.9–10.3)
Creatinine: 1.02 mg/dL (ref 0.61–1.24)
GFR, Est AFR Am: >60 mL/min (ref >60)
Glucose, Bld: 197 mg/dL — ABNORMAL HIGH (ref 70–99)
POTASSIUM: 3.6 mmol/L (ref 3.5–5.1)
SODIUM: 141 mmol/L (ref 135–145)
TOTAL PROTEIN: 7 g/dL (ref 6.5–8.1)
Total Bilirubin: 0.7 mg/dL (ref 0.3–1.2)

## 2017-07-17 LAB — CBC WITH DIFFERENTIAL (CANCER CENTER ONLY)
BASOS ABS: 0 10*3/uL (ref 0.0–0.1)
Basophils Relative: 1 %
EOS PCT: 2 %
Eosinophils Absolute: 0.1 10*3/uL (ref 0.0–0.5)
HEMATOCRIT: 39 % (ref 38.4–49.9)
HEMOGLOBIN: 13.4 g/dL (ref 13.0–17.1)
LYMPHS ABS: 1.4 10*3/uL (ref 0.9–3.3)
LYMPHS PCT: 32 %
MCH: 29.6 pg (ref 27.2–33.4)
MCHC: 34.4 g/dL (ref 32.0–36.0)
MCV: 86.3 fL (ref 79.3–98.0)
Monocytes Absolute: 0.5 10*3/uL (ref 0.1–0.9)
Monocytes Relative: 11 %
NEUTROS ABS: 2.4 10*3/uL (ref 1.5–6.5)
NEUTROS PCT: 54 %
Platelet Count: 135 10*3/uL — ABNORMAL LOW (ref 140–400)
RBC: 4.52 MIL/uL (ref 4.20–5.82)
RDW: 13.9 % (ref 11.0–14.6)
WBC: 4.4 10*3/uL (ref 4.0–10.3)

## 2017-07-17 MED ORDER — SODIUM CHLORIDE 0.9% FLUSH
10.0000 mL | INTRAVENOUS | Status: DC | PRN
  Administered 2017-07-17: 10 mL
  Filled 2017-07-17: qty 10

## 2017-07-17 MED ORDER — HEPARIN SOD (PORK) LOCK FLUSH 100 UNIT/ML IV SOLN
500.0000 [IU] | Freq: Once | INTRAVENOUS | Status: AC | PRN
  Administered 2017-07-17: 500 [IU]
  Filled 2017-07-17: qty 5

## 2017-07-17 NOTE — Progress Notes (Signed)
  [facility] OFFICE PROGRESS NOTE   Diagnosis: Rectal cancer  INTERVAL HISTORY:   Charles [name] returns as scheduled.  He completed cycle 2 FOLFOX 07/04/2017.  No significant nausea.  No mouth sores.  No diarrhea.  He had crampy abdominal pain beginning during the Oxaliplatin infusion and continuing intermittently for 4 to 5 days.  He noted that he went to the bathroom more frequently during this time but did not have diarrhea.  Cold sensitivity lasted about 5 days.  He felt fatigued some days.  He has noted slower hair growth.  He also notes darkening over the palms.  Objective:  Vital signs in last 24 hours:  Blood pressure 129/81, pulse (!) 111, temperature 98.4 F (36.9 C), temperature source Oral, resp. rate 18, height 5\' 8"  (1.727 m), weight 221 lb 9.6 oz (100.5 kg), SpO2 99 %.    HEENT: No thrush or ulcers. Resp: Lungs clear bilaterally. Cardio: Regular rate and rhythm. GI: Abdomen soft and nontender.  No hepatomegaly. Vascular: No leg edema. Neuro: Vibratory sense intact over the fingertips for tuning fork exam. Skin: Palms with hyperpigmentation.  No erythema or skin breakdown. Port-A-Cath without erythema.   Lab Results:  Lab Results  Component Value Date   WBC 4.4 07/17/2017   HGB 13.4 07/17/2017   HCT 39.0 07/17/2017   MCV 86.3 07/17/2017   PLT 135 (L) 07/17/2017   NEUTROABS 2.4 07/17/2017    Imaging:  No results found.  Medications: I have reviewed the patient's current medications.  Assessment/Plan: 1. Rectal cancer, clinical stage III ? Colonoscopy 05/24/2017, mass at 12 cm from the anal verge, biopsy confirmed invasive poorly differentiated adenocarcinoma ? CTs 06/06/2017-rectal mass, perirectal adenopathy, no evidence of metastatic disease, nonspecific 1 cm sclerotic lesion at the right iliac crest ? MRI 06/06/2017, [id number] measured at 5.3 cm from the anal sphincter ? Cycle 1 FOLFOX 06/20/2017 ? Cycle 2 FOLFOX 07/04/2017 ? Cycle 3  FOLFOX 07/17/2017  2. Diabetes 3. Glaucoma 4. Cecal polyp, tubular adenoma noted on the colonoscopy 05/24/2017 5. Port-A-Cath placement, Dr. Dema Severin, 06/17/2017    Disposition: Charles [name] appears stable.  He has completed 2 cycles of FOLFOX.  Plan to proceed with cycle 3 07/17/2017.  We reviewed the CBC from today.  He has mild pancytopenia.  He will contact the office with any bleeding.  He will return for lab, follow-up and cycle 4 FOLFOX in 2 weeks.  He will contact the office in the interim as outlined above or with any other problems.    [name] ANP/GNP-BC   07/17/2017  3:09 PM

## 2017-07-18 ENCOUNTER — Inpatient Hospital Stay: Payer: 59

## 2017-07-18 VITALS — BP 135/87 | HR 98 | Temp 97.9°F | Resp 18

## 2017-07-18 DIAGNOSIS — Z5111 Encounter for antineoplastic chemotherapy: Secondary | ICD-10-CM | POA: Diagnosis not present

## 2017-07-18 DIAGNOSIS — C20 Malignant neoplasm of rectum: Secondary | ICD-10-CM

## 2017-07-18 MED ORDER — LEUCOVORIN CALCIUM INJECTION 350 MG
400.0000 mg/m2 | Freq: Once | INTRAVENOUS | Status: AC
  Administered 2017-07-18: 888 mg via INTRAVENOUS
  Filled 2017-07-18: qty 44.4

## 2017-07-18 MED ORDER — DEXAMETHASONE SODIUM PHOSPHATE 10 MG/ML IJ SOLN
10.0000 mg | Freq: Once | INTRAMUSCULAR | Status: AC
  Administered 2017-07-18: 10 mg via INTRAVENOUS

## 2017-07-18 MED ORDER — DEXTROSE 5 % IV SOLN
Freq: Once | INTRAVENOUS | Status: AC
  Administered 2017-07-18: 08:00:00 via INTRAVENOUS

## 2017-07-18 MED ORDER — FLUOROURACIL CHEMO INJECTION 2.5 GM/50ML
400.0000 mg/m2 | Freq: Once | INTRAVENOUS | Status: AC
  Administered 2017-07-18: 900 mg via INTRAVENOUS
  Filled 2017-07-18: qty 18

## 2017-07-18 MED ORDER — FLUOROURACIL CHEMO INJECTION 5 GM/100ML
2400.0000 mg/m2 | INTRAVENOUS | Status: DC
  Administered 2017-07-18: 5350 mg via INTRAVENOUS
  Filled 2017-07-18: qty 107

## 2017-07-18 MED ORDER — PALONOSETRON HCL INJECTION 0.25 MG/5ML
0.2500 mg | Freq: Once | INTRAVENOUS | Status: AC
  Administered 2017-07-18: 0.25 mg via INTRAVENOUS

## 2017-07-18 MED ORDER — PALONOSETRON HCL INJECTION 0.25 MG/5ML
INTRAVENOUS | Status: AC
  Filled 2017-07-18: qty 5

## 2017-07-18 MED ORDER — OXALIPLATIN CHEMO INJECTION 100 MG/20ML
90.0000 mg/m2 | Freq: Once | INTRAVENOUS | Status: AC
  Administered 2017-07-18: 200 mg via INTRAVENOUS
  Filled 2017-07-18: qty 40

## 2017-07-18 MED ORDER — DEXAMETHASONE SODIUM PHOSPHATE 10 MG/ML IJ SOLN
INTRAMUSCULAR | Status: AC
  Filled 2017-07-18: qty 1

## 2017-07-18 NOTE — Patient Instructions (Signed)
[facility] Discharge Instructions for Patients Receiving Chemotherapy  Today you received the following chemotherapy agents: Leucovorin, Oxaliplatin, and 5FU (Fluorouracil).  To help prevent nausea and vomiting after your treatment, we encourage you to take your nausea medication as directed.   If you develop nausea and vomiting that is not controlled by your nausea medication, call the clinic.   BELOW ARE SYMPTOMS THAT SHOULD BE REPORTED IMMEDIATELY:  *FEVER GREATER THAN 100.5 F  *CHILLS WITH OR WITHOUT FEVER  NAUSEA AND VOMITING THAT IS NOT CONTROLLED WITH YOUR NAUSEA MEDICATION  *UNUSUAL SHORTNESS OF BREATH  *UNUSUAL BRUISING OR BLEEDING  TENDERNESS IN MOUTH AND THROAT WITH OR WITHOUT PRESENCE OF ULCERS  *URINARY PROBLEMS  *BOWEL PROBLEMS  UNUSUAL RASH Items with * indicate a potential emergency and should be followed up as soon as possible.  Feel free to call the clinic should you have any questions or concerns. The clinic phone number is [phone].  Please show the [facility] at check-in to the Emergency Department and triage nurse.   Oxaliplatin Injection What is this medicine? OXALIPLATIN (ox AL i PLA tin) is a chemotherapy drug. It targets fast dividing cells, like cancer cells, and causes these cells to die. This medicine is used to treat cancers of the colon and rectum, and many other cancers. This medicine may be used for other purposes; ask your health care provider or pharmacist if you have questions. COMMON BRAND NAME(S): Eloxatin What should I tell my health care provider before I take this medicine? They need to know if you have any of these conditions: -kidney disease -an unusual or allergic reaction to oxaliplatin, other chemotherapy, other medicines, foods, dyes, or preservatives -pregnant or trying to get pregnant -breast-feeding How should I use this medicine? This drug is given as an infusion into a vein. It is  administered in a hospital or clinic by a specially trained health care professional. Talk to your pediatrician regarding the use of this medicine in children. Special care may be needed. Overdosage: If you think you have taken too much of this medicine contact a poison control center or emergency room at once. NOTE: This medicine is only for you. Do not share this medicine with others. What if I miss a dose? It is important not to miss a dose. Call your doctor or health care professional if you are unable to keep an appointment. What may interact with this medicine? -medicines to increase blood counts like filgrastim, pegfilgrastim, sargramostim -probenecid -some antibiotics like amikacin, gentamicin, neomycin, polymyxin B, streptomycin, tobramycin -zalcitabine Talk to your doctor or health care professional before taking any of these medicines: -acetaminophen -aspirin -ibuprofen -ketoprofen -naproxen This list may not describe all possible interactions. Give your health care provider a list of all the medicines, herbs, non-prescription drugs, or dietary supplements you use. Also tell them if you smoke, drink alcohol, or use illegal drugs. Some items may interact with your medicine. What should I watch for while using this medicine? Your condition will be monitored carefully while you are receiving this medicine. You will need important blood work done while you are taking this medicine. This medicine can make you more sensitive to cold. Do not drink cold drinks or use ice. Cover exposed skin before coming in contact with cold temperatures or cold objects. When out in cold weather wear warm clothing and cover your mouth and nose to warm the air that goes into your lungs. Tell your doctor if you get sensitive to  the cold. This drug may make you feel generally unwell. This is not uncommon, as chemotherapy can affect healthy cells as well as cancer cells. Report any side effects. Continue your  course of treatment even though you feel ill unless your doctor tells you to stop. In some cases, you may be given additional medicines to help with side effects. Follow all directions for their use. Call your doctor or health care professional for advice if you get a fever, chills or sore throat, or other symptoms of a cold or flu. Do not treat yourself. This drug decreases your body's ability to fight infections. Try to avoid being around people who are sick. This medicine may increase your risk to bruise or bleed. Call your doctor or health care professional if you notice any unusual bleeding. Be careful brushing and flossing your teeth or using a toothpick because you may get an infection or bleed more easily. If you have any dental work done, tell your dentist you are receiving this medicine. Avoid taking products that contain aspirin, acetaminophen, ibuprofen, naproxen, or ketoprofen unless instructed by your doctor. These medicines may hide a fever. Do not become pregnant while taking this medicine. Women should inform their doctor if they wish to become pregnant or think they might be pregnant. There is a potential for serious side effects to an unborn child. Talk to your health care professional or pharmacist for more information. Do not breast-feed an infant while taking this medicine. Call your doctor or health care professional if you get diarrhea. Do not treat yourself. What side effects may I notice from receiving this medicine? Side effects that you should report to your doctor or health care professional as soon as possible: -allergic reactions like skin rash, itching or hives, swelling of the face, lips, or tongue -low blood counts - This drug may decrease the number of white blood cells, red blood cells and platelets. You may be at increased risk for infections and bleeding. -signs of infection - fever or chills, cough, sore throat, pain or difficulty passing urine -signs of decreased  platelets or bleeding - bruising, pinpoint red spots on the skin, black, tarry stools, nosebleeds -signs of decreased red blood cells - unusually weak or tired, fainting spells, lightheadedness -breathing problems -chest pain, pressure -cough -diarrhea -jaw tightness -mouth sores -nausea and vomiting -pain, swelling, redness or irritation at the injection site -pain, tingling, numbness in the hands or feet -problems with balance, talking, walking -redness, blistering, peeling or loosening of the skin, including inside the mouth -trouble passing urine or change in the amount of urine Side effects that usually do not require medical attention (report to your doctor or health care professional if they continue or are bothersome): -changes in vision -constipation -hair loss -loss of appetite -metallic taste in the mouth or changes in taste -stomach pain This list may not describe all possible side effects. Call your doctor for medical advice about side effects. You may report side effects to FDA at 1-800-FDA-1088. Where should I keep my medicine? This drug is given in a hospital or clinic and will not be stored at home. NOTE: This sheet is a summary. It may not cover all possible information. If you have questions about this medicine, talk to your doctor, pharmacist, or health care provider.  2018 Elsevier/Gold Standard (2007-07-22 17:22:47)   Leucovorin injection What is this medicine? LEUCOVORIN (loo koe VOR in) is used to prevent or treat the harmful effects of some medicines. This medicine is  used to treat anemia caused by a low amount of folic acid in the body. It is also used with 5-fluorouracil (5-FU) to treat colon cancer. This medicine may be used for other purposes; ask your health care provider or pharmacist if you have questions. What should I tell my health care provider before I take this medicine? They need to know if you have any of these conditions: -anemia from low  levels of vitamin B-12 in the blood -an unusual or allergic reaction to leucovorin, folic acid, other medicines, foods, dyes, or preservatives -pregnant or trying to get pregnant -breast-feeding How should I use this medicine? This medicine is for injection into a muscle or into a vein. It is given by a health care professional in a hospital or clinic setting. Talk to your pediatrician regarding the use of this medicine in children. Special care may be needed. Overdosage: If you think you have taken too much of this medicine contact a poison control center or emergency room at once. NOTE: This medicine is only for you. Do not share this medicine with others. What if I miss a dose? This does not apply. What may interact with this medicine? -capecitabine -fluorouracil -phenobarbital -phenytoin -primidone -trimethoprim-sulfamethoxazole This list may not describe all possible interactions. Give your health care provider a list of all the medicines, herbs, non-prescription drugs, or dietary supplements you use. Also tell them if you smoke, drink alcohol, or use illegal drugs. Some items may interact with your medicine. What should I watch for while using this medicine? Your condition will be monitored carefully while you are receiving this medicine. This medicine may increase the side effects of 5-fluorouracil, 5-FU. Tell your doctor or health care professional if you have diarrhea or mouth sores that do not get better or that get worse. What side effects may I notice from receiving this medicine? Side effects that you should report to your doctor or health care professional as soon as possible: -allergic reactions like skin rash, itching or hives, swelling of the face, lips, or tongue -breathing problems -fever, infection -mouth sores -unusual bleeding or bruising -unusually weak or tired Side effects that usually do not require medical attention (report to your doctor or health care  professional if they continue or are bothersome): -constipation or diarrhea -loss of appetite -nausea, vomiting This list may not describe all possible side effects. Call your doctor for medical advice about side effects. You may report side effects to FDA at 1-800-FDA-1088. Where should I keep my medicine? This drug is given in a hospital or clinic and will not be stored at home. NOTE: This sheet is a summary. It may not cover all possible information. If you have questions about this medicine, talk to your doctor, pharmacist, or health care provider.  2018 Elsevier/Gold Standard (2007-07-01 16:50:29)  Fluorouracil, 5-FU injection What is this medicine? FLUOROURACIL, 5-FU (flure oh YOOR a sil) is a chemotherapy drug. It slows the growth of cancer cells. This medicine is used to treat many types of cancer like breast cancer, colon or rectal cancer, pancreatic cancer, and stomach cancer. This medicine may be used for other purposes; ask your health care provider or pharmacist if you have questions. COMMON BRAND NAME(S): Adrucil What should I tell my health care provider before I take this medicine? They need to know if you have any of these conditions: -blood disorders -dihydropyrimidine dehydrogenase (DPD) deficiency -infection (especially a virus infection such as chickenpox, cold sores, or herpes) -kidney disease -liver disease -malnourished, poor  nutrition -recent or ongoing radiation therapy -an unusual or allergic reaction to fluorouracil, other chemotherapy, other medicines, foods, dyes, or preservatives -pregnant or trying to get pregnant -breast-feeding How should I use this medicine? This drug is given as an infusion or injection into a vein. It is administered in a hospital or clinic by a specially trained health care professional. Talk to your pediatrician regarding the use of this medicine in children. Special care may be needed. Overdosage: If you think you have taken too  much of this medicine contact a poison control center or emergency room at once. NOTE: This medicine is only for you. Do not share this medicine with others. What if I miss a dose? It is important not to miss your dose. Call your doctor or health care professional if you are unable to keep an appointment. What may interact with this medicine? -allopurinol -cimetidine -dapsone -digoxin -hydroxyurea -leucovorin -levamisole -medicines for seizures like ethotoin, fosphenytoin, phenytoin -medicines to increase blood counts like filgrastim, pegfilgrastim, sargramostim -medicines that treat or prevent blood clots like warfarin, enoxaparin, and dalteparin -methotrexate -metronidazole -pyrimethamine -some other chemotherapy drugs like busulfan, cisplatin, estramustine, vinblastine -trimethoprim -trimetrexate -vaccines Talk to your doctor or health care professional before taking any of these medicines: -acetaminophen -aspirin -ibuprofen -ketoprofen -naproxen This list may not describe all possible interactions. Give your health care provider a list of all the medicines, herbs, non-prescription drugs, or dietary supplements you use. Also tell them if you smoke, drink alcohol, or use illegal drugs. Some items may interact with your medicine. What should I watch for while using this medicine? Visit your doctor for checks on your progress. This drug may make you feel generally unwell. This is not uncommon, as chemotherapy can affect healthy cells as well as cancer cells. Report any side effects. Continue your course of treatment even though you feel ill unless your doctor tells you to stop. In some cases, you may be given additional medicines to help with side effects. Follow all directions for their use. Call your doctor or health care professional for advice if you get a fever, chills or sore throat, or other symptoms of a cold or flu. Do not treat yourself. This drug decreases your body's  ability to fight infections. Try to avoid being around people who are sick. This medicine may increase your risk to bruise or bleed. Call your doctor or health care professional if you notice any unusual bleeding. Be careful brushing and flossing your teeth or using a toothpick because you may get an infection or bleed more easily. If you have any dental work done, tell your dentist you are receiving this medicine. Avoid taking products that contain aspirin, acetaminophen, ibuprofen, naproxen, or ketoprofen unless instructed by your doctor. These medicines may hide a fever. Do not become pregnant while taking this medicine. Women should inform their doctor if they wish to become pregnant or think they might be pregnant. There is a potential for serious side effects to an unborn child. Talk to your health care professional or pharmacist for more information. Do not breast-feed an infant while taking this medicine. Men should inform their doctor if they wish to father a child. This medicine may lower sperm counts. Do not treat diarrhea with over the counter products. Contact your doctor if you have diarrhea that lasts more than 2 days or if it is severe and watery. This medicine can make you more sensitive to the sun. Keep out of the sun. If you cannot avoid being  in the sun, wear protective clothing and use sunscreen. Do not use sun lamps or tanning beds/booths. What side effects may I notice from receiving this medicine? Side effects that you should report to your doctor or health care professional as soon as possible: -allergic reactions like skin rash, itching or hives, swelling of the face, lips, or tongue -low blood counts - this medicine may decrease the number of white blood cells, red blood cells and platelets. You may be at increased risk for infections and bleeding. -signs of infection - fever or chills, cough, sore throat, pain or difficulty passing urine -signs of decreased platelets or  bleeding - bruising, pinpoint red spots on the skin, black, tarry stools, blood in the urine -signs of decreased red blood cells - unusually weak or tired, fainting spells, lightheadedness -breathing problems -changes in vision -chest pain -mouth sores -nausea and vomiting -pain, swelling, redness at site where injected -pain, tingling, numbness in the hands or feet -redness, swelling, or sores on hands or feet -stomach pain -unusual bleeding Side effects that usually do not require medical attention (report to your doctor or health care professional if they continue or are bothersome): -changes in finger or toe nails -diarrhea -dry or itchy skin -hair loss -headache -loss of appetite -sensitivity of eyes to the light -stomach upset -unusually teary eyes This list may not describe all possible side effects. Call your doctor for medical advice about side effects. You may report side effects to FDA at 1-800-FDA-1088. Where should I keep my medicine? This drug is given in a hospital or clinic and will not be stored at home. NOTE: This sheet is a summary. It may not cover all possible information. If you have questions about this medicine, talk to your doctor, pharmacist, or health care provider.  2018 Elsevier/Gold Standard (2007-04-30 13:53:16)

## 2017-07-19 ENCOUNTER — Other Ambulatory Visit: Payer: Self-pay | Admitting: *Deleted

## 2017-07-19 ENCOUNTER — Ambulatory Visit: Payer: 59

## 2017-07-20 ENCOUNTER — Telehealth: Payer: Self-pay | Admitting: *Deleted

## 2017-07-20 ENCOUNTER — Inpatient Hospital Stay: Payer: 59

## 2017-07-20 VITALS — BP 112/68 | HR 103 | Temp 97.6°F | Resp 18

## 2017-07-20 DIAGNOSIS — C20 Malignant neoplasm of rectum: Secondary | ICD-10-CM | POA: Diagnosis not present

## 2017-07-20 DIAGNOSIS — Z5111 Encounter for antineoplastic chemotherapy: Secondary | ICD-10-CM | POA: Diagnosis not present

## 2017-07-20 MED ORDER — FLUCONAZOLE 100 MG PO TABS: 100.0000 mg | ORAL_TABLET | Freq: Every day | ORAL | 0 refills | Status: DC

## 2017-07-20 MED ORDER — SODIUM CHLORIDE 0.9% FLUSH
10.0000 mL | INTRAVENOUS | Status: DC | PRN
  Administered 2017-07-20: 10 mL
  Filled 2017-07-20: qty 10

## 2017-07-20 MED ORDER — HEPARIN SOD (PORK) LOCK FLUSH 100 UNIT/ML IV SOLN
500.0000 [IU] | Freq: Once | INTRAVENOUS | Status: AC | PRN
  Administered 2017-07-20: 500 [IU]
  Filled 2017-07-20: qty 5

## 2017-07-20 NOTE — Telephone Encounter (Signed)
Upon assessment post 48 hour 5-FU infusion pt states onset of :  Increased cold sensitivity as described as increase in incidence that last longer before dissipating.  Above discussed including use of gloves ( has not been using ) for handling of cold products as well as to monitor  When he goes from hot/humid enviroment to air conditioning to cover mouth.  Noted thick white coating on tongue with several small white patches on inside gums.   Diflucan sent to pharmacy as well as discussed use of salt water rinses.  Episode of bleeding post removal of Onpro " kinda gushed out - concerned due to my platelets were lower at last check "  Site of onpro assessed with noted small bruising at site ( approximately 1.5 inches above elbow anteriorly ). Site   Presently dry and intact with no oozing or bleeding.  Discussed above can be normal - concern with lower plts is concern if bleeding occurs that does not stop with             applied pressure.  Pt and wife verbalized understanding - prescription for diflucan sent to pharmacy. This note will be sent to MD for review of concerns and recommendations- no further needs at this time.

## 2017-07-23 ENCOUNTER — Ambulatory Visit: Payer: 59 | Admitting: Physical Therapy

## 2017-07-25 ENCOUNTER — Encounter: Payer: Self-pay | Admitting: Physical Therapy

## 2017-07-25 ENCOUNTER — Ambulatory Visit: Payer: 59 | Admitting: Physical Therapy

## 2017-07-25 DIAGNOSIS — M6281 Muscle weakness (generalized): Secondary | ICD-10-CM | POA: Diagnosis not present

## 2017-07-25 DIAGNOSIS — R279 Unspecified lack of coordination: Secondary | ICD-10-CM

## 2017-07-25 NOTE — Patient Instructions (Signed)
Access Code: [email or web address]  URL: [email or web address]  Date: 07/25/2017  Prepared by: Lovett Calender   Exercises  Supine Single Knee to Chest Stretch - 5 reps - 1 sets - 10 sec hold - 2x daily - 7x weekly  Supine Piriformis Stretch - 3 reps - 1 sets - 30 sec hold - 1x daily - 7x weekly  Bent Knee Fallouts - 10 reps - 1 sets - 2x daily - 7x weekly  Hooklying Clamshell with Resistance - 10 reps - 1 sets - 3 sec hold - 2x daily - 7x weekly  Bridge - 10 reps - 1 sets - 3 sec hold - 2x daily - 7x weekly  Seated Piriformis Stretch with Trunk Bend - 3 reps - 1 sets - 30 sec hold - 2x daily - 7x weekly  Standing Shoulder Row with Anchored Resistance - 10 reps - 2 sets - 2x daily - 7x weekly  Shoulder Extension with Resistance - Neutral - 10 reps - 2 sets - 2x daily - 7x weekly  Walking Tandem Stance - 10 reps - 3 sets - 1x daily - 7x weekly  Alternating Single Leg Balance - Foot Behind - 10 reps - 3 sets - 1x daily - 7x weekly  Single Leg Balance with Opposite Leg Star Reach - 10 reps - 3 sets - 1x daily - 7x weekly  Supine Hamstring Stretch with Strap - 10 reps - 3 sets - 1x daily - 7x weekly  Supine ITB Stretch with Strap - 10 reps - 3 sets - 1x daily - 7x weekly  Sit to Stand with Pelvic Floor Contraction - 10 reps - 3 sets - 1x daily - 7x weekly  Swiss Ball Knee Extension - 10 reps - 3 sets - 1x daily - 7x weekly  Forward T - 10 reps - 3 sets - 1x daily - 7x weekly   [facility] [address] [place], [place] [address] Phone # [phone] Fax [phone]

## 2017-07-25 NOTE — Progress Notes (Signed)
FMLA successfully faxed to [employer] at [phone]. Mailed copy to patient address on file. 

## 2017-07-25 NOTE — Therapy (Signed)
[facility] [address], [place], [place], [address] Phone: [phone]   Fax:  [phone]  Physical Therapy Treatment  Patient Details  Name: Charles [name] MRN: [id number] Date of Birth: 11/12/1965 Referring Provider: Ladell Pier, MD   Encounter Date: 07/25/2017  PT End of Session - 07/25/17 1030    Visit Number  5    Date for PT Re-Evaluation  09/18/17    PT Start Time  1018    PT Stop Time  1059    PT Time Calculation (min)  41 min    Activity Tolerance  Patient tolerated treatment well    Behavior During Therapy  [facility] for tasks assessed/performed       Past Medical History:  Diagnosis Date  . Diabetes mellitus without complication ([place])    Type II  . Hyperlipidemia     Past Surgical History:  Procedure Laterality Date  . ACHILLES TENDON REPAIR Right 2005  . APPENDECTOMY    . HERNIA REPAIR     as a baby  . PORTACATH PLACEMENT N/A 06/17/2017   Procedure: RIGHT VS LEFT INTERNAL JUGULAR PORT-A-CATH PLACEMENT WITH ULTRASOUND;  Surgeon: Ileana Roup, MD;  Location: [facility];  Service: General;  Laterality: N/A;  FLURO    There were no vitals filed for this visit.  Subjective Assessment - 07/25/17 1021    Subjective  Pt states he is feeling very fatigued from chemo today.  THis weekend was not able to do anything.      Patient Stated Goals  know what to do for strengthening    Currently in Pain?  No/denies                       [facility] Adult PT Treatment/Exercise - 07/25/17 0001      Neuro Re-ed    Neuro Re-ed Details   circles and contract/relax on the ball      Lumbar Exercises: Aerobic   Nustep  L1 x 10 min; seat 11; UE 12 PT present to discuss progress      Lumbar Exercises: Standing   Other Standing Lumbar Exercises  bird dips - 10x each side 1 UE min support    Other Standing Lumbar Exercises  standing on rebounder with weight shift to single leg for balance - 3 ways  x 1 min      Lumbar Exercises: Seated   Long Arc Quad on [place]  Strengthening;Right;Left;20 reps    Hip Flexion on [place]  Strengthening;Both;20 reps    Other Seated Lumbar Exercises  seated on ball row and extension - green band - 20x      Lumbar Exercises: Supine   Straight Leg Raise  10 reps;Limitations    Straight Leg Raises Limitations  1.5 lb             PT Education - 07/25/17 1059    Education Details   Access Code: [email or web address]     Person(s) Educated  Patient    Methods  Explanation;Demonstration;Handout    Comprehension  Verbalized understanding;Returned demonstration       PT Short Term Goals - 07/09/17 0923      PT SHORT TERM GOAL #1   Title  ind with toileting techniques for healthy BM    Time  3    Period  Weeks    Status  Achieved      PT SHORT TERM GOAL #2   Title  ind with HEP  Baseline  initial HEP    Time  3    Period  Weeks    Status  Achieved        PT Long Term Goals - 06/26/17 1223      PT LONG TERM GOAL #1   Title  Able to coordinate pelvic floor for contract, relax, and bulge correctly and 3/5 MMT for pelvic floor contraction    Time  12    Period  Weeks    Status  New    Target Date  09/18/17      PT LONG TERM GOAL #2   Title  Pt will demonstrate 6MWT of 1300 feet or more for improved endurance with walking routine    Time  12    Period  Weeks    Status  New    Target Date  09/18/17      PT LONG TERM GOAL #3   Title  pt will demonstrate at least 20 mV contraction of pelvic floor and able to hold 10 sec for 5 reps    Time  12    Period  Weeks    Status  New    Target Date  09/18/17      PT LONG TERM GOAL #4   Title  pt will demonstrate 5 times sit to stand 10 seconds or less due to improved and maintained strength with exercise program    Time  12    Period  Weeks    Status  New    Target Date  09/18/17      PT LONG TERM GOAL #5   Title  Pt will report ability to have complete bowel movement without excessive bearing down  throughout cancer treatments due to good muscle strength and coordination    Time  12    Period  Weeks    Status  New    Target Date  09/18/17            Plan - 07/25/17 1102    Clinical Impression Statement  Pt did well during treatment today.  He demonstrates improved posture when sitting on the ball but slumps slightly when he gets fatigued.  he will benefit from skilled PT to ensure he is on track with well rounded HEP to do throughout treatments for cancer and ensure that he has best outcomes.    PT Treatment/Interventions  ADLs/Self Care Home Management;Biofeedback;Cryotherapy;Electrical Stimulation;Moist Heat;Gait training;Stair training;Functional mobility training;Therapeutic activities;Therapeutic exercise;Balance training;Neuromuscular re-education;Patient/family education;Manual techniques;Passive range of motion;Dry needling;Taping    PT Next Visit Plan  balance, trunk, LE ROM and stretches, add hip     PT Home Exercise Plan   Access Code: [email or web address]     Consulted and Agree with Plan of Care  Patient       Patient will benefit from skilled therapeutic intervention in order to improve the following deficits and impairments:  Decreased strength, Decreased coordination, Impaired tone, Decreased endurance, Postural dysfunction  Visit Diagnosis: Muscle weakness (generalized)  Unspecified lack of coordination     Problem List Patient Active Problem List   Diagnosis Date Noted  . Port-A-Cath in place 06/20/2017  . Rectal cancer ([place]) 06/12/2017  . Other and unspecified hyperlipidemia 06/19/2013  . Type II or unspecified type diabetes mellitus with neurological manifestations, not stated as uncontrolled(250.60) 06/19/2013    Zannie Cove, PT 07/25/2017, 3:00 PM  [facility] [address] [place], [place], [address] Phone: [phone]   Fax:  [phone]  Name: Charles [name] MRN: [id number] Date of  Birth: 15-Apr-1965

## 2017-07-26 ENCOUNTER — Other Ambulatory Visit: Payer: Self-pay | Admitting: Oncology

## 2017-07-29 ENCOUNTER — Ambulatory Visit: Payer: 59 | Admitting: Physical Therapy

## 2017-07-29 ENCOUNTER — Encounter: Payer: Self-pay | Admitting: Physical Therapy

## 2017-07-29 DIAGNOSIS — M6281 Muscle weakness (generalized): Secondary | ICD-10-CM

## 2017-07-29 DIAGNOSIS — R279 Unspecified lack of coordination: Secondary | ICD-10-CM

## 2017-07-29 NOTE — Therapy (Addendum)
[facility] [address] [place], [place], [address] Phone: [phone]   Fax:  [phone]  Physical Therapy Treatment  Patient Details  Name: Wildon Cuevas MRN: [id number] Date of Birth: 1965-10-13 Referring Provider: Ladell Pier, MD   Encounter Date: 07/29/2017  PT End of Session - 07/29/17 0936    Visit Number  6    Date for PT Re-Evaluation  09/18/17    PT Start Time  0931    PT Stop Time  1011    PT Time Calculation (min)  40 min    Activity Tolerance  Patient tolerated treatment well    Behavior During Therapy  [facility] for tasks assessed/performed       Past Medical History:  Diagnosis Date  . Diabetes mellitus without complication ([place])    Type II  . Hyperlipidemia     Past Surgical History:  Procedure Laterality Date  . ACHILLES TENDON REPAIR Right 2005  . APPENDECTOMY    . HERNIA REPAIR     as a baby  . PORTACATH PLACEMENT N/A 06/17/2017   Procedure: RIGHT VS LEFT INTERNAL JUGULAR PORT-A-CATH PLACEMENT WITH ULTRASOUND;  Surgeon: Ileana Roup, MD;  Location: [facility];  Service: General;  Laterality: N/A;  FLURO    There were no vitals filed for this visit.  Subjective Assessment - 07/29/17 1016    Subjective  Pt reports doing well.  Has a couple of questions on HEP, but feels good.    Currently in Pain?  No/denies         [facility] PT Assessment - 07/29/17 0001      6 minute walk test results    Aerobic Endurance Distance Walked  1580                   [facility] Adult PT Treatment/Exercise - 07/29/17 0001      Lumbar Exercises: Aerobic   Stationary Bike  L2 x 10 min      Lumbar Exercises: Standing   Other Standing Lumbar Exercises  bird dips - 10x each side 1 UE min support; tandem walking fwd/back - 2 laps    Other Standing Lumbar Exercises  standing star reaches - 10x each way      Lumbar Exercises: Supine   Clam  20 reps bilatera and single; knee drop out - red band     Other Supine Lumbar Exercises  roll outs and trunk rotation with blue ball; piriformis stretch blue ball             PT Education - 07/29/17 0950    Education Details  Access Code: [email or web address]     Person(s) Educated  Patient    Methods  Explanation;Demonstration;Handout;Verbal cues    Comprehension  Verbalized understanding;Returned demonstration       PT Short Term Goals - 07/09/17 0923      PT SHORT TERM GOAL #1   Title  ind with toileting techniques for healthy BM    Time  3    Period  Weeks    Status  Achieved      PT SHORT TERM GOAL #2   Title  ind with HEP    Baseline  initial HEP    Time  3    Period  Weeks    Status  Achieved        PT Long Term Goals - 07/29/17 4944      PT LONG TERM GOAL #1   Title  Able  to coordinate pelvic floor for contract, relax, and bulge correctly and 3/5 MMT for pelvic floor contraction    Baseline  no difficulty with bowel movement, able to feel contract and relax during exercises    Status  Partially Met      PT LONG TERM GOAL #2   Title  Pt will demonstrate 6MWT of 1300 feet or more for improved endurance with walking routine    Status  Achieved      PT LONG TERM GOAL #3   Title  pt will demonstrate at least 20 mV contraction of pelvic floor and able to hold 10 sec for 5 reps    Baseline  did not re-assess, no issues with contraction    Status  Not Met      PT LONG TERM GOAL #4   Title  pt will demonstrate 5 times sit to stand 10 seconds or less due to improved and maintained strength with exercise program    Baseline  improved strength not re-assessed at last visit    Status  Not Met      PT LONG TERM GOAL #5   Title  Pt will report ability to have complete bowel movement without excessive bearing down throughout cancer treatments due to good muscle strength and coordination    Status  Achieved            Plan - 07/29/17 0951    Clinical Impression Statement  Pt is doing well with his exercises and will cont  to work towards goals on his own at this time.  Pt is recommended to discharge with HEP    PT Treatment/Interventions  ADLs/Self Care Home Management;Biofeedback;Cryotherapy;Electrical Stimulation;Moist Heat;Gait training;Stair training;Functional mobility training;Therapeutic activities;Therapeutic exercise;Balance training;Neuromuscular re-education;Patient/family education;Manual techniques;Passive range of motion;Dry needling;Taping    PT Next Visit Plan  discharged today    PT Home Exercise Plan   Access Code: [email or web address]     Consulted and Agree with Plan of Care  Patient       Patient will benefit from skilled therapeutic intervention in order to improve the following deficits and impairments:  Decreased strength, Decreased coordination, Impaired tone, Decreased endurance, Postural dysfunction  Visit Diagnosis: Muscle weakness (generalized)  Unspecified lack of coordination     Problem List Patient Active Problem List   Diagnosis Date Noted  . Port-A-Cath in place 06/20/2017  . Rectal cancer ([place]) 06/12/2017  . Other and unspecified hyperlipidemia 06/19/2013  . Type II or unspecified type diabetes mellitus with neurological manifestations, not stated as uncontrolled(250.60) 06/19/2013    Zannie Cove, PT 07/29/2017, 10:16 AM  [facility] [address] [place], [place], [address] Phone: [phone]   Fax:  [phone]  Name: Maggie Dworkin MRN: [id number] Date of Birth: [birth year]  PHYSICAL THERAPY DISCHARGE SUMMARY  Visits from Start of Care: 6  Current functional level related to goals / functional outcomes: See above   Remaining deficits: See above   Education / Equipment: HEP  Plan: Patient agrees to discharge.  Patient goals were partially met. Patient is being discharged due to being pleased with the current functional level.  ?????   Google, PT 07/29/17 11:07 AM

## 2017-07-31 ENCOUNTER — Other Ambulatory Visit: Payer: Self-pay | Admitting: Emergency Medicine

## 2017-07-31 DIAGNOSIS — C20 Malignant neoplasm of rectum: Secondary | ICD-10-CM

## 2017-08-01 ENCOUNTER — Inpatient Hospital Stay: Payer: 59

## 2017-08-01 ENCOUNTER — Inpatient Hospital Stay: Payer: 59 | Admitting: Nutrition

## 2017-08-01 ENCOUNTER — Inpatient Hospital Stay (HOSPITAL_BASED_OUTPATIENT_CLINIC_OR_DEPARTMENT_OTHER): Payer: 59 | Admitting: Oncology

## 2017-08-01 ENCOUNTER — Telehealth: Payer: Self-pay | Admitting: Oncology

## 2017-08-01 VITALS — BP 141/70 | HR 111 | Temp 98.7°F | Resp 20 | Ht 68.0 in | Wt 221.0 lb

## 2017-08-01 DIAGNOSIS — E119 Type 2 diabetes mellitus without complications: Secondary | ICD-10-CM | POA: Diagnosis not present

## 2017-08-01 DIAGNOSIS — C20 Malignant neoplasm of rectum: Secondary | ICD-10-CM

## 2017-08-01 DIAGNOSIS — E876 Hypokalemia: Secondary | ICD-10-CM | POA: Diagnosis not present

## 2017-08-01 DIAGNOSIS — R110 Nausea: Secondary | ICD-10-CM | POA: Diagnosis not present

## 2017-08-01 DIAGNOSIS — Z5111 Encounter for antineoplastic chemotherapy: Secondary | ICD-10-CM | POA: Diagnosis not present

## 2017-08-01 DIAGNOSIS — Z95828 Presence of other vascular implants and grafts: Secondary | ICD-10-CM

## 2017-08-01 DIAGNOSIS — L819 Disorder of pigmentation, unspecified: Secondary | ICD-10-CM

## 2017-08-01 LAB — CBC WITH DIFFERENTIAL (CANCER CENTER ONLY)
BASOS ABS: 0.1 10*3/uL (ref 0.0–0.1)
Basophils Relative: 1 %
Eosinophils Absolute: 0.1 10*3/uL (ref 0.0–0.5)
Eosinophils Relative: 3 %
HEMATOCRIT: 41.3 % (ref 38.4–49.9)
HEMOGLOBIN: 14.1 g/dL (ref 13.0–17.1)
LYMPHS PCT: 27 %
Lymphs Abs: 1.3 10*3/uL (ref 0.9–3.3)
MCH: 30.1 pg (ref 27.2–33.4)
MCHC: 34.2 g/dL (ref 32.0–36.0)
MCV: 88 fL (ref 79.3–98.0)
MONO ABS: 0.5 10*3/uL (ref 0.1–0.9)
Monocytes Relative: 10 %
NEUTROS ABS: 2.8 10*3/uL (ref 1.5–6.5)
Neutrophils Relative %: 59 %
Platelet Count: 139 10*3/uL — ABNORMAL LOW (ref 140–400)
RBC: 4.69 MIL/uL (ref 4.20–5.82)
RDW: 15.9 % — AB (ref 11.0–14.6)
WBC Count: 4.8 10*3/uL (ref 4.0–10.3)

## 2017-08-01 LAB — CMP (CANCER CENTER ONLY)
ALBUMIN: 3.9 g/dL (ref 3.5–5.0)
ALT: 35 U/L (ref 0–44)
ANION GAP: 13 (ref 5–15)
AST: 16 U/L (ref 15–41)
Alkaline Phosphatase: 141 U/L — ABNORMAL HIGH (ref 38–126)
BUN: 16 mg/dL (ref 6–20)
CO2: 20 mmol/L — AB (ref 22–32)
Calcium: 9.5 mg/dL (ref 8.9–10.3)
Chloride: 107 mmol/L (ref 98–111)
Creatinine: 1.07 mg/dL (ref 0.61–1.24)
GFR, Est AFR Am: >60 mL/min (ref >60)
GFR, Estimated: >60 mL/min (ref >60)
GLUCOSE: 184 mg/dL — AB (ref 70–99)
Potassium: 3.2 mmol/L — ABNORMAL LOW (ref 3.5–5.1)
SODIUM: 140 mmol/L (ref 135–145)
Total Bilirubin: 0.5 mg/dL (ref 0.3–1.2)
Total Protein: 7.4 g/dL (ref 6.5–8.1)

## 2017-08-01 MED ORDER — FLUOROURACIL CHEMO INJECTION 2.5 GM/50ML
400.0000 mg/m2 | Freq: Once | INTRAVENOUS | Status: AC
  Administered 2017-08-01: 900 mg via INTRAVENOUS
  Filled 2017-08-01: qty 18

## 2017-08-01 MED ORDER — PALONOSETRON HCL INJECTION 0.25 MG/5ML
INTRAVENOUS | Status: AC
  Filled 2017-08-01: qty 5

## 2017-08-01 MED ORDER — SODIUM CHLORIDE 0.9 % IV SOLN
Freq: Once | INTRAVENOUS | Status: AC
  Administered 2017-08-01: 11:00:00 via INTRAVENOUS
  Filled 2017-08-01: qty 5

## 2017-08-01 MED ORDER — SODIUM CHLORIDE 0.9 % IV SOLN
2400.0000 mg/m2 | INTRAVENOUS | Status: DC
  Administered 2017-08-01: 5350 mg via INTRAVENOUS
  Filled 2017-08-01: qty 107

## 2017-08-01 MED ORDER — FLUCONAZOLE 100 MG PO TABS: 100.0000 mg | ORAL_TABLET | Freq: Every day | ORAL | 0 refills | Status: DC

## 2017-08-01 MED ORDER — OXALIPLATIN CHEMO INJECTION 100 MG/20ML
90.0000 mg/m2 | Freq: Once | INTRAVENOUS | Status: AC
  Administered 2017-08-01: 200 mg via INTRAVENOUS
  Filled 2017-08-01: qty 40

## 2017-08-01 MED ORDER — DEXTROSE 5 % IV SOLN
Freq: Once | INTRAVENOUS | Status: AC
  Administered 2017-08-01: 10:00:00 via INTRAVENOUS
  Filled 2017-08-01: qty 250

## 2017-08-01 MED ORDER — POTASSIUM CHLORIDE CRYS ER 20 MEQ PO TBCR: 20.0000 meq | EXTENDED_RELEASE_TABLET | Freq: Every day | ORAL | 3 refills | Status: DC

## 2017-08-01 MED ORDER — PALONOSETRON HCL INJECTION 0.25 MG/5ML
0.2500 mg | Freq: Once | INTRAVENOUS | Status: AC
  Administered 2017-08-01: 0.25 mg via INTRAVENOUS

## 2017-08-01 MED ORDER — SODIUM CHLORIDE 0.9% FLUSH
10.0000 mL | INTRAVENOUS | Status: DC | PRN
  Administered 2017-08-01: 10 mL
  Filled 2017-08-01: qty 10

## 2017-08-01 MED ORDER — LEUCOVORIN CALCIUM INJECTION 350 MG
400.0000 mg/m2 | Freq: Once | INTRAVENOUS | Status: AC
  Administered 2017-08-01: 888 mg via INTRAVENOUS
  Filled 2017-08-01: qty 44.4

## 2017-08-01 NOTE — Progress Notes (Signed)
Charles [name]   Diagnosis: Rectal cancer  INTERVAL HISTORY:    Charles [name] returns as scheduled.  He completed another cycle of FOLFOX 07/18/2017.  He reports cold sensitivity lasting for 1 week following chemotherapy.  No neuropathy symptoms at present.  He had increased malaise following this cycle of chemotherapy.  He has noted darkening of the skin over the hands and in his mouth.  He developed a "thrush "over the tongue, this improved with a course of Diflucan. He has approximately 3 bowel movements per day.  No bleeding.  No diarrhea.  He reports "cramping "in the abdomen beginning on the day of chemotherapy.  He reports nausea beginning several days following chemotherapy.  The nausea was relieved with Compazine.  No emesis.   Objective:  Vital signs in last 24 hours:  Blood pressure (!) 141/70, pulse (!) 111, temperature 98.7 F (37.1 C), temperature source Oral, resp. rate 20, height 5\' 8"  (1.727 m), weight 221 lb (100.2 kg), SpO2 98 %.    HEENT: Mild white coat over the tongue, no ulcers, no buccal thrush Resp: Lungs clear bilaterally Cardio: Regular rate and rhythm GI: No hepatosplenomegaly, no mass, nontender, soft Vascular: No leg edema Neuro: The vibratory sense is intact at the fingertips bilaterally Skin: Hyperpigmentation and dryness of the hands, no erythema or skin breakdown  Portacath/PICC-without erythema  Lab Results:  Lab Results  Component Value Date   WBC 4.8 08/01/2017   HGB 14.1 08/01/2017   HCT 41.3 08/01/2017   MCV 88.0 08/01/2017   PLT 139 (L) 08/01/2017   NEUTROABS 2.8 08/01/2017    CMP  Lab Results  Component Value Date   NA 140 08/01/2017   K 3.2 (L) 08/01/2017   CL 107 08/01/2017   CO2 20 (L) 08/01/2017   GLUCOSE 184 (H) 08/01/2017   BUN 16 08/01/2017   CREATININE 1.07 08/01/2017   CALCIUM 9.5 08/01/2017   PROT 7.4 08/01/2017   ALBUMIN 3.9 08/01/2017   AST 16 08/01/2017   ALT 35 08/01/2017   ALKPHOS 141 (H) 08/01/2017   BILITOT 0.5 08/01/2017   GFRNONAA >60 08/01/2017   GFRAA >60 08/01/2017     Medications: I have reviewed the patient's current medications.   Assessment/Plan: 1. Rectal cancer, clinical stage III ? Colonoscopy 05/24/2017, mass at 12 cm from the anal verge, biopsy confirmed invasive poorly differentiated adenocarcinoma ? CTs 06/06/2017-rectal mass, perirectal adenopathy, no evidence of metastatic disease, nonspecific 1 cm sclerotic lesion at the right iliac crest ? MRI 06/06/2017, T3cN1tumor measured at 5.3 cm from the anal sphincter ? Cycle 1 FOLFOX 06/20/2017 ? Cycle 2 FOLFOX 07/04/2017 ? Cycle 3 FOLFOX 07/17/2017 ? Cycle 4 FOLFOX 08/01/2017 (Emend added secondary to prolonged nausea following chemotherapy)  2. Diabetes 3. Glaucoma 4. Cecal polyp, tubular adenoma noted on the colonoscopy 05/24/2017 5. Port-A-Cath placement,Dr. White,06/17/2017   Disposition: Charles [name] has completed 3 cycles of FOLFOX.  He had prolonged nausea following the most recent cycle of chemotherapy.  Emend will be added to the antiemetic regimen today.  He will contact us for nausea following this cycle of chemotherapy.  He has hyperpigmentation secondary to 5-fluorouracil.  He understands this should improve at the completion of chemotherapy.  The potassium level is low today.  He will begin a potassium supplement.  Charles [name] will contact us for recurrent oral candidiasis.  He will return for an office visit and chemotherapy in 2 weeks.  25 minutes were spent with the patient today.  The  majority of the time was used for counseling and coordination of care.  Betsy Coder, MD  08/01/2017  9:42 AM

## 2017-08-01 NOTE — Telephone Encounter (Signed)
Scheduled appt per 7/25 los - gave patient AVS and calender per los.  

## 2017-08-01 NOTE — Progress Notes (Signed)
52 year old male diagnosed with rectal cancer.  He is a patient of Dr. Benay Spice.  Past medical history includes diabetes and hyperlipidemia.  Medications include Lipitor, Humalog, Victoza, and Compazine.  Labs include glucose 184 and potassium 3.2.  Height: 68 inches. Weight: 221 pounds. Usual body weight: 225 pounds. BMI: 33.6.  Patient struggles with occasional nausea and vomiting although he reports mostly dry heaves.  He is experiencing cold sensitivity from oxaliplatin. He denies diarrhea or constipation. He has taste alterations and describes himself as a picky eater. He understands that simple carbohydrates contribute to elevated blood sugars. Reports his physician would like him to lose weight prior to surgery. He is trying to increase activity.  Nutrition diagnosis:  Food and nutrition related knowledge deficit related to rectal cancer and associated treatments as evidenced by no prior need for nutrition related information.  Intervention: Educated patient on the importance of taking anti-emetic medicines to prevent nausea. Educated patient on appropriate food choices if he does have nausea. Encourage smaller more frequent meals and snacks with increased protein. Discouraged concentrated sweets and juices. Provided fact sheets.  Questions were answered.  Teach back method used.  Contact information given.  Monitoring, evaluation, goals: Patient will tolerate plant-based diet with adequate protein to minimize weight gain throughout treatment and promote slow safe weight loss.  Next visit: To be scheduled as needed.  **Disclaimer: This note was dictated with voice recognition software. Similar sounding words can inadvertently be transcribed and this note may contain transcription errors which may not have been corrected upon publication of note.**

## 2017-08-01 NOTE — Patient Instructions (Signed)
[facility] Discharge Instructions for Patients Receiving Chemotherapy  Today you received the following chemotherapy agents: Oxaliplatin (Eloxatin), Leucovorin, and Fluorouracil (Adrucil, 5-FU).  To help prevent nausea and vomiting after your treatment, we encourage you to take your nausea medication as prescribed. Received Aloxi during treatment today-->Take Compazine (not Zofran) for the next 3 days as needed.  If you develop nausea and vomiting that is not controlled by your nausea medication, call the clinic.   BELOW ARE SYMPTOMS THAT SHOULD BE REPORTED IMMEDIATELY:  *FEVER GREATER THAN 100.5 F  *CHILLS WITH OR WITHOUT FEVER  NAUSEA AND VOMITING THAT IS NOT CONTROLLED WITH YOUR NAUSEA MEDICATION  *UNUSUAL SHORTNESS OF BREATH  *UNUSUAL BRUISING OR BLEEDING  TENDERNESS IN MOUTH AND THROAT WITH OR WITHOUT PRESENCE OF ULCERS  *URINARY PROBLEMS  *BOWEL PROBLEMS  UNUSUAL RASH Items with * indicate a potential emergency and should be followed up as soon as possible.  Feel free to call the clinic should you have any questions or concerns. The clinic phone number is [phone].  Please show the CHEMO ALERT CARD at check-in to the Emergency Department and triage nurse.   

## 2017-08-03 ENCOUNTER — Inpatient Hospital Stay: Payer: 59

## 2017-08-03 VITALS — BP 136/74 | HR 103 | Temp 98.3°F | Resp 18

## 2017-08-03 DIAGNOSIS — Z95828 Presence of other vascular implants and grafts: Secondary | ICD-10-CM

## 2017-08-03 DIAGNOSIS — Z5111 Encounter for antineoplastic chemotherapy: Secondary | ICD-10-CM | POA: Diagnosis not present

## 2017-08-03 MED ORDER — HEPARIN SOD (PORK) LOCK FLUSH 100 UNIT/ML IV SOLN
500.0000 [IU] | Freq: Once | INTRAVENOUS | Status: AC | PRN
  Administered 2017-08-03: 500 [IU]
  Filled 2017-08-03: qty 5

## 2017-08-03 MED ORDER — SODIUM CHLORIDE 0.9% FLUSH
10.0000 mL | INTRAVENOUS | Status: DC | PRN
  Administered 2017-08-03: 10 mL
  Filled 2017-08-03: qty 10

## 2017-08-03 NOTE — Patient Instructions (Signed)

## 2017-08-10 ENCOUNTER — Other Ambulatory Visit: Payer: Self-pay | Admitting: Oncology

## 2017-08-14 ENCOUNTER — Encounter: Payer: Self-pay | Admitting: Genetic Counselor

## 2017-08-14 ENCOUNTER — Inpatient Hospital Stay: Payer: 59 | Attending: Oncology | Admitting: Genetic Counselor

## 2017-08-14 DIAGNOSIS — Z315 Encounter for genetic counseling: Secondary | ICD-10-CM

## 2017-08-14 DIAGNOSIS — C20 Malignant neoplasm of rectum: Secondary | ICD-10-CM

## 2017-08-14 DIAGNOSIS — Z8042 Family history of malignant neoplasm of prostate: Secondary | ICD-10-CM | POA: Diagnosis not present

## 2017-08-14 DIAGNOSIS — Z800 Family history of malignant neoplasm of digestive organs: Secondary | ICD-10-CM | POA: Diagnosis not present

## 2017-08-14 DIAGNOSIS — R000 Tachycardia, unspecified: Secondary | ICD-10-CM | POA: Insufficient documentation

## 2017-08-14 DIAGNOSIS — E119 Type 2 diabetes mellitus without complications: Secondary | ICD-10-CM | POA: Insufficient documentation

## 2017-08-14 DIAGNOSIS — B370 Candidal stomatitis: Secondary | ICD-10-CM | POA: Insufficient documentation

## 2017-08-14 DIAGNOSIS — Z807 Family history of other malignant neoplasms of lymphoid, hematopoietic and related tissues: Secondary | ICD-10-CM

## 2017-08-14 DIAGNOSIS — Z8049 Family history of malignant neoplasm of other genital organs: Secondary | ICD-10-CM | POA: Insufficient documentation

## 2017-08-14 DIAGNOSIS — Z5111 Encounter for antineoplastic chemotherapy: Secondary | ICD-10-CM | POA: Insufficient documentation

## 2017-08-14 NOTE — Progress Notes (Signed)
REFERRING PROVIDER: Ladell Pier, MD [address] [place], [place] [address]  PRIMARY PROVIDER:  Lujean Amel, MD  PRIMARY REASON FOR VISIT:  1. Rectal cancer ([place])   2. Family history of colon cancer   3. Family history of uterine cancer   4. Family history of stomach cancer   5. Family history of prostate cancer      HISTORY OF PRESENT ILLNESS:   Charles [name], a 52 y.o. male, was seen for a [employer] cancer genetics consultation at the request of Dr. Benay Spice due to a personal and family history of cancer.  Charles [name] presents to clinic today to discuss the possibility of a hereditary predisposition to cancer, genetic testing, and to further clarify his future cancer risks, as well as potential cancer risks for family members.   In June 2019, at the age of 68, Charles [name] was diagnosed with cancer of the rectum. This was treated with chemotherapy.  He will get radiation as well, before heading to surgery.   CANCER HISTORY:    Rectal cancer ([place])   06/12/2017 Initial Diagnosis    Rectal cancer ([place])      06/20/2017 -  Chemotherapy    The patient had palonosetron (ALOXI) injection 0.25 mg, 0.25 mg, Intravenous,  Once, 4 of 6 cycles Administration: 0.25 mg (06/20/2017), 0.25 mg (07/04/2017), 0.25 mg (07/18/2017), 0.25 mg (08/01/2017) leucovorin 888 mg in dextrose 5 % 250 mL infusion, 400 mg/m2 = 888 mg, Intravenous,  Once, 4 of 6 cycles Administration: 888 mg (06/20/2017), 888 mg (07/04/2017), 888 mg (07/18/2017), 888 mg (08/01/2017) oxaliplatin (ELOXATIN) 200 mg in dextrose 5 % 500 mL chemo infusion, 89 mg/m2 = 190 mg, Intravenous,  Once, 4 of 6 cycles Administration: 200 mg (06/20/2017), 200 mg (07/04/2017), 200 mg (07/18/2017), 200 mg (08/01/2017) fluorouracil (ADRUCIL) chemo injection 900 mg, 400 mg/m2 = 900 mg, Intravenous,  Once, 4 of 6 cycles Administration: 900 mg (06/20/2017), 900 mg (07/04/2017), 900 mg (07/18/2017), 900 mg (08/01/2017) fosaprepitant (EMEND) 150 mg,  dexamethasone (DECADRON) 12 mg in sodium chloride 0.9 % 145 mL IVPB, , Intravenous,  Once, 1 of 3 cycles Administration:  (08/01/2017) fluorouracil (ADRUCIL) 5,350 mg in sodium chloride 0.9 % 143 mL chemo infusion, 2,400 mg/m2 = 5,350 mg, Intravenous, 1 Day/Dose, 4 of 6 cycles Administration: 5,350 mg (06/20/2017), 5,350 mg (07/04/2017), 5,350 mg (07/18/2017), 5,350 mg (08/01/2017)  for chemotherapy treatment.         RISK FACTORS:  Prostate cancer screening: PSA normal, DRE normal Dermatology: No Colonoscopy: yes; abnormal. Any excessive radiation exposure in the past:  no  Past Medical History:  Diagnosis Date  . Diabetes mellitus without complication ([place])    Type II  . Family history of colon cancer   . Family history of prostate cancer   . Family history of stomach cancer   . Family history of uterine cancer   . Hyperlipidemia     Past Surgical History:  Procedure Laterality Date  . ACHILLES TENDON REPAIR Right 2005  . APPENDECTOMY    . HERNIA REPAIR     as a baby  . PORTACATH PLACEMENT N/A 06/17/2017   Procedure: RIGHT VS LEFT INTERNAL JUGULAR PORT-A-CATH PLACEMENT WITH ULTRASOUND;  Surgeon: Ileana Roup, MD;  Location: [facility];  Service: General;  Laterality: N/A;  FLURO    Social History   Socioeconomic History  . Marital status: Married    Spouse name: Not on file  . Number of children: Not on file  . Years of  education: Not on file  . Highest education level: Not on file  Occupational History  . Not on file  Social Needs  . Financial resource strain: Not on file  . Food insecurity:    Worry: Not on file    Inability: Not on file  . Transportation needs:    Medical: Not on file    Non-medical: Not on file  Tobacco Use  . Smoking status: Never Smoker  . Smokeless tobacco: Never Used  Substance and Sexual Activity  . Alcohol use: Yes    Comment: social  . Drug use: Never  . Sexual activity: Yes  Lifestyle  . Physical activity:    Days per week:  Not on file    Minutes per session: Not on file  . Stress: Not on file  Relationships  . Social connections:    Talks on phone: Not on file    Gets together: Not on file    Attends religious service: Not on file    Active member of club or organization: Not on file    Attends meetings of clubs or organizations: Not on file    Relationship status: Not on file  Other Topics Concern  . Not on file  Social History Narrative  . Not on file     FAMILY HISTORY:  We obtained a detailed, 4-generation family history.  Significant diagnoses are listed below: Family History  Problem Relation Age of Onset  . Hypertension Mother   . Hyperlipidemia Mother   . Non-Hodgkin's lymphoma Maternal Aunt 39  . Heart disease Maternal Grandmother   . Uterine cancer Maternal Grandmother        dx under 80  . Cirrhosis Maternal Grandfather   . Colon cancer Other        MGF sister  . Stomach cancer Other        MGMs brother  . Prostate cancer Other        MGMs brother  . Prostate cancer Cousin        mother's mat frist cousin    The patient has two daughters who are cancer free.  He is an only child.  His mother is alive, and he does not have any information on his biological father.  The patient's mother had a hysterectomy at 74 because of abnormal cells coming from fibroids in her uterus.  She has one sister who had Non-Hodgkin's Lymphoma at 18.  The maternal grandparents are deceased.  The grandfather died of liver cirrhosis.  His sister had colon cancer over age 64.  The grandmother died of heart disease, but had uterine cancer under age 81.  She had several siblings, a sister died of ovarian cancer, one brother had stomach cancer, and another brother had prostate cancer.  This brother had a son with prostate cancer.    Charles [name] is unaware of previous family history of genetic testing for hereditary cancer risks. Patient's maternal ancestors are of [place] American and [place] descent, and  paternal ancestors are of unknown descent. There is no reported Ashkenazi Jewish ancestry. There is no known consanguinity.  GENETIC COUNSELING ASSESSMENT: Charles [name] is a 52 y.o. male with a personal history of rectal cancer and family history of uterine, colon, stomach and prostate cancer which is somewhat suggestive of a Lynch syndrome and predisposition to cancer. We, therefore, discussed and recommended the following at today's visit.   DISCUSSION: We discussed that about 5-6% of colon cancer is hereditary with most cases due to MMR  genes associated with Lynch syndrome. There are other hereditary causes of colon cancer, that we would want to review as well.   We reviewed the characteristics, features and inheritance patterns of hereditary cancer syndromes. We also discussed genetic testing, including the appropriate family members to test, the process of testing, insurance coverage and turn-around-time for results. We discussed the implications of a negative, positive and/or variant of uncertain significant result. We recommended Charles [name] pursue genetic testing for the common hereditary cancer gene panel. The Hereditary Gene Panel offered by [facility] includes sequencing and/or deletion duplication testing of the following 47 genes: APC, ATM, AXIN2, BARD1, BMPR1A, BRCA1, BRCA2, BRIP1, CDH1, CDK4, CDKN2A (p14ARF), CDKN2A (p16INK4a), CHEK2, CTNNA1, DICER1, EPCAM (Deletion/duplication testing only), GREM1 (promoter region deletion/duplication testing only), KIT, MEN1, MLH1, MSH2, MSH3, MSH6, MUTYH, NBN, NF1, NHTL1, PALB2, PDGFRA, PMS2, POLD1, POLE, PTEN, RAD50, RAD51C, RAD51D, SDHB, SDHC, SDHD, SMAD4, SMARCA4. STK11, TP53, TSC1, TSC2, and VHL.  The following genes were evaluated for sequence changes only: SDHA and HOXB13 c.251G>A variant only.   Based on Charles [name] personal and family history of cancer, he meets medical criteria for genetic testing. Despite that he meets criteria, he may still  have an out of pocket cost. We discussed that if his out of pocket cost for testing is over $100, the laboratory will call and confirm whether he wants to proceed with testing.  If the out of pocket cost of testing is less than $100 he will be billed by the genetic testing laboratory.   In order to estimate his chance of having a MMR mutation, we used statistical models (PREMM1.2.6) and laboratory data that take into account his personal medical history, family history and ancestry.  Because each model is different, there can be a lot of variability in the risks they give.  Therefore, these numbers must be considered a rough range and not a precise risk of having a MMR mutation.  These models estimate that she has approximately a 5.0% chance of having a mutation. Based on this assessment of her family and personal history, genetic testing is recommended.   PLAN: After considering the risks, benefits, and limitations, Charles [name]  provided informed consent to pursue genetic testing and the blood sample was sent to [facility] for analysis of the Common Hereditary Cancer panel. Results should be available within approximately 2-3 weeks' time, at which point they will be disclosed by telephone to Charles [name], as will any additional recommendations warranted by these results. Charles [name] will receive a summary of his genetic counseling visit and a copy of his results once available. This information will also be available in Epic. We encouraged Charles [name] to remain in contact with cancer genetics annually so that we can continuously update the family history and inform him of any changes in cancer genetics and testing that may be of benefit for his family. Charles [name] questions were answered to his satisfaction today. Our contact information was provided should additional questions or concerns arise.  Lastly, we encouraged Charles [name] to remain in contact with cancer genetics annually so that we  can continuously update the family history and inform him of any changes in cancer genetics and testing that may be of benefit for this family.   Mr.  [name] questions were answered to his satisfaction today. Our contact information was provided should additional questions or concerns arise. Thank you for the referral and allowing [place] to share in the care of your patient.   Vaiden Adames P. Florene Glen,  MS, [facility] Certified Genetic Counselor [email or web address] phone: [phone]  The patient was seen for a total of 30 minutes in face-to-face genetic counseling.  This patient was discussed with Drs. Magrinat, Lindi Adie and/or Burr Medico who agrees with the above.    _______________________________________________________________________ For Office Staff:  Number of people involved in session: 1 Was an Intern/ student involved with case: no

## 2017-08-15 ENCOUNTER — Inpatient Hospital Stay: Payer: 59

## 2017-08-15 ENCOUNTER — Inpatient Hospital Stay (HOSPITAL_BASED_OUTPATIENT_CLINIC_OR_DEPARTMENT_OTHER): Payer: 59 | Admitting: Nurse Practitioner

## 2017-08-15 ENCOUNTER — Encounter: Payer: Self-pay | Admitting: Nurse Practitioner

## 2017-08-15 ENCOUNTER — Telehealth: Payer: Self-pay | Admitting: Nurse Practitioner

## 2017-08-15 VITALS — BP 149/81 | HR 112 | Temp 98.5°F | Resp 18 | Ht 68.0 in | Wt 220.4 lb

## 2017-08-15 DIAGNOSIS — E119 Type 2 diabetes mellitus without complications: Secondary | ICD-10-CM | POA: Diagnosis not present

## 2017-08-15 DIAGNOSIS — C20 Malignant neoplasm of rectum: Secondary | ICD-10-CM

## 2017-08-15 DIAGNOSIS — Z8042 Family history of malignant neoplasm of prostate: Secondary | ICD-10-CM | POA: Diagnosis not present

## 2017-08-15 DIAGNOSIS — B370 Candidal stomatitis: Secondary | ICD-10-CM | POA: Diagnosis not present

## 2017-08-15 DIAGNOSIS — R000 Tachycardia, unspecified: Secondary | ICD-10-CM | POA: Diagnosis not present

## 2017-08-15 DIAGNOSIS — Z5111 Encounter for antineoplastic chemotherapy: Secondary | ICD-10-CM | POA: Diagnosis present

## 2017-08-15 DIAGNOSIS — Z95828 Presence of other vascular implants and grafts: Secondary | ICD-10-CM

## 2017-08-15 DIAGNOSIS — Z8041 Family history of malignant neoplasm of ovary: Secondary | ICD-10-CM | POA: Diagnosis not present

## 2017-08-15 LAB — CBC WITH DIFFERENTIAL (CANCER CENTER ONLY)
BASOS ABS: 0.1 10*3/uL (ref 0.0–0.1)
BASOS PCT: 1 %
EOS ABS: 0.1 10*3/uL (ref 0.0–0.5)
Eosinophils Relative: 3 %
HCT: 39.7 % (ref 38.4–49.9)
Hemoglobin: 13.6 g/dL (ref 13.0–17.1)
Lymphocytes Relative: 28 %
Lymphs Abs: 1.2 10*3/uL (ref 0.9–3.3)
MCH: 30.2 pg (ref 27.2–33.4)
MCHC: 34.2 g/dL (ref 32.0–36.0)
MCV: 88.4 fL (ref 79.3–98.0)
Monocytes Absolute: 0.4 10*3/uL (ref 0.1–0.9)
Monocytes Relative: 9 %
Neutro Abs: 2.6 10*3/uL (ref 1.5–6.5)
Neutrophils Relative %: 59 %
Platelet Count: 131 10*3/uL — ABNORMAL LOW (ref 140–400)
RBC: 4.49 MIL/uL (ref 4.20–5.82)
RDW: 16.4 % — ABNORMAL HIGH (ref 11.0–14.6)
WBC: 4.4 10*3/uL (ref 4.0–10.3)

## 2017-08-15 LAB — CMP (CANCER CENTER ONLY)
ALK PHOS: 134 U/L — AB (ref 38–126)
ALT: 34 U/L (ref 0–44)
AST: 18 U/L (ref 15–41)
Albumin: 3.8 g/dL (ref 3.5–5.0)
Anion gap: 13 (ref 5–15)
BUN: 13 mg/dL (ref 6–20)
CALCIUM: 9.2 mg/dL (ref 8.9–10.3)
CO2: 21 mmol/L — ABNORMAL LOW (ref 22–32)
Chloride: 105 mmol/L (ref 98–111)
Creatinine: 1 mg/dL (ref 0.61–1.24)
GFR, Est AFR Am: >60 mL/min (ref >60)
Glucose, Bld: 118 mg/dL — ABNORMAL HIGH (ref 70–99)
Potassium: 3.7 mmol/L (ref 3.5–5.1)
Sodium: 139 mmol/L (ref 135–145)
TOTAL PROTEIN: 7.2 g/dL (ref 6.5–8.1)
Total Bilirubin: 0.7 mg/dL (ref 0.3–1.2)

## 2017-08-15 MED ORDER — SODIUM CHLORIDE 0.9 % IV SOLN
Freq: Once | INTRAVENOUS | Status: AC
  Administered 2017-08-15: 12:00:00 via INTRAVENOUS
  Filled 2017-08-15: qty 5

## 2017-08-15 MED ORDER — DEXTROSE 5 % IV SOLN
INTRAVENOUS | Status: DC
  Administered 2017-08-15: 11:00:00 via INTRAVENOUS
  Filled 2017-08-15: qty 250

## 2017-08-15 MED ORDER — PALONOSETRON HCL INJECTION 0.25 MG/5ML
0.2500 mg | Freq: Once | INTRAVENOUS | Status: AC
  Administered 2017-08-15: 0.25 mg via INTRAVENOUS

## 2017-08-15 MED ORDER — LEUCOVORIN CALCIUM INJECTION 350 MG
400.0000 mg/m2 | Freq: Once | INTRAVENOUS | Status: AC
  Administered 2017-08-15: 888 mg via INTRAVENOUS
  Filled 2017-08-15: qty 44.4

## 2017-08-15 MED ORDER — FLUCONAZOLE 100 MG PO TABS: 100.0000 mg | ORAL_TABLET | Freq: Every day | ORAL | 1 refills | Status: DC

## 2017-08-15 MED ORDER — PALONOSETRON HCL INJECTION 0.25 MG/5ML
INTRAVENOUS | Status: AC
  Filled 2017-08-15: qty 5

## 2017-08-15 MED ORDER — SODIUM CHLORIDE 0.9% FLUSH
10.0000 mL | INTRAVENOUS | Status: DC | PRN
  Filled 2017-08-15: qty 10

## 2017-08-15 MED ORDER — DEXTROSE 5 % IV SOLN
Freq: Once | INTRAVENOUS | Status: AC
  Administered 2017-08-15: 11:00:00 via INTRAVENOUS
  Filled 2017-08-15: qty 250

## 2017-08-15 MED ORDER — FLUOROURACIL CHEMO INJECTION 2.5 GM/50ML
400.0000 mg/m2 | Freq: Once | INTRAVENOUS | Status: AC
  Administered 2017-08-15: 900 mg via INTRAVENOUS
  Filled 2017-08-15: qty 18

## 2017-08-15 MED ORDER — OXALIPLATIN CHEMO INJECTION 100 MG/20ML
90.0000 mg/m2 | Freq: Once | INTRAVENOUS | Status: AC
  Administered 2017-08-15: 200 mg via INTRAVENOUS
  Filled 2017-08-15: qty 40

## 2017-08-15 MED ORDER — HEPARIN SOD (PORK) LOCK FLUSH 100 UNIT/ML IV SOLN
500.0000 [IU] | Freq: Once | INTRAVENOUS | Status: DC | PRN
  Filled 2017-08-15: qty 5

## 2017-08-15 MED ORDER — SODIUM CHLORIDE 0.9% FLUSH
10.0000 mL | INTRAVENOUS | Status: DC | PRN
  Administered 2017-08-15: 10 mL
  Filled 2017-08-15: qty 10

## 2017-08-15 MED ORDER — SODIUM CHLORIDE 0.9 % IV SOLN
2400.0000 mg/m2 | INTRAVENOUS | Status: DC
  Administered 2017-08-15: 5350 mg via INTRAVENOUS
  Filled 2017-08-15: qty 107

## 2017-08-15 NOTE — Patient Instructions (Signed)
[facility] Discharge Instructions for Patients Receiving Chemotherapy  Today you received the following chemotherapy agents: Oxaliplatin (Eloxatin), Leucovorin, and Fluorouracil (Adrucil, 5-FU).  To help prevent nausea and vomiting after your treatment, we encourage you to take your nausea medication as prescribed. Received Aloxi during treatment today-->Take Compazine (not Zofran) for the next 3 days as needed.  If you develop nausea and vomiting that is not controlled by your nausea medication, call the clinic.   BELOW ARE SYMPTOMS THAT SHOULD BE REPORTED IMMEDIATELY:  *FEVER GREATER THAN 100.5 F  *CHILLS WITH OR WITHOUT FEVER  NAUSEA AND VOMITING THAT IS NOT CONTROLLED WITH YOUR NAUSEA MEDICATION  *UNUSUAL SHORTNESS OF BREATH  *UNUSUAL BRUISING OR BLEEDING  TENDERNESS IN MOUTH AND THROAT WITH OR WITHOUT PRESENCE OF ULCERS  *URINARY PROBLEMS  *BOWEL PROBLEMS  UNUSUAL RASH Items with * indicate a potential emergency and should be followed up as soon as possible.  Feel free to call the clinic should you have any questions or concerns. The clinic phone number is [phone].  Please show the CHEMO ALERT CARD at check-in to the Emergency Department and triage nurse.   

## 2017-08-15 NOTE — Progress Notes (Signed)
  Charles [name] OFFICE PROGRESS NOTE   Diagnosis: Rectal cancer  INTERVAL HISTORY:   Charles [name] returns as scheduled.  He completed cycle 4 FOLFOX 07/31/2017.  He had nausea beginning day 3.  This lasted for about 3 days and was controlled with Compazine.  He developed thrush and had a sore on his lip.  No diarrhea.  Cold sensitivity lasted 7 or 8 days.  No persistent neuropathy symptoms.  He noted prolonged fatigue.  No bleeding.  He notes a dry patch on the sole of the right foot.  Objective:  Vital signs in last 24 hours:  Blood pressure (!) 149/81, pulse (!) 112, temperature 98.5 F (36.9 C), temperature source Oral, resp. rate 18, height 5\' 8"  (1.727 m), weight 220 lb 6.4 oz (100 kg), SpO2 100 %.    HEENT: White coating over tongue.  No ulcers. Resp: Lungs clear bilaterally. Cardio: Regular rate and rhythm. GI: Abdomen soft and nontender.  No hepatomegaly. Vascular: No leg edema. Neuro: Vibratory sense intact over the fingertips per tuning fork exam. Skin: Palms and soles with hyperpigmentation.  Heels are dry appearing.  Callus formation right lateral sole. Port-A-Cath without erythema.   Lab Results:  Lab Results  Component Value Date   WBC 4.4 08/15/2017   HGB 13.6 08/15/2017   HCT 39.7 08/15/2017   MCV 88.4 08/15/2017   PLT 131 (L) 08/15/2017   NEUTROABS 2.6 08/15/2017    Imaging:  No results found.  Medications: I have reviewed the patient's current medications.  Assessment/Plan: 1. Rectal cancer, clinical stage III ? Colonoscopy 05/24/2017, mass at 12 cm from the anal verge, biopsy confirmed invasive poorly differentiated adenocarcinoma ? CTs 06/06/2017-rectal mass, perirectal adenopathy, no evidence of metastatic disease, nonspecific 1 cm sclerotic lesion at the right iliac crest ? MRI 06/06/2017, T3cN1tumor measured at 5.3 cm from the anal sphincter ? Cycle 1 FOLFOX 06/20/2017 ? Cycle 2 FOLFOX 07/04/2017 ? Cycle 3 FOLFOX 07/17/2017 ? Cycle 4  FOLFOX 08/01/2017 (Emend added secondary to prolonged nausea following chemotherapy) ? Cycle 5 FOLFOX 08/15/2017  2. Diabetes 3. Glaucoma 4. Cecal polyp, tubular adenoma noted on the colonoscopy 05/24/2017 5. Port-A-Cath placement,Dr. White,06/17/2017  Disposition: [name] appears stable.  He has completed 4 cycles of FOLFOX.  Plan to proceed with cycle 5 today as scheduled.  We reviewed the CBC from today.  Counts are adequate for treatment.  A prescription was sent to his pharmacy for Diflucan to treat oral candidiasis.  We discussed prophylactic steroids beginning on the day of pump discontinuation but ultimately decided against this due to diabetes.  He will try taking Compazine on a scheduled basis for 2 days beginning day 3.  He will return for lab, follow-up and cycle 6 FOLFOX in 2 weeks.  He will contact the office in the interim with any problems.    [name] ANP/GNP-BC   08/15/2017  10:50 AM

## 2017-08-15 NOTE — Telephone Encounter (Signed)
Scheduled appt per 8/8 los - gave patient AVS and calender

## 2017-08-17 ENCOUNTER — Inpatient Hospital Stay: Payer: 59

## 2017-08-17 VITALS — BP 130/82 | HR 103 | Temp 98.1°F | Resp 18

## 2017-08-17 DIAGNOSIS — C20 Malignant neoplasm of rectum: Secondary | ICD-10-CM

## 2017-08-17 DIAGNOSIS — Z5111 Encounter for antineoplastic chemotherapy: Secondary | ICD-10-CM | POA: Diagnosis not present

## 2017-08-17 MED ORDER — SODIUM CHLORIDE 0.9% FLUSH
10.0000 mL | INTRAVENOUS | Status: DC | PRN
  Administered 2017-08-17: 10 mL
  Filled 2017-08-17: qty 10

## 2017-08-17 MED ORDER — HEPARIN SOD (PORK) LOCK FLUSH 100 UNIT/ML IV SOLN
500.0000 [IU] | Freq: Once | INTRAVENOUS | Status: AC | PRN
  Administered 2017-08-17: 500 [IU]
  Filled 2017-08-17: qty 5

## 2017-08-20 DIAGNOSIS — C20 Malignant neoplasm of rectum: Secondary | ICD-10-CM | POA: Diagnosis not present

## 2017-08-26 ENCOUNTER — Telehealth: Payer: Self-pay | Admitting: Genetic Counselor

## 2017-08-26 DIAGNOSIS — E1165 Type 2 diabetes mellitus with hyperglycemia: Secondary | ICD-10-CM | POA: Diagnosis not present

## 2017-08-26 DIAGNOSIS — E1142 Type 2 diabetes mellitus with diabetic polyneuropathy: Secondary | ICD-10-CM | POA: Diagnosis not present

## 2017-08-26 DIAGNOSIS — Z794 Long term (current) use of insulin: Secondary | ICD-10-CM | POA: Diagnosis not present

## 2017-08-26 NOTE — Telephone Encounter (Signed)
Revealed negative genetic testing.  Discussed that we do not know why he has colon cancer or why there is cancer in the family. It could be due to a different gene that we are not testing, or maybe our current technology may not be able to pick something up.  It will be important for him to keep in contact with genetics to keep up with whether additional testing may be needed.  Two VUS were identified but we do not change medical management based on these.

## 2017-08-27 ENCOUNTER — Ambulatory Visit: Payer: Self-pay | Admitting: Genetic Counselor

## 2017-08-27 ENCOUNTER — Encounter: Payer: Self-pay | Admitting: Genetic Counselor

## 2017-08-27 DIAGNOSIS — Z1379 Encounter for other screening for genetic and chromosomal anomalies: Secondary | ICD-10-CM | POA: Insufficient documentation

## 2017-08-27 NOTE — Progress Notes (Addendum)
HPI:  Charles [name] was previously seen in the [facility] due to a personal and family history of cancer and concerns regarding a hereditary predisposition to cancer. Please refer to our prior cancer genetics clinic note for more information regarding Charles [name] medical, social and family histories, and our assessment and recommendations, at the time. Charles [name] recent genetic test results were disclosed to him, as were recommendations warranted by these results. These results and recommendations are discussed in more detail below.  CANCER HISTORY:    Rectal cancer ([place])   06/12/2017 Initial Diagnosis    Rectal cancer ([place])    06/20/2017 -  Chemotherapy    The patient had palonosetron (ALOXI) injection 0.25 mg, 0.25 mg, Intravenous,  Once, 6 of 6 cycles Administration: 0.25 mg (06/20/2017), 0.25 mg (07/04/2017), 0.25 mg (07/18/2017), 0.25 mg (08/01/2017), 0.25 mg (08/15/2017) leucovorin 888 mg in dextrose 5 % 250 mL infusion, 400 mg/m2 = 888 mg, Intravenous,  Once, 6 of 6 cycles Administration: 888 mg (06/20/2017), 888 mg (07/04/2017), 888 mg (07/18/2017), 888 mg (08/01/2017), 888 mg (08/15/2017) oxaliplatin (ELOXATIN) 200 mg in dextrose 5 % 500 mL chemo infusion, 89 mg/m2 = 190 mg, Intravenous,  Once, 6 of 6 cycles Administration: 200 mg (06/20/2017), 200 mg (07/04/2017), 200 mg (07/18/2017), 200 mg (08/01/2017), 200 mg (08/15/2017) fluorouracil (ADRUCIL) chemo injection 900 mg, 400 mg/m2 = 900 mg, Intravenous,  Once, 6 of 6 cycles Administration: 900 mg (06/20/2017), 900 mg (07/04/2017), 900 mg (07/18/2017), 900 mg (08/01/2017), 900 mg (08/15/2017) fosaprepitant (EMEND) 150 mg, dexamethasone (DECADRON) 12 mg in sodium chloride 0.9 % 145 mL IVPB, , Intravenous,  Once, 3 of 3 cycles Administration:  (08/01/2017),  (08/15/2017) fluorouracil (ADRUCIL) 5,350 mg in sodium chloride 0.9 % 143 mL chemo infusion, 2,400 mg/m2 = 5,350 mg, Intravenous, 1 Day/Dose, 6 of 6 cycles Administration: 5,350 mg  (06/20/2017), 5,350 mg (07/04/2017), 5,350 mg (07/18/2017), 5,350 mg (08/01/2017), 5,350 mg (08/15/2017)  for chemotherapy treatment.     08/23/2017 Genetic Testing    ALK c.2229C>T (Silent) and CDC73 c.416G>A (Arg139Gln) VUS identified on the Multicancer panel.  The Multi-Gene Panel offered by [facility] includes sequencing and/or deletion duplication testing of the following 84 genes: AIP, ALK, APC, ATM, AXIN2,BAP1,  BARD1, BLM, BMPR1A, BRCA1, BRCA2, BRIP1, CASR, CDC73, CDH1, CDK4, CDKN1B, CDKN1C, CDKN2A (p14ARF), CDKN2A (p16INK4a), CEBPA, CHEK2, CTNNA1, DICER1, DIS3L2, EGFR (c.2369C>T, p.Thr790Met variant only), EPCAM (Deletion/duplication testing only), FH, FLCN, GATA2, GPC3, GREM1 (Promoter region deletion/duplication testing only), HOXB13 (c.251G>A, p.Gly84Glu), HRAS, KIT, MAX, MEN1, MET, MITF (c.952G>A, p.Glu318Lys variant only), MLH1, MSH2, MSH3, MSH6, MUTYH, NBN, NF1, NF2, NTHL1, PALB2, PDGFRA, PHOX2B, PMS2, POLD1, POLE, POT1, PRKAR1A, PTCH1, PTEN, RAD50, RAD51C, RAD51D, RB1, RECQL4, RET, RUNX1, SDHAF2, SDHA (sequence changes only), SDHB, SDHC, SDHD, SMAD4, SMARCA4, SMARCB1, SMARCE1, STK11, SUFU, TERC, TERT, TMEM127, TP53, TSC1, TSC2, VHL, WRN and WT1.  The report date is August 23, 2017.      FAMILY HISTORY:  We obtained a detailed, 4-generation family history.  Significant diagnoses are listed below: Family History  Problem Relation Age of Onset   Hypertension Mother    Hyperlipidemia Mother    Non-Hodgkin's lymphoma Maternal Aunt 35   Heart disease Maternal Grandmother    Uterine cancer Maternal Grandmother        dx under 23   Cirrhosis Maternal Grandfather    Colon cancer Other        MGF sister   Stomach cancer Other        MGMs brother   Prostate  cancer Other        MGMs brother   Prostate cancer Cousin        mother's mat frist cousin    The patient has two daughters who are cancer free.  He is an only child.  His mother is alive, and he does not have any information on his  biological father.   The patient's mother had a hysterectomy at 35 because of abnormal cells coming from fibroids in her uterus.  She has one sister who had Non-Hodgkin's Lymphoma at 70.  The maternal grandparents are deceased.  The grandfather died of liver cirrhosis.  His sister had colon cancer over age 68.  The grandmother died of heart disease, but had uterine cancer under age 47.  She had several siblings, a sister died of ovarian cancer, one brother had stomach cancer, and another brother had prostate cancer.  This brother had a son with prostate cancer.     Charles [name] is unaware of previous family history of genetic testing for hereditary cancer risks. Patient's maternal ancestors are of [place] and [place] descent, and paternal ancestors are of unknown descent. There is no reported Ashkenazi Jewish ancestry. There is no known consanguinity.  GENETIC TEST RESULTS: Genetic testing reported out on August 23, 2017 through the Multi-cancer panel found no deleterious mutations. The Multi-Gene Panel offered by [facility] includes sequencing and/or deletion duplication testing of the following 84 genes: AIP, ALK, APC, ATM, AXIN2,BAP1,  BARD1, BLM, BMPR1A, BRCA1, BRCA2, BRIP1, CASR, CDC73, CDH1, CDK4, CDKN1B, CDKN1C, CDKN2A (p14ARF), CDKN2A (p16INK4a), CEBPA, CHEK2, CTNNA1, DICER1, DIS3L2, EGFR (c.2369C>T, p.Thr790Met variant only), EPCAM (Deletion/duplication testing only), FH, FLCN, GATA2, GPC3, GREM1 (Promoter region deletion/duplication testing only), HOXB13 (c.251G>A, p.Gly84Glu), HRAS, KIT, MAX, MEN1, MET, MITF (c.952G>A, p.Glu318Lys variant only), MLH1, MSH2, MSH3, MSH6, MUTYH, NBN, NF1, NF2, NTHL1, PALB2, PDGFRA, PHOX2B, PMS2, POLD1, POLE, POT1, PRKAR1A, PTCH1, PTEN, RAD50, RAD51C, RAD51D, RB1, RECQL4, RET, RUNX1, SDHAF2, SDHA (sequence changes only), SDHB, SDHC, SDHD, SMAD4, SMARCA4, SMARCB1, SMARCE1, STK11, SUFU, TERC, TERT, TMEM127, TP53, TSC1, TSC2, VHL, WRN and WT1.  The test report has  been scanned into EPIC and is located under the Molecular Pathology section of the Results Review tab.    We discussed with Charles [name] that since the current genetic testing is not perfect, it is possible there may be a gene mutation in one of these genes that current testing cannot detect, but that chance is small.  We also discussed, that it is possible that another gene that has not yet been discovered, or that we have not yet tested, is responsible for the cancer diagnoses in the family, and it is, therefore, important to remain in touch with cancer genetics in the future so that we can continue to offer Charles [name] the most up to date genetic testing.   Genetic testing did detect two Variants of Unknown Significance - one in the ALK gene called c.2229C>T (Silent) and the other in CDC73 gene called c.416C>A (p.Arg139Gln).  At this time, it is unknown if these variants are associated with an increased cancer risk or if they are a normal finding, but most variants such as this get reclassified to being inconsequential. They should not be used to make medical management decisions. With time, we suspect the lab will determine the significance of these variants, if any. If we do learn more about them, we will try to contact Charles [name] to discuss it further. However, it is important to stay in touch with [place] periodically  and keep the address and phone number up to date.   UPDATE: ALK c.2229C>T (Silent) has been reclassified to Likely Benign.  The amended report date is July 18, 2020.  CANCER SCREENING RECOMMENDATIONS: This result is reassuring and indicates that Charles [name] likely does not have an increased risk for a future cancer due to a mutation in one of these genes. This normal test also suggests that Charles [name] cancer was most likely not due to an inherited predisposition associated with one of these genes.  Most cancers happen by chance and this negative test suggests that his cancer falls  into this category.  We, therefore, recommended he continue to follow the cancer management and screening guidelines provided by his oncology and primary healthcare provider.   An individuals cancer risk and medical management are not determined by genetic test results alone. Overall cancer risk assessment incorporates additional factors, including personal medical history, family history, and any available genetic information that may result in a personalized plan for cancer prevention and surveillance.  This negative genetic test simply tells [place] that we cannot yet define why Charles [name] has had colorectal cancer at a young age. Charles [name] medical management and screening should be based on the prospect that he may be at an increased risk for a second colorectal cancer in the future and should, therefore, undergo more frequent colonoscopy screening at intervals determined by his GI providers.    RECOMMENDATIONS FOR FAMILY MEMBERS:  Individuals in this family might be at some increased risk of developing cancer, over the general population risk, simply due to the family history of cancer.  We recommended women in this family have a yearly mammogram beginning at age 35, or 38 years younger than the earliest onset of cancer, an annual clinical breast exam, and perform monthly breast self-exams. Women in this family should also have a gynecological exam as recommended by their primary provider. All close family members should have a colonoscopy starting 10 years younger than Charles [name] diagnosis.  FOLLOW-UP: Lastly, we discussed with Charles [name] that cancer genetics is a rapidly advancing field and it is possible that new genetic tests will be appropriate for him and/or his family members in the future. We encouraged him to remain in contact with cancer genetics on an annual basis so we can update his personal and family histories and let him know of advances in cancer genetics that may benefit this  family.   Our contact number was provided. Charles [name] questions were answered to his satisfaction, and he knows he is welcome to call us at anytime with additional questions or concerns.   Roma Kayser, MS, [facility] Certified Genetic Counselor [email or web address]

## 2017-08-29 ENCOUNTER — Inpatient Hospital Stay: Payer: 59

## 2017-08-29 ENCOUNTER — Inpatient Hospital Stay (HOSPITAL_BASED_OUTPATIENT_CLINIC_OR_DEPARTMENT_OTHER): Payer: 59 | Admitting: Oncology

## 2017-08-29 ENCOUNTER — Telehealth: Payer: Self-pay | Admitting: Oncology

## 2017-08-29 ENCOUNTER — Ambulatory Visit (HOSPITAL_COMMUNITY)
Admission: RE | Admit: 2017-08-29 | Discharge: 2017-08-29 | Disposition: A | Discharge status: Home / Self Care | Payer: 59 | Source: Ambulatory Visit | Attending: Oncology | Admitting: Oncology

## 2017-08-29 VITALS — BP 149/79 | HR 100 | Temp 97.9°F | Resp 20 | Ht 68.0 in | Wt 218.1 lb

## 2017-08-29 DIAGNOSIS — Z5111 Encounter for antineoplastic chemotherapy: Secondary | ICD-10-CM | POA: Diagnosis not present

## 2017-08-29 DIAGNOSIS — E119 Type 2 diabetes mellitus without complications: Secondary | ICD-10-CM

## 2017-08-29 DIAGNOSIS — C20 Malignant neoplasm of rectum: Secondary | ICD-10-CM

## 2017-08-29 DIAGNOSIS — K760 Fatty (change of) liver, not elsewhere classified: Secondary | ICD-10-CM | POA: Diagnosis not present

## 2017-08-29 DIAGNOSIS — R000 Tachycardia, unspecified: Secondary | ICD-10-CM | POA: Insufficient documentation

## 2017-08-29 DIAGNOSIS — Z95828 Presence of other vascular implants and grafts: Secondary | ICD-10-CM

## 2017-08-29 DIAGNOSIS — R0600 Dyspnea, unspecified: Secondary | ICD-10-CM | POA: Diagnosis not present

## 2017-08-29 DIAGNOSIS — B370 Candidal stomatitis: Secondary | ICD-10-CM | POA: Diagnosis not present

## 2017-08-29 DIAGNOSIS — Z8049 Family history of malignant neoplasm of other genital organs: Secondary | ICD-10-CM

## 2017-08-29 LAB — CBC WITH DIFFERENTIAL (CANCER CENTER ONLY)
BASOS ABS: 0 10*3/uL (ref 0.0–0.1)
Basophils Relative: 1 %
EOS ABS: 0.1 10*3/uL (ref 0.0–0.5)
Eosinophils Relative: 1 %
HCT: 38.5 % (ref 38.4–49.9)
Hemoglobin: 13.4 g/dL (ref 13.0–17.1)
Lymphocytes Relative: 34 %
Lymphs Abs: 1.5 10*3/uL (ref 0.9–3.3)
MCH: 30.7 pg (ref 27.2–33.4)
MCHC: 34.8 g/dL (ref 32.0–36.0)
MCV: 88.3 fL (ref 79.3–98.0)
Monocytes Absolute: 0.4 10*3/uL (ref 0.1–0.9)
Monocytes Relative: 9 %
Neutro Abs: 2.4 10*3/uL (ref 1.5–6.5)
Neutrophils Relative %: 55 %
PLATELETS: 112 10*3/uL — AB (ref 140–400)
RBC: 4.36 MIL/uL (ref 4.20–5.82)
RDW: 16.6 % — ABNORMAL HIGH (ref 11.0–14.6)
WBC: 4.3 10*3/uL (ref 4.0–10.3)

## 2017-08-29 LAB — CMP (CANCER CENTER ONLY)
ALK PHOS: 150 U/L — AB (ref 38–126)
ALT: 53 U/L — AB (ref 0–44)
AST: 39 U/L (ref 15–41)
Albumin: 3.9 g/dL (ref 3.5–5.0)
Anion gap: 11 (ref 5–15)
BILIRUBIN TOTAL: 0.4 mg/dL (ref 0.3–1.2)
BUN: 16 mg/dL (ref 6–20)
CO2: 24 mmol/L (ref 22–32)
CREATININE: 0.91 mg/dL (ref 0.61–1.24)
Calcium: 9.7 mg/dL (ref 8.9–10.3)
Chloride: 106 mmol/L (ref 98–111)
GFR, Est AFR Am: >60 mL/min (ref >60)
Glucose, Bld: 162 mg/dL — ABNORMAL HIGH (ref 70–99)
Potassium: 3.3 mmol/L — ABNORMAL LOW (ref 3.5–5.1)
Sodium: 141 mmol/L (ref 135–145)
TOTAL PROTEIN: 7.4 g/dL (ref 6.5–8.1)

## 2017-08-29 MED ORDER — LEUCOVORIN CALCIUM INJECTION 350 MG
400.0000 mg/m2 | Freq: Once | INTRAVENOUS | Status: AC
  Administered 2017-08-29: 888 mg via INTRAVENOUS
  Filled 2017-08-29: qty 44.4

## 2017-08-29 MED ORDER — PALONOSETRON HCL INJECTION 0.25 MG/5ML
0.2500 mg | Freq: Once | INTRAVENOUS | Status: AC
  Administered 2017-08-29: 0.25 mg via INTRAVENOUS

## 2017-08-29 MED ORDER — IOPAMIDOL (ISOVUE-370) INJECTION 76%
100.0000 mL | Freq: Once | INTRAVENOUS | Status: AC | PRN
  Administered 2017-08-29: 100 mL via INTRAVENOUS

## 2017-08-29 MED ORDER — OXALIPLATIN CHEMO INJECTION 100 MG/20ML
90.0000 mg/m2 | Freq: Once | INTRAVENOUS | Status: AC
  Administered 2017-08-29: 200 mg via INTRAVENOUS
  Filled 2017-08-29: qty 40

## 2017-08-29 MED ORDER — IOPAMIDOL (ISOVUE-370) INJECTION 76%
INTRAVENOUS | Status: AC
  Filled 2017-08-29: qty 100

## 2017-08-29 MED ORDER — SODIUM CHLORIDE 0.9 % IV SOLN
2400.0000 mg/m2 | INTRAVENOUS | Status: DC
  Administered 2017-08-29: 5350 mg via INTRAVENOUS
  Filled 2017-08-29: qty 107

## 2017-08-29 MED ORDER — DEXTROSE 5 % IV SOLN
INTRAVENOUS | Status: DC
  Administered 2017-08-29: 14:00:00 via INTRAVENOUS
  Filled 2017-08-29: qty 250

## 2017-08-29 MED ORDER — SODIUM CHLORIDE 0.9 % IV SOLN
Freq: Once | INTRAVENOUS | Status: AC
  Administered 2017-08-29: 14:00:00 via INTRAVENOUS
  Filled 2017-08-29: qty 5

## 2017-08-29 MED ORDER — FLUOROURACIL CHEMO INJECTION 2.5 GM/50ML
400.0000 mg/m2 | Freq: Once | INTRAVENOUS | Status: AC
  Administered 2017-08-29: 900 mg via INTRAVENOUS
  Filled 2017-08-29: qty 18

## 2017-08-29 MED ORDER — PALONOSETRON HCL INJECTION 0.25 MG/5ML
INTRAVENOUS | Status: AC
  Filled 2017-08-29: qty 5

## 2017-08-29 MED ORDER — SODIUM CHLORIDE 0.9% FLUSH
10.0000 mL | INTRAVENOUS | Status: DC | PRN
  Administered 2017-08-29: 10 mL
  Filled 2017-08-29: qty 10

## 2017-08-29 MED ORDER — DEXTROSE 5 % IV SOLN
Freq: Once | INTRAVENOUS | Status: AC
  Administered 2017-08-29: 13:00:00 via INTRAVENOUS
  Filled 2017-08-29: qty 250

## 2017-08-29 MED ORDER — CLOTRIMAZOLE 10 MG MT TROC: 10.0000 mg | Freq: Four times a day (QID) | OROMUCOSAL | 0 refills | Status: DC

## 2017-08-29 NOTE — Progress Notes (Signed)
[facility] OFFICE PROGRESS NOTE   Diagnosis: Rectal cancer  INTERVAL HISTORY:   Mr. Lottman completed another cycle of FOLFOX on 08/15/2017.  He reports cold sensitivity lasting for 1 week following chemotherapy.  No neuropathy symptoms at present.  He has developed a fullness at the lateral aspect of the right foot.  He reports an episode of dyspnea and anterior chest discomfort during the week following chemotherapy.  The discomfort lasted for 2 days.  No pleuritic pain.  No orthopnea.  He relates the pain to prolonged sitting in one position at his computer. He has persistent "thrush "over the tongue. He no longer has rectal bleeding.  Stool frequency has diminished. Objective:  Vital signs in last 24 hours:  Blood pressure (!) 149/79, pulse 100, temperature 97.9 F (36.6 C), temperature source Oral, resp. rate 20, height 5\' 8"  (1.727 m), weight 218 lb 1.6 oz (98.9 kg), SpO2 99 %.    HEENT: Mild white coat over the tongue, no buccal thrush, no ulcers Resp: Lungs clear bilaterally in the anterior and posterior lung fields, no respiratory distress Cardio: Regular rate and rhythm, tachycardia GI: No hepatosplenomegaly, nontender Vascular: No leg edema Neuro: The vibratory sense is intact at the fingertips bilaterally Skin: Hyperpigmentation of the hands and feet, skin thickening at the palms and soles.  Area of bony prominence and callus formation at the lateral aspect of the right foot  Portacath/PICC-without erythema  Lab Results:  Lab Results  Component Value Date   WBC 4.3 08/29/2017   HGB 13.4 08/29/2017   HCT 38.5 08/29/2017   MCV 88.3 08/29/2017   PLT 112 (L) 08/29/2017   NEUTROABS 2.4 08/29/2017    CMP  Lab Results  Component Value Date   NA 139 08/15/2017   K 3.7 08/15/2017   CL 105 08/15/2017   CO2 21 (L) 08/15/2017   GLUCOSE 118 (H) 08/15/2017   BUN 13 08/15/2017   CREATININE 1.00 08/15/2017   CALCIUM 9.2 08/15/2017   PROT 7.2 08/15/2017     ALBUMIN 3.8 08/15/2017   AST 18 08/15/2017   ALT 34 08/15/2017   ALKPHOS 134 (H) 08/15/2017   BILITOT 0.7 08/15/2017   GFRNONAA >60 08/15/2017   GFRAA >60 08/15/2017    Medications: I have reviewed the patient's current medications.   Assessment/Plan: 1. Rectal cancer, clinical stage III ? Colonoscopy 05/24/2017, mass at 12 cm from the anal verge, biopsy confirmed invasive poorly differentiated adenocarcinoma ? CTs 06/06/2017-rectal mass, perirectal adenopathy, no evidence of metastatic disease, nonspecific 1 cm sclerotic lesion at the right iliac crest ? MRI 06/06/2017, T3cN1tumor measured at 5.3 cm from the anal sphincter ? Cycle 1 FOLFOX 06/20/2017 ? Cycle 2 FOLFOX 07/04/2017 ? Cycle 3 FOLFOX 07/17/2017 ? Cycle 4 FOLFOX 08/01/2017 (Emend added secondary to prolonged nausea following chemotherapy) ? Cycle 5 FOLFOX 08/15/2017 ? Cycle 6 FOLFOX 08/29/2017  2. Diabetes 3. Glaucoma 4. Cecal polyp, tubular adenoma noted on the colonoscopy 05/24/2017 5. Port-A-Cath placement,Dr. White,06/17/2017    Disposition: Mr. Whedbee has completed 5 cycles of FOLFOX.  He has tolerated chemotherapy well.  He will complete cycle 6 today. He had an episode of chest pain and dyspnea last week.  He has persistent tachycardia.  I will refer him for a CT chest to rule out pulmonary embolism.  He will begin Mycelex troches for the oral candidiasis.  25 minutes were spent with the patient today.  The majority of the time was used for counseling and coordination of care.  Dominica Severin  Benay Spice, MD  08/29/2017  10:29 AM

## 2017-08-29 NOTE — Telephone Encounter (Signed)
PT scheduled per 8/22 sch message.  °

## 2017-08-29 NOTE — Patient Instructions (Signed)
[facility] Discharge Instructions for Patients Receiving Chemotherapy  Today you received the following chemotherapy agents Oxaliplatin, Leucovorin, 5FU  To help prevent nausea and vomiting after your treatment, we encourage you to take your nausea medication as directed   If you develop nausea and vomiting that is not controlled by your nausea medication, call the clinic.   BELOW ARE SYMPTOMS THAT SHOULD BE REPORTED IMMEDIATELY:  *FEVER GREATER THAN 100.5 F  *CHILLS WITH OR WITHOUT FEVER  NAUSEA AND VOMITING THAT IS NOT CONTROLLED WITH YOUR NAUSEA MEDICATION  *UNUSUAL SHORTNESS OF BREATH  *UNUSUAL BRUISING OR BLEEDING  TENDERNESS IN MOUTH AND THROAT WITH OR WITHOUT PRESENCE OF ULCERS  *URINARY PROBLEMS  *BOWEL PROBLEMS  UNUSUAL RASH Items with * indicate a potential emergency and should be followed up as soon as possible.  Feel free to call the clinic should you have any questions or concerns. The clinic phone number is [phone].  Please show the CHEMO ALERT CARD at check-in to the Emergency Department and triage nurse.   

## 2017-08-29 NOTE — Addendum Note (Signed)
Addended by: Mathis Fare on: 08/29/2017 11:07 AM   Modules accepted: Orders

## 2017-08-29 NOTE — Progress Notes (Signed)
Patient is aware to come in at 2:00 pm for his pump stop appointment.

## 2017-08-29 NOTE — Addendum Note (Signed)
Addended by: Betsy Coder B on: 08/29/2017 12:37 PM   Modules accepted: Orders

## 2017-08-30 ENCOUNTER — Telehealth: Payer: Self-pay

## 2017-08-30 NOTE — Telephone Encounter (Signed)
-----   Message from Ladell Pier, MD sent at 08/29/2017  4:32 PM EDT ----- Please call patient, chest CT is negative, no pulmonary embolism, follow-up as scheduled

## 2017-08-30 NOTE — Telephone Encounter (Signed)
Spoke with pt at [facility] clinic and relayed information. Pt and wife voiced understanding.

## 2017-08-31 ENCOUNTER — Inpatient Hospital Stay: Payer: 59

## 2017-08-31 VITALS — BP 148/69 | HR 107 | Temp 98.3°F | Resp 18

## 2017-08-31 DIAGNOSIS — C20 Malignant neoplasm of rectum: Secondary | ICD-10-CM

## 2017-08-31 MED ORDER — HEPARIN SOD (PORK) LOCK FLUSH 100 UNIT/ML IV SOLN
500.0000 [IU] | Freq: Once | INTRAVENOUS | Status: DC | PRN
  Filled 2017-08-31: qty 5

## 2017-08-31 MED ORDER — SODIUM CHLORIDE 0.9% FLUSH
10.0000 mL | INTRAVENOUS | Status: DC | PRN
  Filled 2017-08-31: qty 10

## 2017-09-12 ENCOUNTER — Inpatient Hospital Stay: Payer: 59 | Attending: Oncology

## 2017-09-12 ENCOUNTER — Inpatient Hospital Stay (HOSPITAL_BASED_OUTPATIENT_CLINIC_OR_DEPARTMENT_OTHER): Payer: 59 | Admitting: Oncology

## 2017-09-12 ENCOUNTER — Telehealth: Payer: Self-pay | Admitting: Oncology

## 2017-09-12 ENCOUNTER — Inpatient Hospital Stay: Payer: 59

## 2017-09-12 VITALS — BP 128/86 | HR 98

## 2017-09-12 DIAGNOSIS — C20 Malignant neoplasm of rectum: Secondary | ICD-10-CM

## 2017-09-12 DIAGNOSIS — B370 Candidal stomatitis: Secondary | ICD-10-CM

## 2017-09-12 DIAGNOSIS — Z5111 Encounter for antineoplastic chemotherapy: Secondary | ICD-10-CM | POA: Diagnosis not present

## 2017-09-12 DIAGNOSIS — D696 Thrombocytopenia, unspecified: Secondary | ICD-10-CM | POA: Diagnosis not present

## 2017-09-12 DIAGNOSIS — Z95828 Presence of other vascular implants and grafts: Secondary | ICD-10-CM

## 2017-09-12 DIAGNOSIS — R634 Abnormal weight loss: Secondary | ICD-10-CM

## 2017-09-12 DIAGNOSIS — R439 Unspecified disturbances of smell and taste: Secondary | ICD-10-CM

## 2017-09-12 DIAGNOSIS — R5383 Other fatigue: Secondary | ICD-10-CM

## 2017-09-12 DIAGNOSIS — E119 Type 2 diabetes mellitus without complications: Secondary | ICD-10-CM | POA: Insufficient documentation

## 2017-09-12 DIAGNOSIS — Z23 Encounter for immunization: Secondary | ICD-10-CM | POA: Insufficient documentation

## 2017-09-12 LAB — CMP (CANCER CENTER ONLY)
ALK PHOS: 178 U/L — AB (ref 38–126)
ALT: 83 U/L — ABNORMAL HIGH (ref 0–44)
ANION GAP: 11 (ref 5–15)
AST: 50 U/L — ABNORMAL HIGH (ref 15–41)
Albumin: 3.8 g/dL (ref 3.5–5.0)
BUN: 17 mg/dL (ref 6–20)
CALCIUM: 9.4 mg/dL (ref 8.9–10.3)
CO2: 23 mmol/L (ref 22–32)
Chloride: 106 mmol/L (ref 98–111)
Creatinine: 1.01 mg/dL (ref 0.61–1.24)
GFR, Estimated: >60 mL/min (ref >60)
Glucose, Bld: 177 mg/dL — ABNORMAL HIGH (ref 70–99)
POTASSIUM: 3.6 mmol/L (ref 3.5–5.1)
Sodium: 140 mmol/L (ref 135–145)
Total Bilirubin: 0.7 mg/dL (ref 0.3–1.2)
Total Protein: 7.3 g/dL (ref 6.5–8.1)

## 2017-09-12 LAB — CBC WITH DIFFERENTIAL (CANCER CENTER ONLY)
Basophils Absolute: 0 10*3/uL (ref 0.0–0.1)
Basophils Relative: 1 %
EOS ABS: 0.1 10*3/uL (ref 0.0–0.5)
Eosinophils Relative: 1 %
HEMATOCRIT: 39.3 % (ref 38.4–49.9)
HEMOGLOBIN: 13.4 g/dL (ref 13.0–17.1)
LYMPHS PCT: 37 %
Lymphs Abs: 1.5 10*3/uL (ref 0.9–3.3)
MCH: 31.1 pg (ref 27.2–33.4)
MCHC: 34.1 g/dL (ref 32.0–36.0)
MCV: 91.4 fL (ref 79.3–98.0)
MONOS PCT: 13 %
Monocytes Absolute: 0.5 10*3/uL (ref 0.1–0.9)
NEUTROS ABS: 2 10*3/uL (ref 1.5–6.5)
NEUTROS PCT: 48 %
Platelet Count: 118 10*3/uL — ABNORMAL LOW (ref 140–400)
RBC: 4.3 MIL/uL (ref 4.20–5.82)
RDW: 19.2 % — ABNORMAL HIGH (ref 11.0–14.6)
WBC Count: 4.1 10*3/uL (ref 4.0–10.3)

## 2017-09-12 MED ORDER — DEXTROSE 5 % IV SOLN
Freq: Once | INTRAVENOUS | Status: AC
  Administered 2017-09-12: 14:00:00 via INTRAVENOUS
  Filled 2017-09-12: qty 250

## 2017-09-12 MED ORDER — PALONOSETRON HCL INJECTION 0.25 MG/5ML
0.2500 mg | Freq: Once | INTRAVENOUS | Status: AC
  Administered 2017-09-12: 0.25 mg via INTRAVENOUS

## 2017-09-12 MED ORDER — LEUCOVORIN CALCIUM INJECTION 350 MG
400.0000 mg/m2 | Freq: Once | INTRAVENOUS | Status: AC
  Administered 2017-09-12: 856 mg via INTRAVENOUS
  Filled 2017-09-12: qty 42.8

## 2017-09-12 MED ORDER — SODIUM CHLORIDE 0.9 % IV SOLN
2400.0000 mg/m2 | INTRAVENOUS | Status: DC
  Administered 2017-09-12: 5150 mg via INTRAVENOUS
  Filled 2017-09-12: qty 103

## 2017-09-12 MED ORDER — OXALIPLATIN CHEMO INJECTION 100 MG/20ML: 200.0000 mg | Freq: Once | INTRAVENOUS | Status: DC

## 2017-09-12 MED ORDER — DEXTROSE 5 % IV SOLN
Freq: Once | INTRAVENOUS | Status: AC
  Administered 2017-09-12: 12:00:00 via INTRAVENOUS
  Filled 2017-09-12: qty 250

## 2017-09-12 MED ORDER — FLUOROURACIL CHEMO INJECTION 2.5 GM/50ML
400.0000 mg/m2 | Freq: Once | INTRAVENOUS | Status: AC
  Administered 2017-09-12: 850 mg via INTRAVENOUS
  Filled 2017-09-12: qty 17

## 2017-09-12 MED ORDER — OXALIPLATIN CHEMO INJECTION 100 MG/20ML
85.0000 mg/m2 | Freq: Once | INTRAVENOUS | Status: AC
  Administered 2017-09-12: 180 mg via INTRAVENOUS
  Filled 2017-09-12: qty 36

## 2017-09-12 MED ORDER — SODIUM CHLORIDE 0.9% FLUSH
10.0000 mL | INTRAVENOUS | Status: DC | PRN
  Administered 2017-09-12: 10 mL
  Filled 2017-09-12: qty 10

## 2017-09-12 MED ORDER — FLUCONAZOLE 100 MG PO TABS: 100.0000 mg | ORAL_TABLET | Freq: Every day | ORAL | 1 refills | Status: DC

## 2017-09-12 MED ORDER — SODIUM CHLORIDE 0.9 % IV SOLN
Freq: Once | INTRAVENOUS | Status: AC
  Administered 2017-09-12: 13:00:00 via INTRAVENOUS
  Filled 2017-09-12: qty 5

## 2017-09-12 MED ORDER — PALONOSETRON HCL INJECTION 0.25 MG/5ML
INTRAVENOUS | Status: AC
  Filled 2017-09-12: qty 5

## 2017-09-12 NOTE — Progress Notes (Signed)
  [facility] OFFICE PROGRESS NOTE   Diagnosis: Rectal cancer  INTERVAL HISTORY:   Charles [name] completed another cycle of FOLFOX on 08/29/2017.  No nausea or vomiting.  He reports prolonged cold sensitivity following chemotherapy.  He continues to have cold sensitivity.  No other neuropathy symptoms.  His chief complaint is fatigue.  He has some blood when he blows his nose.  No other bleeding.  He had one day of diarrhea following chemotherapy.  Rectal bleeding has improved. A CT of the chest 08/29/2017 was negative for pulmonary embolism. Objective:  Vital signs in last 24 hours:  Blood pressure 129/77, pulse (!) 111, temperature 97.8 F (36.6 C), temperature source Oral, resp. rate 18, height 5\' 8"  (1.727 m), weight 210 lb 11.2 oz (95.6 kg), SpO2 100 %.    HEENT: White coat over the tongue, no buccal thrush.  No ulcers. Resp: Lungs clear bilaterally Cardio: Regular rate and rhythm GI: No hepatomegaly, no mass, nontender Vascular: Leg edema Neuro: Mild loss of vibratory sense at the right greater than left fingertips Skin: Hyperpigmentation of the hands and feet, no skin breakdown  Portacath/PICC-without erythema  Lab Results:  Lab Results  Component Value Date   WBC 4.1 09/12/2017   HGB 13.4 09/12/2017   HCT 39.3 09/12/2017   MCV 91.4 09/12/2017   PLT 118 (L) 09/12/2017   NEUTROABS 2.0 09/12/2017    CMP  Lab Results  Component Value Date   NA 140 09/12/2017   K 3.6 09/12/2017   CL 106 09/12/2017   CO2 23 09/12/2017   GLUCOSE 177 (H) 09/12/2017   BUN 17 09/12/2017   CREATININE 1.01 09/12/2017   CALCIUM 9.4 09/12/2017   PROT 7.3 09/12/2017   ALBUMIN 3.8 09/12/2017   AST 50 (H) 09/12/2017   ALT 83 (H) 09/12/2017   ALKPHOS 178 (H) 09/12/2017   BILITOT 0.7 09/12/2017   GFRNONAA >60 09/12/2017   GFRAA >60 09/12/2017    Medications: I have reviewed the patient's current medications.   Assessment/Plan: 1. Rectal cancer, clinical stage  III ? Colonoscopy 05/24/2017, mass at 12 cm from the anal verge, biopsy confirmed invasive poorly differentiated adenocarcinoma ? CTs 06/06/2017-rectal mass, perirectal adenopathy, no evidence of metastatic disease, nonspecific 1 cm sclerotic lesion at the right iliac crest ? MRI 06/06/2017, T3cN1tumor measured at 5.3 cm from the anal sphincter ? Cycle 1 FOLFOX 06/20/2017 ? Cycle 2 FOLFOX 07/04/2017 ? Cycle 3 FOLFOX 07/17/2017 ? Cycle 4 FOLFOX 08/01/2017 (Emend added secondary to prolonged nausea following chemotherapy) ? Cycle 5 FOLFOX 08/15/2017 ? Cycle 6 FOLFOX 08/29/2017 ? Cycle 7 FOLFOX 09/12/2017  2. Diabetes 3. Glaucoma 4. Cecal polyp, tubular adenoma noted on the colonoscopy 05/24/2017 5. Port-A-Cath placement,Dr. White,06/17/2017     Disposition: [name] has completed 6 cycles of FOLFOX.  He appears stable.  He is tolerating the chemotherapy well.  He will complete cycle 7 today.  He will return for office visit and the final planned cycle of FOLFOX in 2 weeks.  He will complete another course of Diflucan for oral candidiasis.  He is now using Celexa troches.  He has lost weight since beginning chemotherapy.  He relates weight loss to altered taste.  I adjusted the chemotherapy doses based on his current weight.  The plan is to begin concurrent capecitabine and radiation at the completion of FOLFOX chemotherapy.  Betsy Coder, MD  09/12/2017  11:46 AM

## 2017-09-12 NOTE — Telephone Encounter (Signed)
Scheduled appt per 9/5 los - pt to get an updated schedule next visit.  

## 2017-09-12 NOTE — Patient Instructions (Signed)
[facility] Discharge Instructions for Patients Receiving Chemotherapy  Today you received the following chemotherapy agents :  Oxaliplatin,  Leucovorin,  Fluorouracil.  To help prevent nausea and vomiting after your treatment, we encourage you to take your nausea medication as prescribed.   If you develop nausea and vomiting that is not controlled by your nausea medication, call the clinic.   BELOW ARE SYMPTOMS THAT SHOULD BE REPORTED IMMEDIATELY:  *FEVER GREATER THAN 100.5 F  *CHILLS WITH OR WITHOUT FEVER  NAUSEA AND VOMITING THAT IS NOT CONTROLLED WITH YOUR NAUSEA MEDICATION  *UNUSUAL SHORTNESS OF BREATH  *UNUSUAL BRUISING OR BLEEDING  TENDERNESS IN MOUTH AND THROAT WITH OR WITHOUT PRESENCE OF ULCERS  *URINARY PROBLEMS  *BOWEL PROBLEMS  UNUSUAL RASH Items with * indicate a potential emergency and should be followed up as soon as possible.  Feel free to call the clinic should you have any questions or concerns. The clinic phone number is [phone].  Please show the CHEMO ALERT CARD at check-in to the Emergency Department and triage nurse.   

## 2017-09-14 ENCOUNTER — Inpatient Hospital Stay: Payer: 59

## 2017-09-14 VITALS — BP 126/75 | HR 100 | Temp 99.0°F | Resp 19

## 2017-09-14 DIAGNOSIS — C20 Malignant neoplasm of rectum: Secondary | ICD-10-CM

## 2017-09-14 DIAGNOSIS — Z5111 Encounter for antineoplastic chemotherapy: Secondary | ICD-10-CM | POA: Diagnosis not present

## 2017-09-14 MED ORDER — HEPARIN SOD (PORK) LOCK FLUSH 100 UNIT/ML IV SOLN
500.0000 [IU] | Freq: Once | INTRAVENOUS | Status: AC | PRN
  Administered 2017-09-14: 500 [IU]
  Filled 2017-09-14: qty 5

## 2017-09-14 MED ORDER — SODIUM CHLORIDE 0.9% FLUSH
10.0000 mL | INTRAVENOUS | Status: DC | PRN
  Administered 2017-09-14: 10 mL
  Filled 2017-09-14: qty 10

## 2017-09-20 DIAGNOSIS — C20 Malignant neoplasm of rectum: Secondary | ICD-10-CM | POA: Diagnosis not present

## 2017-09-22 ENCOUNTER — Other Ambulatory Visit: Payer: Self-pay | Admitting: Oncology

## 2017-09-26 ENCOUNTER — Inpatient Hospital Stay (HOSPITAL_BASED_OUTPATIENT_CLINIC_OR_DEPARTMENT_OTHER): Payer: 59 | Admitting: Nurse Practitioner

## 2017-09-26 ENCOUNTER — Inpatient Hospital Stay: Payer: 59

## 2017-09-26 ENCOUNTER — Encounter: Payer: Self-pay | Admitting: Nurse Practitioner

## 2017-09-26 VITALS — BP 128/71 | HR 115 | Temp 98.4°F | Resp 18 | Ht 68.0 in | Wt 210.8 lb

## 2017-09-26 DIAGNOSIS — E119 Type 2 diabetes mellitus without complications: Secondary | ICD-10-CM

## 2017-09-26 DIAGNOSIS — R200 Anesthesia of skin: Secondary | ICD-10-CM | POA: Diagnosis not present

## 2017-09-26 DIAGNOSIS — C20 Malignant neoplasm of rectum: Secondary | ICD-10-CM | POA: Diagnosis not present

## 2017-09-26 DIAGNOSIS — Z5111 Encounter for antineoplastic chemotherapy: Secondary | ICD-10-CM | POA: Diagnosis not present

## 2017-09-26 DIAGNOSIS — Z23 Encounter for immunization: Secondary | ICD-10-CM

## 2017-09-26 DIAGNOSIS — R5383 Other fatigue: Secondary | ICD-10-CM

## 2017-09-26 DIAGNOSIS — D696 Thrombocytopenia, unspecified: Secondary | ICD-10-CM

## 2017-09-26 DIAGNOSIS — Z95828 Presence of other vascular implants and grafts: Secondary | ICD-10-CM

## 2017-09-26 LAB — CMP (CANCER CENTER ONLY)
ALT: 76 U/L — ABNORMAL HIGH (ref 0–44)
ANION GAP: 12 (ref 5–15)
AST: 52 U/L — ABNORMAL HIGH (ref 15–41)
Albumin: 3.7 g/dL (ref 3.5–5.0)
Alkaline Phosphatase: 163 U/L — ABNORMAL HIGH (ref 38–126)
BUN: 11 mg/dL (ref 6–20)
CHLORIDE: 103 mmol/L (ref 98–111)
CO2: 23 mmol/L (ref 22–32)
Calcium: 9.2 mg/dL (ref 8.9–10.3)
Creatinine: 1.02 mg/dL (ref 0.61–1.24)
GFR, Estimated: >60 mL/min (ref >60)
Glucose, Bld: 299 mg/dL — ABNORMAL HIGH (ref 70–99)
POTASSIUM: 3.4 mmol/L — AB (ref 3.5–5.1)
SODIUM: 138 mmol/L (ref 135–145)
Total Bilirubin: 0.5 mg/dL (ref 0.3–1.2)
Total Protein: 7.3 g/dL (ref 6.5–8.1)

## 2017-09-26 LAB — CBC WITH DIFFERENTIAL (CANCER CENTER ONLY)
BASOS PCT: 1 %
Basophils Absolute: 0.1 10*3/uL (ref 0.0–0.1)
Eosinophils Absolute: 0.1 10*3/uL (ref 0.0–0.5)
Eosinophils Relative: 2 %
HEMATOCRIT: 38.4 % (ref 38.4–49.9)
Hemoglobin: 13.2 g/dL (ref 13.0–17.1)
LYMPHS PCT: 31 %
Lymphs Abs: 1.2 10*3/uL (ref 0.9–3.3)
MCH: 31.8 pg (ref 27.2–33.4)
MCHC: 34.4 g/dL (ref 32.0–36.0)
MCV: 92.6 fL (ref 79.3–98.0)
MONO ABS: 0.5 10*3/uL (ref 0.1–0.9)
MONOS PCT: 12 %
NEUTROS ABS: 2.2 10*3/uL (ref 1.5–6.5)
Neutrophils Relative %: 54 %
PLATELETS: 102 10*3/uL — AB (ref 140–400)
RBC: 4.15 MIL/uL — ABNORMAL LOW (ref 4.20–5.82)
RDW: 19.5 % — AB (ref 11.0–14.6)
WBC Count: 4 10*3/uL (ref 4.0–10.3)

## 2017-09-26 MED ORDER — SODIUM CHLORIDE 0.9 % IV SOLN
Freq: Once | INTRAVENOUS | Status: AC
  Administered 2017-09-26: 13:00:00 via INTRAVENOUS
  Filled 2017-09-26: qty 5

## 2017-09-26 MED ORDER — FLUCONAZOLE 100 MG PO TABS: 100.0000 mg | ORAL_TABLET | Freq: Every day | ORAL | 1 refills | Status: DC

## 2017-09-26 MED ORDER — OXALIPLATIN CHEMO INJECTION 100 MG/20ML
65.0000 mg/m2 | Freq: Once | INTRAVENOUS | Status: AC
  Administered 2017-09-26: 140 mg via INTRAVENOUS
  Filled 2017-09-26: qty 28

## 2017-09-26 MED ORDER — SODIUM CHLORIDE 0.9% FLUSH
10.0000 mL | INTRAVENOUS | Status: DC | PRN
  Administered 2017-09-26: 10 mL
  Filled 2017-09-26: qty 10

## 2017-09-26 MED ORDER — SODIUM CHLORIDE 0.9 % IV SOLN
2400.0000 mg/m2 | INTRAVENOUS | Status: DC
  Administered 2017-09-26: 5150 mg via INTRAVENOUS
  Filled 2017-09-26: qty 103

## 2017-09-26 MED ORDER — PALONOSETRON HCL INJECTION 0.25 MG/5ML
INTRAVENOUS | Status: AC
  Filled 2017-09-26: qty 5

## 2017-09-26 MED ORDER — FLUOROURACIL CHEMO INJECTION 2.5 GM/50ML
400.0000 mg/m2 | Freq: Once | INTRAVENOUS | Status: AC
  Administered 2017-09-26: 850 mg via INTRAVENOUS
  Filled 2017-09-26: qty 17

## 2017-09-26 MED ORDER — DEXTROSE 5 % IV SOLN
Freq: Once | INTRAVENOUS | Status: AC
  Administered 2017-09-26: 12:00:00 via INTRAVENOUS
  Filled 2017-09-26: qty 250

## 2017-09-26 MED ORDER — PALONOSETRON HCL INJECTION 0.25 MG/5ML
0.2500 mg | Freq: Once | INTRAVENOUS | Status: AC
  Administered 2017-09-26: 0.25 mg via INTRAVENOUS

## 2017-09-26 MED ORDER — LEUCOVORIN CALCIUM INJECTION 350 MG
400.0000 mg/m2 | Freq: Once | INTRAVENOUS | Status: AC
  Administered 2017-09-26: 856 mg via INTRAVENOUS
  Filled 2017-09-26: qty 42.8

## 2017-09-26 MED ORDER — DEXTROSE 5 % IV SOLN
INTRAVENOUS | Status: DC
  Administered 2017-09-26: 12:00:00 via INTRAVENOUS
  Filled 2017-09-26: qty 250

## 2017-09-26 MED ORDER — INFLUENZA VAC SPLIT QUAD 0.5 ML IM SUSY
0.5000 mL | PREFILLED_SYRINGE | Freq: Once | INTRAMUSCULAR | Status: AC
  Administered 2017-09-26: 0.5 mL via INTRAMUSCULAR

## 2017-09-26 MED ORDER — INFLUENZA VAC SPLIT QUAD 0.5 ML IM SUSY
PREFILLED_SYRINGE | INTRAMUSCULAR | Status: AC
  Filled 2017-09-26: qty 0.5

## 2017-09-26 NOTE — Patient Instructions (Signed)
[facility] Discharge Instructions for Patients Receiving Chemotherapy  Today you received the following chemotherapy agents :  Oxaliplatin,  Leucovorin,  Fluorouracil.  To help prevent nausea and vomiting after your treatment, we encourage you to take your nausea medication as prescribed.   If you develop nausea and vomiting that is not controlled by your nausea medication, call the clinic.   BELOW ARE SYMPTOMS THAT SHOULD BE REPORTED IMMEDIATELY:  *FEVER GREATER THAN 100.5 F  *CHILLS WITH OR WITHOUT FEVER  NAUSEA AND VOMITING THAT IS NOT CONTROLLED WITH YOUR NAUSEA MEDICATION  *UNUSUAL SHORTNESS OF BREATH  *UNUSUAL BRUISING OR BLEEDING  TENDERNESS IN MOUTH AND THROAT WITH OR WITHOUT PRESENCE OF ULCERS  *URINARY PROBLEMS  *BOWEL PROBLEMS  UNUSUAL RASH Items with * indicate a potential emergency and should be followed up as soon as possible.  Feel free to call the clinic should you have any questions or concerns. The clinic phone number is [phone].  Please show the CHEMO ALERT CARD at check-in to the Emergency Department and triage nurse.   

## 2017-09-26 NOTE — Progress Notes (Signed)
GI Location of Tumor / Histology: Rectal Cancer  Charles [name] presented to Dr. Michail Sermon with rectal bleeding and intermittent blood tinged stools.    Biopsies of LG Intestine-Cecum, Polyp and LG Intestine, Recto-Sigmoid. 05/24/2017   Colonoscopy 05/24/2017: Likely malignant tumor in the rectosigmoid colon at 12 cm proximal to the anus.  CT C/A/P 06/06/2017: Eccentric hyperenhancing upper left rectal 2.9 cm mass.  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  No definite additional sites of metastatic disease.  Solitary necrotic lesion in the superior right iliac crest nonspecific.  MRI Pelvis 06/06/2017: rectal adenocarcinoma T3c N1, 5.3 cm from the anal sphincter by MRI- interpreted as mid rectum.  Past/Anticipated interventions by surgeon, if any:  Port-A-Cath placement,Dr. White,06/17/2017  Dr. Nadeen Landau 06/07/2017 - Referrals underway for medical oncology and radiation oncology. - Return to the office in 4 weeks for follow-up and coordination of care or sooner based on MDT if necessary.   - We discussed at length today the anatomy physiology of the GI tract.   -We discussed the pathophysiology of rectal cancer.  We also discussed the pathophysiology of colorectal cancer.   -We discussed the role for surgery and potentially medical oncology as well as radiation oncology for neoadjuvant chemoradiation.      Past/Anticipated interventions by medical oncology, if any:   Dr. Benay Spice 06/14/2017 1:45 pm       09-26-17 [name] NP /Dr. Macon Large cancer, clinical stage III ? Colonoscopy 05/24/2017, mass at 12 cm from the anal verge, biopsy confirmed invasive poorly differentiated adenocarcinoma ? CTs 06/06/2017-rectal mass, perirectal adenopathy, no evidence of metastatic disease, nonspecific 1 cm sclerotic lesion at the right iliac crest ? MRI 06/06/2017, T3cN1tumor measured at 5.3 cm from the anal  sphincter ? Cycle 1 FOLFOX 06/20/2017 ? Cycle 2 FOLFOX 07/04/2017 ? Cycle 3 FOLFOX 07/17/2017 ? Cycle 4 FOLFOX 08/01/2017 (Emend added secondary to prolonged nausea following chemotherapy) ? Cycle 5 FOLFOX 08/15/2017 ? Cycle 6 FOLFOX 08/29/2017 ? Cycle 7 FOLFOX 09/12/2017 ? Cycle 8 FOLFOX 09/26/2017 (dose reduced due to neuropathy and thrombocytopenia)  Has loss 4.3.2 oz. In the past week. Weight changes, if any:  Wt Readings from Last 3 Encounters:  10/02/17 205 lb 9.6 oz (93.3 kg)  09/26/17 210 lb 12.8 oz (95.6 kg)  09/12/17 210 lb 11.2 oz (95.6 kg)    Bowel/Bladder complaints, if GNO:IBBCW movement bid regular stool,urinating every 2 hours. Nausea / Vomiting, if any: No  [name] issues, if any: No  Any blood per rectum: No     SAFETY ISSUES:   Prior radiation? No  Pacemaker/ICD? No  Possible current pregnancy? No  Is the patient on methotrexate? No  Current Complaints/Details: Patient does have internal hemorrhoids. BP 134/80 (BP Location: Left Arm, Patient Position: Sitting)   Pulse (!) 103   Temp 97.9 F (36.6 C) (Oral)   Resp 20   Ht 5\' 8"  (1.727 m)   Wt 205 lb 9.6 oz (93.3 kg)   SpO2 100%   BMI 31.26 kg/m

## 2017-09-26 NOTE — Progress Notes (Signed)
  [facility] OFFICE PROGRESS NOTE   Diagnosis: Rectal cancer  INTERVAL HISTORY:   [name] returns as scheduled.  He completed cycle 7 FOLFOX 09/12/2017.  He denies nausea/vomiting.  No mouth sores.  No diarrhea.  He notes increased numbness in the fingertips and toes.  The numbness does not interfere with activity.  He has heartburn for a few days after chemotherapy.  He takes Tums as needed.  He notes progressive fatigue.  He continues to note loss of taste.  He is having less blood with nose blowing.  Objective:  Vital signs in last 24 hours:  Blood pressure 128/71, pulse (!) 115, temperature 98.4 F (36.9 C), temperature source Oral, resp. rate 18, height 5\' 8"  (1.727 m), weight 210 lb 12.8 oz (95.6 kg), SpO2 100 %.    HEENT: No thrush or ulcers. Resp: Lungs clear bilaterally. Cardio: Regular rate and rhythm. GI: Abdomen soft and nontender.  No hepatomegaly. Vascular: No leg edema. Neuro: Vibratory sense intact over the fingertips per tuning fork exam. Skin: Hands with hyperpigmentation. Port-A-Cath without erythema.   Lab Results:  Lab Results  Component Value Date   WBC 4.0 09/26/2017   HGB 13.2 09/26/2017   HCT 38.4 09/26/2017   MCV 92.6 09/26/2017   PLT 102 (L) 09/26/2017   NEUTROABS 2.2 09/26/2017    Imaging:  No results found.  Medications: I have reviewed the patient's current medications.  Assessment/Plan: 1. Rectal cancer, clinical stage III ? Colonoscopy 05/24/2017, mass at 12 cm from the anal verge, biopsy confirmed invasive poorly differentiated adenocarcinoma ? CTs 06/06/2017-rectal mass, perirectal adenopathy, no evidence of metastatic disease, nonspecific 1 cm sclerotic lesion at the right iliac crest ? MRI 06/06/2017, T3cN1tumor measured at 5.3 cm from the anal sphincter ? Cycle 1 FOLFOX 06/20/2017 ? Cycle 2 FOLFOX 07/04/2017 ? Cycle 3 FOLFOX 07/17/2017 ? Cycle 4 FOLFOX 08/01/2017 (Emend added secondary to prolonged nausea following  chemotherapy) ? Cycle 5 FOLFOX 08/15/2017 ? Cycle 6 FOLFOX 08/29/2017 ? Cycle 7 FOLFOX 09/12/2017 ? Cycle 8 FOLFOX 09/26/2017 (dose reduced due to neuropathy and thrombocytopenia)  2. Diabetes 3. Glaucoma 4. Cecal polyp, tubular adenoma noted on the colonoscopy 05/24/2017 5. Port-A-Cath placement,Dr. White,06/17/2017    Disposition: Charles [name] appears stable.  He has completed 7 cycles of neoadjuvant FOLFOX.  Plan to proceed with the eighth and final cycle of FOLFOX today as scheduled.  We reviewed the CBC from today.  He has mild thrombocytopenia.  He also has increased numbness in the hands and feet.  The Oxaliplatin will be dose reduced.  We made a referral to Dr. Lisbeth Renshaw.  We discussed potential toxicities associated with Xeloda.  He will return for lab and follow-up on 10/10/2017.  He will contact the office in the interim with any problems.  Plan reviewed with Dr. Benay Spice.    [name] ANP/GNP-BC   09/26/2017  12:07 PM

## 2017-09-28 ENCOUNTER — Inpatient Hospital Stay: Payer: 59

## 2017-09-28 VITALS — BP 123/76 | HR 118 | Temp 98.3°F | Resp 18

## 2017-09-28 DIAGNOSIS — Z5111 Encounter for antineoplastic chemotherapy: Secondary | ICD-10-CM | POA: Diagnosis not present

## 2017-09-28 DIAGNOSIS — C20 Malignant neoplasm of rectum: Secondary | ICD-10-CM

## 2017-09-28 MED ORDER — SODIUM CHLORIDE 0.9% FLUSH
10.0000 mL | INTRAVENOUS | Status: AC | PRN
  Administered 2017-09-28: 10 mL
  Filled 2017-09-28: qty 10

## 2017-09-28 MED ORDER — HEPARIN SOD (PORK) LOCK FLUSH 100 UNIT/ML IV SOLN
500.0000 [IU] | Freq: Once | INTRAVENOUS | Status: AC | PRN
  Administered 2017-09-28: 500 [IU]
  Filled 2017-09-28: qty 5

## 2017-10-02 ENCOUNTER — Ambulatory Visit
Admission: RE | Admit: 2017-10-02 | Discharge: 2017-10-02 | Disposition: A | Discharge status: Home / Self Care | Payer: 59 | Source: Ambulatory Visit | Attending: Radiation Oncology | Admitting: Radiation Oncology

## 2017-10-02 ENCOUNTER — Encounter: Payer: Self-pay | Admitting: Radiation Oncology

## 2017-10-02 ENCOUNTER — Other Ambulatory Visit: Payer: Self-pay

## 2017-10-02 VITALS — BP 134/80 | HR 103 | Temp 97.9°F | Resp 20 | Ht 68.0 in | Wt 205.6 lb

## 2017-10-02 DIAGNOSIS — Z79899 Other long term (current) drug therapy: Secondary | ICD-10-CM | POA: Diagnosis not present

## 2017-10-02 DIAGNOSIS — Z794 Long term (current) use of insulin: Secondary | ICD-10-CM | POA: Insufficient documentation

## 2017-10-02 DIAGNOSIS — E119 Type 2 diabetes mellitus without complications: Secondary | ICD-10-CM | POA: Insufficient documentation

## 2017-10-02 DIAGNOSIS — E785 Hyperlipidemia, unspecified: Secondary | ICD-10-CM | POA: Insufficient documentation

## 2017-10-02 DIAGNOSIS — C20 Malignant neoplasm of rectum: Secondary | ICD-10-CM

## 2017-10-02 DIAGNOSIS — Z808 Family history of malignant neoplasm of other organs or systems: Secondary | ICD-10-CM | POA: Diagnosis not present

## 2017-10-02 DIAGNOSIS — Z881 Allergy status to other antibiotic agents status: Secondary | ICD-10-CM | POA: Diagnosis not present

## 2017-10-02 NOTE — Progress Notes (Addendum)
Radiation Oncology         (336) [phone] ________________________________ Initial Outpatient Consult Note Name: Charles [name]        MRN: [id number]  Date of Service: 10/02/2017 DOB: 1965-12-11  YI:FOYDXAJ, Dibas, MD  Charles Pier, MD     REFERRING PHYSICIAN: Ladell Pier, MD   DIAGNOSIS: The encounter diagnosis was Rectal cancer [facility]).   HISTORY OF PRESENT ILLNESS: Charles [name] is a 52 y.o. male originally seen at the request of Dr. Dema [name] for newly diagnosed rectal cancer. The patient presented to Dr. Michail [name] with a one month history of rectal bleeding and intermittent blood tinged stools. He was referred to Dr. Dema [name] and proceeded to undergo colonoscopy on 05/24/17. This showed a likely malignant tumor in the rectosigmoid colon approximately 12 cm proximal to the anus. Biopsy revealed  poorly differentiated adenocarcinoma with associated low and high grade adenomatous dysplasia in the recto-sigmoid as well as tubular adenoma in the cecum.  Staging CT on 06/06/17 revealed eccentric hyperenhancing upper left rectal mass measuring 2.9 cm as well as multiple enlarged left posterior perirectal and upper left presacral nodes compatible with pelvic nodal metastases. There were no definite additional sites of metastatic disease but there was a solitary necrotic lesion in the superior right iliac crest which was nonspecific. He had an MRI of the pelvis on 06/06/17 which revealed a T3c, N1 rectal cancer approximately 5.3 cm from the anal sphincter.  He was last seen in June, and had been counseled on the rationale to proceed with total neoadjuvant chemotherapy with FOLFOX.  Dr. Malachy [name] has managed his treatment course, and he completed 8 cycles between 06/20/2017 and 09/26/2017.  He comes today to discuss the plans for chemo RT with sensitizing Xeloda.  PREVIOUS RADIATION THERAPY: No   PAST MEDICAL HISTORY:  Past Medical History:  Diagnosis Date  . Diabetes mellitus without complication ([place])     Type II  . Family history of colon cancer   . Family history of prostate cancer   . Family history of stomach cancer   . Family history of uterine cancer   . Hyperlipidemia        PAST SURGICAL HISTORY: Past Surgical History:  Procedure Laterality Date  . ACHILLES TENDON REPAIR Right 2005  . APPENDECTOMY    . HERNIA REPAIR     as a baby  . PORTACATH PLACEMENT N/A 06/17/2017   Procedure: RIGHT VS LEFT INTERNAL JUGULAR PORT-A-CATH PLACEMENT WITH ULTRASOUND;  Surgeon: Charles Roup, MD;  Location: [facility] ORS;  Service: General;  Laterality: N/A;  FLURO     FAMILY HISTORY:  Family History  Problem Relation Age of Onset  . Hypertension Mother   . Hyperlipidemia Mother   . Non-Hodgkin's lymphoma Maternal Aunt 39  . Heart disease Maternal Grandmother   . Uterine cancer Maternal Grandmother        dx under 44  . Cirrhosis Maternal Grandfather   . Colon cancer Other        MGF sister  . Stomach cancer Other        MGMs brother  . Prostate cancer Other        MGMs brother  . Prostate cancer Cousin        mother's mat frist cousin     SOCIAL HISTORY:  reports that he has never smoked. He has never used smokeless tobacco. He reports that he drinks alcohol. He reports that he does not use drugs.  He is married and lives in [place].  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  He has been on medical disability since May of this year.  He is accompanied by his wife and they have 4 adult children  ALLERGIES: Cefradine [cephradine]   MEDICATIONS:  Current Outpatient Medications  Medication Sig Dispense Refill  . B-D UF III MINI PEN NEEDLES 31G X 5 MM MISC USE AS DIRECTED EVERY DAY  11  . cetirizine (ZYRTEC) 10 MG tablet Take 10 mg by mouth daily as needed for allergies.    . clotrimazole (MYCELEX) 10 MG troche Take 1 tablet (10 mg total) by mouth 4 (four) times daily. 120 tablet 0  . Continuous Blood Gluc Sensor  (FREESTYLE LIBRE 14 DAY SENSOR) MISC APPLY 1 SENSOR TO THE BACK OF UPPER ARM EVERY 14 DAYS  11  . empagliflozin (JARDIANCE) 25 MG TABS tablet Take 25 mg by mouth daily.    . fluconazole (DIFLUCAN) 100 MG tablet Take 1 tablet (100 mg total) by mouth daily. 7 tablet 1  . ibuprofen (ADVIL,MOTRIN) 200 MG tablet Take 400 mg by mouth every 6 (six) hours as needed for mild pain.    Marland Kitchen insulin lispro protamine-lispro (HUMALOG 75/25 MIX) (75-25) 100 UNIT/ML SUSP injection Inject 60-150 Units into the skin 2 (two) times daily with a meal.     . lidocaine-prilocaine (EMLA) cream Apply 1 application topically as needed. 30 g 0  . liraglutide (VICTOZA) 18 MG/3ML SOPN Inject 1.8 mg into the skin daily.    Marland Kitchen NOVOLOG MIX 70/30 FLEXPEN (70-30) 100 UNIT/ML FlexPen INJECT 140 UNITS SUBCUTANEOUSLY AT BEGINNING OF BREAKFAST AND 40 UNITS AT EVENING MEAL  1  . potassium chloride SA (K-DUR,KLOR-CON) 20 MEQ tablet Take 1 tablet (20 mEq total) by mouth daily. 30 tablet 3  . atorvastatin (LIPITOR) 80 MG tablet Take 80 mg by mouth daily at 6 PM.     . prochlorperazine (COMPAZINE) 10 MG tablet Take 1 tablet (10 mg total) by mouth every 6 (six) hours as needed for nausea or vomiting. (Patient not taking: Reported on 10/02/2017) 30 tablet 0  . traMADol (ULTRAM) 50 MG tablet Take 1 tablet (50 mg total) by mouth every 6 (six) hours as needed (pain not controlled with ibuprofen and tylenol). (Patient not taking: Reported on 10/02/2017) 20 tablet 0   No current facility-administered medications for this encounter.    Facility-Administered Medications Ordered in Other Encounters  Medication Dose Route Frequency Provider Last Rate Last Dose  . sodium chloride flush (NS) 0.9 % injection 10 mL  10 mL Intracatheter PRN Charles Pier, MD   10 mL at 09/28/17 1343     REVIEW OF SYSTEMS: On review of systems, the patient reports that hes is doing well overall.  He states that he did tolerate chemotherapy but did have significant fatigue  and toward the last 2 cycles did experience some neuropathy in his fingertips and toes.  He states that he feels like he is starting to notice some improvement in his fatigue, and is ready to proceed.  He denies any chest pain, shortness of breath, cough, fevers, chills, night sweats, unintended weight changes.  He reports his bowels are moving well, and he is no longer seeing rectal bleeding.  He was previously having episodes of diarrhea during chemotherapy which has also resolved, and he notes normal bowel function at this time. He denies any  bladder disturbances, and denies abdominal pain, nausea or vomiting. He denies any new musculoskeletal  or joint aches or pains, new skin lesions or concerns. A complete review of systems is obtained and is otherwise negative.    PHYSICAL EXAM:  Wt Readings from Last 3 Encounters:  10/02/17 205 lb 9.6 oz (93.3 kg)  09/26/17 210 lb 12.8 oz (95.6 kg)  09/12/17 210 lb 11.2 oz (95.6 kg)   Temp Readings from Last 3 Encounters:  10/02/17 97.9 F (36.6 C) (Oral)  09/28/17 98.3 F (36.8 C) (Oral)  09/26/17 98.4 F (36.9 C) (Oral)   BP Readings from Last 3 Encounters:  10/02/17 134/80  09/28/17 123/76  09/26/17 128/71   Pulse Readings from Last 3 Encounters:  10/02/17 (!) 103  09/28/17 (!) 118  09/26/17 (!) 115   Pain Assessment Pain Score: 0-No pain/10  In general this is a well appearing African-American male in no acute distress.  He's alert and oriented x4 and appropriate throughout the examination. Cardiopulmonary assessment is negative for acute distress and he exhibits normal effort.   ECOG = 0  0 - Asymptomatic (Fully active, able to carry on all predisease activities without restriction)  1 - Symptomatic but completely ambulatory (Restricted in physically strenuous activity but ambulatory and able to carry out work of a light or sedentary nature. For example, light housework, office work)  2 - Symptomatic, <50% in bed during the day  (Ambulatory and capable of all self care but unable to carry out any work activities. Up and about more than 50% of waking hours)  3 - Symptomatic, >50% in bed, but not bedbound (Capable of only limited self-care, confined to bed or chair 50% or more of waking hours)  4 - Bedbound (Completely disabled. Cannot carry on any self-care. Totally confined to bed or chair)  5 - Death   Eustace Pen MM, Creech RH, Tormey DC, et al. [phone]). "Toxicity and response criteria of the [facility]". [place] Oncol. 5 (6): 649-55    LABORATORY DATA:  Lab Results  Component Value Date   WBC 4.0 09/26/2017   HGB 13.2 09/26/2017   HCT 38.4 09/26/2017   MCV 92.6 09/26/2017   PLT 102 (L) 09/26/2017   Lab Results  Component Value Date   NA 138 09/26/2017   K 3.4 (L) 09/26/2017   CL 103 09/26/2017   CO2 23 09/26/2017   Lab Results  Component Value Date   ALT 76 (H) 09/26/2017   AST 52 (H) 09/26/2017   ALKPHOS 163 (H) 09/26/2017   BILITOT 0.5 09/26/2017      RADIOGRAPHY: No results found.     IMPRESSION/PLAN: 1. Stage IIIB, cT3c, N1M0 adenocarcinoma of the rectum.  We met today with the patient to discuss the findings that were originally noted with his diagnosis, and reviewed his subsequent course with his systemic FOLFOX, he completed 8 cycles of therapy, and is now ready to proceed with chemoradiation.  We discussed the rationale for simulation, and he is interested in proceeding in the next couple of days.  The goal would be to start Xeloda and chemotherapy on October 14, 2017.  We discussed the risks, benefits, short, and long term effects of radiotherapy, and the patient is interested in proceeding. Dr. Lisbeth Renshaw discusses the delivery and logistics of radiotherapy and anticipates a course of 5-1/2 weeks of treatment. Written consent is obtained and placed in the chart, a copy was provided to the patient. 2. Insulin-dependent diabetes.  The patient has lost about 25 pounds since  he was diagnosed, and does not use sliding  scale insulin.  His endocrinologist Dr. Buddy Duty manages his diabetes medications, and we have encouraged him to make sure Dr. Buddy Duty is aware of his blood sugar levels if medications need to be adjusted if he continues to have weight loss.   We spent 60 minutes minutes face to face with the patient and more than 50% of that time was spent in counseling and/or coordination of care.  The above documentation reflects my direct findings during this shared patient visit. Please see the separate note by Dr. Lisbeth Renshaw on this date for the remainder of the patient's plan of care.     Carola Rhine, PAC

## 2017-10-03 NOTE — Progress Notes (Signed)
FMLA successfully faxed to [employer] at [phone]. Mailed copy to patient address on file. 

## 2017-10-09 ENCOUNTER — Ambulatory Visit
Admission: RE | Admit: 2017-10-09 | Discharge: 2017-10-09 | Disposition: A | Discharge status: Home / Self Care | Payer: 59 | Source: Ambulatory Visit | Attending: Radiation Oncology | Admitting: Radiation Oncology

## 2017-10-09 DIAGNOSIS — C20 Malignant neoplasm of rectum: Secondary | ICD-10-CM | POA: Diagnosis not present

## 2017-10-09 DIAGNOSIS — Z510 Encounter for antineoplastic radiation therapy: Secondary | ICD-10-CM | POA: Insufficient documentation

## 2017-10-10 ENCOUNTER — Inpatient Hospital Stay: Payer: 59 | Attending: Oncology | Admitting: Oncology

## 2017-10-10 ENCOUNTER — Encounter: Payer: Self-pay | Admitting: Oncology

## 2017-10-10 ENCOUNTER — Inpatient Hospital Stay: Payer: 59

## 2017-10-10 ENCOUNTER — Telehealth: Payer: Self-pay | Admitting: Oncology

## 2017-10-10 VITALS — BP 131/81 | HR 111 | Temp 98.5°F | Resp 20 | Ht 68.0 in | Wt 206.3 lb

## 2017-10-10 DIAGNOSIS — B370 Candidal stomatitis: Secondary | ICD-10-CM

## 2017-10-10 DIAGNOSIS — C20 Malignant neoplasm of rectum: Secondary | ICD-10-CM | POA: Diagnosis not present

## 2017-10-10 DIAGNOSIS — Z95828 Presence of other vascular implants and grafts: Secondary | ICD-10-CM

## 2017-10-10 DIAGNOSIS — Z510 Encounter for antineoplastic radiation therapy: Secondary | ICD-10-CM | POA: Diagnosis not present

## 2017-10-10 DIAGNOSIS — E119 Type 2 diabetes mellitus without complications: Secondary | ICD-10-CM | POA: Insufficient documentation

## 2017-10-10 LAB — CMP (CANCER CENTER ONLY)
ALK PHOS: 158 U/L — AB (ref 38–126)
ALT: 46 U/L — AB (ref 0–44)
ANION GAP: 11 (ref 5–15)
AST: 35 U/L (ref 15–41)
Albumin: 3.8 g/dL (ref 3.5–5.0)
BUN: 10 mg/dL (ref 6–20)
CALCIUM: 9.9 mg/dL (ref 8.9–10.3)
CO2: 23 mmol/L (ref 22–32)
Chloride: 106 mmol/L (ref 98–111)
Creatinine: 0.87 mg/dL (ref 0.61–1.24)
Glucose, Bld: 170 mg/dL — ABNORMAL HIGH (ref 70–99)
Potassium: 3.9 mmol/L (ref 3.5–5.1)
SODIUM: 140 mmol/L (ref 135–145)
TOTAL PROTEIN: 7.4 g/dL (ref 6.5–8.1)
Total Bilirubin: 0.5 mg/dL (ref 0.3–1.2)

## 2017-10-10 LAB — CBC WITH DIFFERENTIAL (CANCER CENTER ONLY)
Basophils Absolute: 0.1 10*3/uL (ref 0.0–0.1)
Basophils Relative: 2 %
EOS ABS: 0.1 10*3/uL (ref 0.0–0.5)
Eosinophils Relative: 2 %
HCT: 36.5 % — ABNORMAL LOW (ref 38.4–49.9)
HEMOGLOBIN: 12.8 g/dL — AB (ref 13.0–17.1)
LYMPHS ABS: 1.2 10*3/uL (ref 0.9–3.3)
LYMPHS PCT: 31 %
MCH: 32.9 pg (ref 27.2–33.4)
MCHC: 35.1 g/dL (ref 32.0–36.0)
MCV: 93.7 fL (ref 79.3–98.0)
MONOS PCT: 8 %
Monocytes Absolute: 0.3 10*3/uL (ref 0.1–0.9)
NEUTROS PCT: 57 %
Neutro Abs: 2.1 10*3/uL (ref 1.5–6.5)
Platelet Count: 132 10*3/uL — ABNORMAL LOW (ref 140–400)
RBC: 3.89 MIL/uL — AB (ref 4.20–5.82)
RDW: 18.8 % — ABNORMAL HIGH (ref 11.0–14.6)
WBC Count: 3.8 10*3/uL — ABNORMAL LOW (ref 4.0–10.3)

## 2017-10-10 MED ORDER — SODIUM CHLORIDE 0.9% FLUSH
10.0000 mL | INTRAVENOUS | Status: DC | PRN
  Administered 2017-10-10: 10 mL
  Filled 2017-10-10: qty 10

## 2017-10-10 MED ORDER — HEPARIN SOD (PORK) LOCK FLUSH 100 UNIT/ML IV SOLN
500.0000 [IU] | Freq: Once | INTRAVENOUS | Status: DC | PRN
  Filled 2017-10-10: qty 5

## 2017-10-10 NOTE — Progress Notes (Signed)
  Radiation Oncology         [phone]) [phone] ________________________________  Name: Charles [name] MRN: [id number]  Date: 10/09/2017  DOB: February 25, 1965  Optical Surface Tracking Plan:  Since intensity modulated radiotherapy (IMRT) and 3D conformal radiation treatment methods are predicated on accurate and precise positioning for treatment, intrafraction motion monitoring is medically necessary to ensure accurate and safe treatment delivery.  The ability to quantify intrafraction motion without excessive ionizing radiation dose can only be performed with optical surface tracking. Accordingly, surface imaging offers the opportunity to obtain 3D measurements of patient position throughout IMRT and 3D treatments without excessive radiation exposure.  I am ordering optical surface tracking for this patient's upcoming course of radiotherapy. ________________________________  Kyung Rudd, MD 10/10/2017 7:38 AM    Reference:   Particia Jasper, et al. Surface imaging-based analysis of intrafraction motion for breast radiotherapy patients.Journal of [employer], n. 6, nov. 2014. ISSN [id number].   Available at: <http://www.jacmp.org/index.php/jacmp/article/view/4957>.

## 2017-10-10 NOTE — Progress Notes (Signed)
  [facility] OFFICE PROGRESS NOTE   Diagnosis: Rectal cancer  INTERVAL HISTORY:   Charles [name] completed another cycle of FOLFOX 09/26/2017.  He has been evaluated by radiation oncology.  He is scheduled to begin concurrent radiation and capecitabine on 10/14/2017.  He reports the last cycle of FOLFOX was easier to tolerate.  He has tingling in the fingers.  This does not interfere with activity.  Persistent skin thickening at the hands and feet.  Rectal bleeding has improved.  He has noted recurrent thrush over the tongue.  Objective:  Vital signs in last 24 hours:  There were no vitals taken for this visit.    HEENT: White coat over the tongue, no buccal thrush Resp: Lungs clear bilaterally Cardio: Regular rate and rhythm GI: No hepatosplenomegaly, no mass, nontender Vascular: No leg edema  Skin: Skin thickening and hyperpigmentation of the hands and feet.  No skin breakdown or erythema  Portacath/PICC-without erythema  Lab Results:  Lab Results  Component Value Date   WBC 3.8 (L) 10/10/2017   HGB 12.8 (L) 10/10/2017   HCT 36.5 (L) 10/10/2017   MCV 93.7 10/10/2017   PLT 132 (L) 10/10/2017   NEUTROABS 2.1 10/10/2017    CMP  Lab Results  Component Value Date   NA 138 09/26/2017   K 3.4 (L) 09/26/2017   CL 103 09/26/2017   CO2 23 09/26/2017   GLUCOSE 299 (H) 09/26/2017   BUN 11 09/26/2017   CREATININE 1.02 09/26/2017   CALCIUM 9.2 09/26/2017   PROT 7.3 09/26/2017   ALBUMIN 3.7 09/26/2017   AST 52 (H) 09/26/2017   ALT 76 (H) 09/26/2017   ALKPHOS 163 (H) 09/26/2017   BILITOT 0.5 09/26/2017   GFRNONAA >60 09/26/2017   GFRAA >60 09/26/2017     Medications: I have reviewed the patient's current medications.   Assessment/Plan: 1. Rectal cancer, clinical stage III ? Colonoscopy 05/24/2017, mass at 12 cm from the anal verge, biopsy confirmed invasive poorly differentiated adenocarcinoma ? CTs 06/06/2017-rectal mass, perirectal adenopathy, no  evidence of metastatic disease, nonspecific 1 cm sclerotic lesion at the right iliac crest ? MRI 06/06/2017, T3cN1tumor measured at 5.3 cm from the anal sphincter ? Cycle 1 FOLFOX 06/20/2017 ? Cycle 2 FOLFOX 07/04/2017 ? Cycle 3 FOLFOX 07/17/2017 ? Cycle 4 FOLFOX 08/01/2017 (Emend added secondary to prolonged nausea following chemotherapy) ? Cycle 5 FOLFOX 08/15/2017 ? Cycle 6 FOLFOX 08/29/2017 ? Cycle 7 FOLFOX 09/12/2017 ? Cycle 8 FOLFOX 09/26/2017 (dose reduced due to neuropathy and thrombocytopenia)  2. Diabetes 3. Glaucoma 4. Cecal polyp, tubular adenoma noted on the colonoscopy 05/24/2017 5. Port-A-Cath placement,Dr. White,06/17/2017    Disposition: Charles [name] has completed 8 cycles of FOLFOX.  He has been evaluated by radiation oncology and is scheduled to begin treatment on 10/14/2017.  He will be treated with concurrent capecitabine.  I reviewed potential toxicities associated with capecitabine including the chance for mucositis, diarrhea, hematologic toxicity, hyperpigmentation, and hand/foot syndrome.  He agrees to proceed.  He will discontinue capecitabine and contact us if he develops significant diarrhea.  He will be scheduled for an office and lab visit on 10/28/2017.  25 minutes were spent with the patient today.  The majority of the time was used for counseling and coordination of care.  Betsy Coder, MD  10/10/2017  10:56 AM

## 2017-10-10 NOTE — Patient Instructions (Signed)
Implanted Port Home Guide An implanted port is a type of central line that is placed under the skin. Central lines are used to provide IV access when treatment or nutrition needs to be given through a person's veins. Implanted ports are used for long-term IV access. An implanted port may be placed because:  You need IV medicine that would be irritating to the small veins in your hands or arms.  You need long-term IV medicines, such as antibiotics.  You need IV nutrition for a long period.  You need frequent blood draws for lab tests.  You need dialysis.  Implanted ports are usually placed in the chest area, but they can also be placed in the upper arm, the abdomen, or the leg. An implanted port has two main parts:  Reservoir. The reservoir is round and will appear as a small, raised area under your skin. The reservoir is the part where a needle is inserted to give medicines or draw blood.  Catheter. The catheter is a thin, flexible tube that extends from the reservoir. The catheter is placed into a large vein. Medicine that is inserted into the reservoir goes into the catheter and then into the vein.  How will I care for my incision site? Do not get the incision site wet. Bathe or shower as directed by your health care provider. How is my port accessed? Special steps must be taken to access the port:  Before the port is accessed, a numbing cream can be placed on the skin. This helps numb the skin over the port site.  Your health care provider uses a sterile technique to access the port. ? Your health care provider must put on a mask and sterile gloves. ? The skin over your port is cleaned carefully with an antiseptic and allowed to dry. ? The port is gently pinched between sterile gloves, and a needle is inserted into the port.  Only "non-coring" port needles should be used to access the port. Once the port is accessed, a blood return should be checked. This helps ensure that the port  is in the vein and is not clogged.  If your port needs to remain accessed for a constant infusion, a clear (transparent) bandage will be placed over the needle site. The bandage and needle will need to be changed every week, or as directed by your health care provider.  Keep the bandage covering the needle clean and dry. Do not get it wet. Follow your health care provider's instructions on how to take a shower or bath while the port is accessed.  If your port does not need to stay accessed, no bandage is needed over the port.  What is flushing? Flushing helps keep the port from getting clogged. Follow your health care provider's instructions on how and when to flush the port. Ports are usually flushed with saline solution or a medicine called heparin. The need for flushing will depend on how the port is used.  If the port is used for intermittent medicines or blood draws, the port will need to be flushed: ? After medicines have been given. ? After blood has been drawn. ? As part of routine maintenance.  If a constant infusion is running, the port may not need to be flushed.  How long will my port stay implanted? The port can stay in for as long as your health care provider thinks it is needed. When it is time for the port to come out, surgery will be   done to remove it. The procedure is similar to the one performed when the port was put in. When should I seek immediate medical care? When you have an implanted port, you should seek immediate medical care if:  You notice a bad smell coming from the incision site.  You have swelling, redness, or drainage at the incision site.  You have more swelling or pain at the port site or the surrounding area.  You have a fever that is not controlled with medicine.  This information is not intended to replace advice given to you by your health care provider. Make sure you discuss any questions you have with your health care provider. Document  Released: 12/25/2004 Document Revised: 06/02/2015 Document Reviewed: 09/01/2012 Elsevier Interactive Patient Education  2017 [employer] Inc[employer]  

## 2017-10-10 NOTE — Progress Notes (Signed)
  Radiation Oncology         [phone] ________________________________  Name: Charles [name] MRN: [id number]  Date: 10/09/2017  DOB: [birth year]   SIMULATION AND TREATMENT PLANNING NOTE  DIAGNOSIS:     ICD-10-CM   1. Rectal cancer ([place]) C20      The patient presented for simulation for the patient's upcoming course of radiation for the diagnosis of rectal cancer. The patient was placed in a supine position. A customized vac-lock bag was constructed to aid in patient immobilization on. This complex treatment device will be used on a daily basis during the treatment. In this fashion a CT scan was obtained through the pelvic region and the isocenter was placed near midline within the pelvis. Surface markings were placed.  The patient's imaging was loaded into the radiation treatment planning system. The patient will initially be planned to receive a course of radiation to a dose of 45 Gy. This will be accomplished in 25 fractions at 1.8 gray per fraction. This initial treatment will correspond to a 3-D conformal technique. The target has been contoured in addition to the rectum, bladder and femoral heads. Dose volume histograms of each of these structures have been requested and these will be carefully reviewed as part of the 3-D conformal treatment planning process. To accomplish this initial treatment, 4 customized blocks have been designed for this purpose. Each of these 4 complex treatment devices will be used on a daily basis during the initial course of the treatment. It is anticipated that the patient will then receive a boost for an additional 5.4 Gy. The anticipated total dose therefore will be 50.4 Gy.    Special treatment procedure The patient will receive chemotherapy during the course of radiation treatment. The patient may experience increased or overlapping toxicity due to this combined-modality approach and the patient will be monitored for such problems. This may include extra  lab work as necessary. This therefore constitutes a special treatment procedure.    ________________________________  Jodelle Gross, MD, PhD

## 2017-10-10 NOTE — Telephone Encounter (Signed)
Pt declined avs and calendar  °

## 2017-10-11 ENCOUNTER — Telehealth: Payer: Self-pay

## 2017-10-11 ENCOUNTER — Telehealth: Payer: Self-pay | Admitting: Pharmacist

## 2017-10-11 DIAGNOSIS — C20 Malignant neoplasm of rectum: Secondary | ICD-10-CM

## 2017-10-11 MED ORDER — CAPECITABINE 500 MG PO TABS: ORAL_TABLET | ORAL | 0 refills | Status: DC

## 2017-10-11 NOTE — Telephone Encounter (Signed)
Oral Chemotherapy Pharmacist Encounter   I spoke with patient for overview of: Xeloda  Counseled patient on administration, dosing, side effects, monitoring, drug-food interactions, safe handling, storage, and disposal.  Patient will take Xeloda 500mg  tablets, 4 tablets (2000mg ) by mouth in AM and 3 tabs (1500mg ) by mouth in PM, within 30 minutes of finishing meals, on days of radiation only.  Xeloda and radiation start date: 10/14/2017  Adverse effects of Xeloda include but are not limited to: fatigue, decreased blood counts, GI upset, diarrhea, and hand-foot syndrome.  Patient has anti-emetic on hand and knows to take it if nausea develops.   Patient will obtain anti diarrheal and alert the office of 4 or more loose stools above baseline.   Reviewed with patient importance of keeping a medication schedule and plan for any missed doses.  Charles [name] voiced understanding and appreciation.   All questions answered.  Medication reconciliation performed and medication/allergy list updated.  Insurance authorization for Xeloda has been approved. [facility] per insurance requirement. Xeloda prescription has been E scrib[facility]. Patient informed of above information and provided phone number to [facility] [phone]). Patient instructed to contact [facility] on Monday (10/14/2017) by mid day if he has not yet heard from them to schedule delivery of his first shipment of the Xeloda.  Patient informed he will likely only receive enough pills for 22 treatment days open (30 calendar days) with the first fill per insurance requirement. The pharmacy will discuss refill procedure to receive remaining tablets for his full course of radiation therapy.  Patient knows to call the office with questions or concerns. Oral Oncology Clinic will continue to follow.  Charles [name], PharmD, BCPS, BCOP  10/11/2017  3:55 PM Oral  Oncology Clinic [phone]

## 2017-10-11 NOTE — Telephone Encounter (Signed)
Oral Oncology Pharmacist Encounter  Received new prescription for Xeloda (capecitabine) for the continued neoadjuvant treatment of stage III rectal cancer in conjunction with radiation, planned duration 5 1/2 weeks of radiation (28 total treatment days).  Patient has completed 8 cycles of neoadjuvant FOLFOX and is now planned for neoadjuvant concurrent chemoradiation course Radiation is scheduled 10/14/2017-11/20/2017  Xeloda will be dosed at ~825 mg/m2 BID on radiation days only  Labs from 10/10/2017 assessed, OK for treatment.  Current medication list in Epic reviewed, no significant DDIs with Xeloda identified.  Prescription has been e-scribed to the [facility] for benefits analysis and approval.  Oral Oncology Clinic will continue to follow for insurance authorization, copayment issues, initial counseling and start date.  Johny Drilling, PharmD, BCPS, BCOP  10/11/2017 8:33 AM Oral Oncology Clinic [phone]

## 2017-10-11 NOTE — Telephone Encounter (Signed)
Oral Oncology Patient Advocate Encounter  Prior Authorization for Xeloda has been approved.    PA# [id number] Effective dates: 10/11/17 through 10/12/18  Oral Oncology Clinic will continue to follow.   Charles [name] Patient [place] Phone [phone] Fax [phone]

## 2017-10-11 NOTE — Telephone Encounter (Signed)
Oral Oncology Patient Advocate Encounter  Received notification from [facility] that prior authorization for Xeloda is required.  PA submitted on CoverMyMeds Key [id number] Status is pending  Oral Oncology Clinic will continue to follow.  [place] Patient [place] Phone [phone] Fax [phone]

## 2017-10-14 ENCOUNTER — Ambulatory Visit: Payer: 59 | Admitting: Radiation Oncology

## 2017-10-14 NOTE — Telephone Encounter (Signed)
Oral Oncology Pharmacist Encounter  Received call from patient that he had reached out to [facility] this morning to check on status of his Xeloda and was told that they had not yet received a prescription from the office.  I called [facility] at [phone] to check on status of patient Xeloda prescription.  Per representative, they have received the prescription, she was able to run a test claim and get a paid claim. Representative states that prescription is just waiting on pharmacist verification prior to being able to reach out to patient to schedule delivery of the Xeloda.  I called and updated patient with above information. Patient stated that they have moved back his start date of concurrent chemoradiation to Wednesday (10/16/2017) in order to ensure that he is able to start Xeloda at the same time as he starts his radiation treatments.  Patient expressed understanding and appreciation. Patient knows to call the office with any additional questions or concerns.  Johny Drilling, PharmD, BCPS, BCOP  10/14/2017 11:02 AM Oral Oncology Clinic [phone]

## 2017-10-15 ENCOUNTER — Ambulatory Visit: Payer: 59

## 2017-10-15 DIAGNOSIS — C20 Malignant neoplasm of rectum: Secondary | ICD-10-CM | POA: Diagnosis not present

## 2017-10-16 ENCOUNTER — Ambulatory Visit
Admission: RE | Admit: 2017-10-16 | Discharge: 2017-10-16 | Disposition: A | Discharge status: Home / Self Care | Payer: 59 | Source: Ambulatory Visit | Attending: Radiation Oncology | Admitting: Radiation Oncology

## 2017-10-16 DIAGNOSIS — C20 Malignant neoplasm of rectum: Secondary | ICD-10-CM | POA: Diagnosis not present

## 2017-10-16 DIAGNOSIS — Z510 Encounter for antineoplastic radiation therapy: Secondary | ICD-10-CM | POA: Diagnosis not present

## 2017-10-16 NOTE — Progress Notes (Signed)
Pt here for patient teaching.  Pt given Radiation and You booklet.  Reviewed areas of pertinence such as diarrhea, fatigue, hair loss, sexual and fertility changes, skin changes and urinary and bladder changes . Pt able to give teach back of to pat skin, use unscented/gentle soap, use baby wipes and have Imodium on hand,avoid applying anything to skin within 4 hours of treatment. Pt verbalizes understanding of information given and will contact nursing with any questions or concerns.     Sebastion Jun M. Cohan Stipes RN, BSN      

## 2017-10-17 ENCOUNTER — Ambulatory Visit
Admission: RE | Admit: 2017-10-17 | Discharge: 2017-10-17 | Disposition: A | Discharge status: Home / Self Care | Payer: 59 | Source: Ambulatory Visit | Attending: Radiation Oncology | Admitting: Radiation Oncology

## 2017-10-17 DIAGNOSIS — Z510 Encounter for antineoplastic radiation therapy: Secondary | ICD-10-CM | POA: Diagnosis not present

## 2017-10-17 DIAGNOSIS — C20 Malignant neoplasm of rectum: Secondary | ICD-10-CM | POA: Diagnosis not present

## 2017-10-17 NOTE — Telephone Encounter (Signed)
Oral Oncology Patient Advocate Encounter  Confirmed with [facility] that Xeloda was delivered to the patient 10/15/17.  [place] Patient [place] Phone [phone] Fax [phone]

## 2017-10-18 ENCOUNTER — Ambulatory Visit
Admission: RE | Admit: 2017-10-18 | Discharge: 2017-10-18 | Disposition: A | Discharge status: Home / Self Care | Payer: 59 | Source: Ambulatory Visit | Attending: Radiation Oncology | Admitting: Radiation Oncology

## 2017-10-18 DIAGNOSIS — Z510 Encounter for antineoplastic radiation therapy: Secondary | ICD-10-CM | POA: Diagnosis not present

## 2017-10-18 DIAGNOSIS — C20 Malignant neoplasm of rectum: Secondary | ICD-10-CM | POA: Diagnosis not present

## 2017-10-21 ENCOUNTER — Ambulatory Visit
Admission: RE | Admit: 2017-10-21 | Discharge: 2017-10-21 | Disposition: A | Discharge status: Home / Self Care | Payer: 59 | Source: Ambulatory Visit | Attending: Radiation Oncology | Admitting: Radiation Oncology

## 2017-10-21 DIAGNOSIS — Z510 Encounter for antineoplastic radiation therapy: Secondary | ICD-10-CM | POA: Diagnosis not present

## 2017-10-21 DIAGNOSIS — C20 Malignant neoplasm of rectum: Secondary | ICD-10-CM | POA: Diagnosis not present

## 2017-10-22 ENCOUNTER — Ambulatory Visit
Admission: RE | Admit: 2017-10-22 | Discharge: 2017-10-22 | Disposition: A | Discharge status: Home / Self Care | Payer: 59 | Source: Ambulatory Visit | Attending: Radiation Oncology | Admitting: Radiation Oncology

## 2017-10-22 DIAGNOSIS — C20 Malignant neoplasm of rectum: Secondary | ICD-10-CM | POA: Diagnosis not present

## 2017-10-22 DIAGNOSIS — Z510 Encounter for antineoplastic radiation therapy: Secondary | ICD-10-CM | POA: Diagnosis not present

## 2017-10-23 ENCOUNTER — Ambulatory Visit
Admission: RE | Admit: 2017-10-23 | Discharge: 2017-10-23 | Disposition: A | Discharge status: Home / Self Care | Payer: 59 | Source: Ambulatory Visit | Attending: Radiation Oncology | Admitting: Radiation Oncology

## 2017-10-23 DIAGNOSIS — C20 Malignant neoplasm of rectum: Secondary | ICD-10-CM | POA: Diagnosis not present

## 2017-10-23 DIAGNOSIS — Z510 Encounter for antineoplastic radiation therapy: Secondary | ICD-10-CM | POA: Diagnosis not present

## 2017-10-24 ENCOUNTER — Ambulatory Visit
Admission: RE | Admit: 2017-10-24 | Discharge: 2017-10-24 | Disposition: A | Discharge status: Home / Self Care | Payer: 59 | Source: Ambulatory Visit | Attending: Radiation Oncology | Admitting: Radiation Oncology

## 2017-10-24 DIAGNOSIS — Z510 Encounter for antineoplastic radiation therapy: Secondary | ICD-10-CM | POA: Diagnosis not present

## 2017-10-24 DIAGNOSIS — C20 Malignant neoplasm of rectum: Secondary | ICD-10-CM | POA: Diagnosis not present

## 2017-10-25 ENCOUNTER — Ambulatory Visit
Admission: RE | Admit: 2017-10-25 | Discharge: 2017-10-25 | Disposition: A | Discharge status: Home / Self Care | Payer: 59 | Source: Ambulatory Visit | Attending: Radiation Oncology | Admitting: Radiation Oncology

## 2017-10-25 DIAGNOSIS — R531 Weakness: Secondary | ICD-10-CM | POA: Diagnosis not present

## 2017-10-25 DIAGNOSIS — C20 Malignant neoplasm of rectum: Secondary | ICD-10-CM | POA: Diagnosis not present

## 2017-10-25 DIAGNOSIS — R200 Anesthesia of skin: Secondary | ICD-10-CM | POA: Diagnosis not present

## 2017-10-25 DIAGNOSIS — E1142 Type 2 diabetes mellitus with diabetic polyneuropathy: Secondary | ICD-10-CM | POA: Diagnosis not present

## 2017-10-25 DIAGNOSIS — Z510 Encounter for antineoplastic radiation therapy: Secondary | ICD-10-CM | POA: Diagnosis not present

## 2017-10-25 DIAGNOSIS — E1165 Type 2 diabetes mellitus with hyperglycemia: Secondary | ICD-10-CM | POA: Diagnosis not present

## 2017-10-25 DIAGNOSIS — Z794 Long term (current) use of insulin: Secondary | ICD-10-CM | POA: Diagnosis not present

## 2017-10-25 DIAGNOSIS — E119 Type 2 diabetes mellitus without complications: Secondary | ICD-10-CM | POA: Diagnosis not present

## 2017-10-28 ENCOUNTER — Ambulatory Visit
Admission: RE | Admit: 2017-10-28 | Discharge: 2017-10-28 | Disposition: A | Discharge status: Home / Self Care | Payer: 59 | Source: Ambulatory Visit | Attending: Radiation Oncology | Admitting: Radiation Oncology

## 2017-10-28 ENCOUNTER — Inpatient Hospital Stay: Payer: 59

## 2017-10-28 ENCOUNTER — Telehealth: Payer: Self-pay | Admitting: Oncology

## 2017-10-28 ENCOUNTER — Inpatient Hospital Stay (HOSPITAL_BASED_OUTPATIENT_CLINIC_OR_DEPARTMENT_OTHER): Payer: 59 | Admitting: Oncology

## 2017-10-28 VITALS — BP 133/76 | HR 115 | Temp 98.4°F | Resp 19 | Ht 68.0 in | Wt 207.8 lb

## 2017-10-28 DIAGNOSIS — R200 Anesthesia of skin: Secondary | ICD-10-CM

## 2017-10-28 DIAGNOSIS — Z95828 Presence of other vascular implants and grafts: Secondary | ICD-10-CM

## 2017-10-28 DIAGNOSIS — Z794 Long term (current) use of insulin: Secondary | ICD-10-CM | POA: Diagnosis not present

## 2017-10-28 DIAGNOSIS — C20 Malignant neoplasm of rectum: Secondary | ICD-10-CM

## 2017-10-28 DIAGNOSIS — E1142 Type 2 diabetes mellitus with diabetic polyneuropathy: Secondary | ICD-10-CM | POA: Diagnosis not present

## 2017-10-28 DIAGNOSIS — E119 Type 2 diabetes mellitus without complications: Secondary | ICD-10-CM

## 2017-10-28 DIAGNOSIS — R531 Weakness: Secondary | ICD-10-CM | POA: Diagnosis not present

## 2017-10-28 DIAGNOSIS — Z510 Encounter for antineoplastic radiation therapy: Secondary | ICD-10-CM | POA: Diagnosis not present

## 2017-10-28 DIAGNOSIS — E1165 Type 2 diabetes mellitus with hyperglycemia: Secondary | ICD-10-CM | POA: Diagnosis not present

## 2017-10-28 LAB — CMP (CANCER CENTER ONLY)
ALBUMIN: 3.8 g/dL (ref 3.5–5.0)
ALK PHOS: 131 U/L — AB (ref 38–126)
ALT: 37 U/L (ref 0–44)
AST: 30 U/L (ref 15–41)
Anion gap: 10 (ref 5–15)
BUN: 12 mg/dL (ref 6–20)
CALCIUM: 9.8 mg/dL (ref 8.9–10.3)
CO2: 24 mmol/L (ref 22–32)
CREATININE: 0.95 mg/dL (ref 0.61–1.24)
Chloride: 104 mmol/L (ref 98–111)
GFR, Est AFR Am: >60 mL/min (ref >60)
GFR, Estimated: >60 mL/min (ref >60)
GLUCOSE: 165 mg/dL — AB (ref 70–99)
Potassium: 3.8 mmol/L (ref 3.5–5.1)
SODIUM: 138 mmol/L (ref 135–145)
Total Bilirubin: 0.6 mg/dL (ref 0.3–1.2)
Total Protein: 7.5 g/dL (ref 6.5–8.1)

## 2017-10-28 LAB — CBC WITH DIFFERENTIAL (CANCER CENTER ONLY)
Abs Immature Granulocytes: 0.08 10*3/uL — ABNORMAL HIGH (ref 0.00–0.07)
Basophils Absolute: 0 10*3/uL (ref 0.0–0.1)
Basophils Relative: 0 %
EOS ABS: 0 10*3/uL (ref 0.0–0.5)
Eosinophils Relative: 1 %
HEMATOCRIT: 37.4 % — AB (ref 39.0–52.0)
HEMOGLOBIN: 13 g/dL (ref 13.0–17.0)
Immature Granulocytes: 2 %
LYMPHS ABS: 0.8 10*3/uL (ref 0.7–4.0)
LYMPHS PCT: 16 %
MCH: 32.6 pg (ref 26.0–34.0)
MCHC: 34.8 g/dL (ref 30.0–36.0)
MCV: 93.7 fL (ref 80.0–100.0)
MONO ABS: 0.3 10*3/uL (ref 0.1–1.0)
MONOS PCT: 6 %
Neutro Abs: 3.6 10*3/uL (ref 1.7–7.7)
Neutrophils Relative %: 75 %
Platelet Count: 164 10*3/uL (ref 150–400)
RBC: 3.99 MIL/uL — ABNORMAL LOW (ref 4.22–5.81)
RDW: 14.3 % (ref 11.5–15.5)
WBC Count: 4.8 10*3/uL (ref 4.0–10.5)
nRBC: 0.4 % — ABNORMAL HIGH (ref 0.0–0.2)

## 2017-10-28 MED ORDER — HEPARIN SOD (PORK) LOCK FLUSH 100 UNIT/ML IV SOLN
500.0000 [IU] | Freq: Once | INTRAVENOUS | Status: AC | PRN
  Administered 2017-10-28: 500 [IU]
  Filled 2017-10-28: qty 5

## 2017-10-28 MED ORDER — SODIUM CHLORIDE 0.9% FLUSH
10.0000 mL | INTRAVENOUS | Status: DC | PRN
  Administered 2017-10-28: 10 mL
  Filled 2017-10-28: qty 10

## 2017-10-28 NOTE — Progress Notes (Signed)
  [place] OFFICE PROGRESS NOTE   Diagnosis: Rectal cancer  INTERVAL HISTORY:   Mr. Comunale returns as scheduled.  He began concurrent Xeloda and radiation on 10/16/2017.  He is tolerating the treatment well.  No mouth sores or diarrhea.  He continues to have numbness in the extremities.  His legs are weak at times.  He developed soreness in the right greater than left leg after putting air in his car tires.  No leg swelling.  Objective:  Vital signs in last 24 hours:  Blood pressure 133/76, pulse (!) 115, temperature 98.4 F (36.9 C), temperature source Oral, resp. rate 19, height 5\' 8"  (1.727 m), weight 207 lb 12.8 oz (94.3 kg), SpO2 100 %.    HEENT: No thrush or ulcers Resp: Lungs clear bilaterally Cardio: Regular rate and rhythm GI: No hepatosplenomegaly, no mass, nontender Vascular: No leg edema Skin: Mild skin thickening and hyperpigmentation of the hands and feet, no erythema or skin breakdown Musculoskeletal: No tenderness or swelling at the right thigh  Portacath/PICC-without erythema  Lab Results:  Lab Results  Component Value Date   WBC 4.8 10/28/2017   HGB 13.0 10/28/2017   HCT 37.4 (L) 10/28/2017   MCV 93.7 10/28/2017   PLT 164 10/28/2017   NEUTROABS 3.6 10/28/2017    CMP  Lab Results  Component Value Date   NA 138 10/28/2017   K 3.8 10/28/2017   CL 104 10/28/2017   CO2 24 10/28/2017   GLUCOSE 165 (H) 10/28/2017   BUN 12 10/28/2017   CREATININE 0.95 10/28/2017   CALCIUM 9.8 10/28/2017   PROT 7.5 10/28/2017   ALBUMIN 3.8 10/28/2017   AST 30 10/28/2017   ALT 37 10/28/2017   ALKPHOS 131 (H) 10/28/2017   BILITOT 0.6 10/28/2017   GFRNONAA >60 10/28/2017   GFRAA >60 10/28/2017     Medications: I have reviewed the patient's current medications.   Assessment/Plan: 1. Rectal cancer, clinical stage III ? Colonoscopy 05/24/2017, mass at 12 cm from the anal verge, biopsy confirmed invasive poorly differentiated adenocarcinoma ? CTs  06/06/2017-rectal mass, perirectal adenopathy, no evidence of metastatic disease, nonspecific 1 cm sclerotic lesion at the right iliac crest ? MRI 06/06/2017, T3cN1tumor measured at 5.3 cm from the anal sphincter ? Cycle 1 FOLFOX 06/20/2017 ? Cycle 2 FOLFOX 07/04/2017 ? Cycle 3 FOLFOX 07/17/2017 ? Cycle 4 FOLFOX 08/01/2017 (Emend added secondary to prolonged nausea following chemotherapy) ? Cycle 5 FOLFOX 08/15/2017 ? Cycle 6 FOLFOX 08/29/2017 ? Cycle 7 FOLFOX 09/12/2017 ? Cycle 8 FOLFOX 09/26/2017 (dose reduced due to neuropathy and thrombocytopenia) ? Initiation of radiation and capecitabine 10/16/2017  2. Diabetes 3. Glaucoma 4. Cecal polyp, tubular adenoma noted on the colonoscopy 05/24/2017 5. Port-A-Cath placement,Dr. White,06/17/2017 6. Oxaliplatin     Disposition: Mr. Fera appears to be tolerating the Xeloda and radiation well.  He will continue the current treatment.  He will return for an office and lab visit in 2 weeks.  Hopefully the oxalic platinum neuropathy symptoms will improve over the next few months.  15 minutes were spent with the patient today.  The majority of the time was used for counseling and coordination of care.  Betsy Coder, MD  10/28/2017  4:02 PM

## 2017-10-28 NOTE — Telephone Encounter (Signed)
Scheduled appt per 10/21 los - pt is aware of appt date and time.

## 2017-10-29 ENCOUNTER — Ambulatory Visit
Admission: RE | Admit: 2017-10-29 | Discharge: 2017-10-29 | Disposition: A | Discharge status: Home / Self Care | Payer: 59 | Source: Ambulatory Visit | Attending: Radiation Oncology | Admitting: Radiation Oncology

## 2017-10-29 DIAGNOSIS — Z510 Encounter for antineoplastic radiation therapy: Secondary | ICD-10-CM | POA: Diagnosis not present

## 2017-10-29 DIAGNOSIS — C20 Malignant neoplasm of rectum: Secondary | ICD-10-CM | POA: Diagnosis not present

## 2017-10-30 ENCOUNTER — Ambulatory Visit
Admission: RE | Admit: 2017-10-30 | Discharge: 2017-10-30 | Disposition: A | Discharge status: Home / Self Care | Payer: 59 | Source: Ambulatory Visit | Attending: Radiation Oncology | Admitting: Radiation Oncology

## 2017-10-30 DIAGNOSIS — Z510 Encounter for antineoplastic radiation therapy: Secondary | ICD-10-CM | POA: Diagnosis not present

## 2017-10-30 DIAGNOSIS — C20 Malignant neoplasm of rectum: Secondary | ICD-10-CM | POA: Diagnosis not present

## 2017-10-31 ENCOUNTER — Ambulatory Visit
Admission: RE | Admit: 2017-10-31 | Discharge: 2017-10-31 | Disposition: A | Discharge status: Home / Self Care | Payer: 59 | Source: Ambulatory Visit | Attending: Radiation Oncology | Admitting: Radiation Oncology

## 2017-10-31 DIAGNOSIS — C20 Malignant neoplasm of rectum: Secondary | ICD-10-CM | POA: Diagnosis not present

## 2017-10-31 DIAGNOSIS — Z510 Encounter for antineoplastic radiation therapy: Secondary | ICD-10-CM | POA: Diagnosis not present

## 2017-11-01 ENCOUNTER — Ambulatory Visit
Admission: RE | Admit: 2017-11-01 | Discharge: 2017-11-01 | Disposition: A | Discharge status: Home / Self Care | Payer: 59 | Source: Ambulatory Visit | Attending: Radiation Oncology | Admitting: Radiation Oncology

## 2017-11-01 DIAGNOSIS — Z510 Encounter for antineoplastic radiation therapy: Secondary | ICD-10-CM | POA: Diagnosis not present

## 2017-11-01 DIAGNOSIS — C20 Malignant neoplasm of rectum: Secondary | ICD-10-CM | POA: Diagnosis not present

## 2017-11-04 ENCOUNTER — Ambulatory Visit
Admission: RE | Admit: 2017-11-04 | Discharge: 2017-11-04 | Disposition: A | Discharge status: Home / Self Care | Payer: 59 | Source: Ambulatory Visit | Attending: Radiation Oncology | Admitting: Radiation Oncology

## 2017-11-04 DIAGNOSIS — C20 Malignant neoplasm of rectum: Secondary | ICD-10-CM | POA: Diagnosis not present

## 2017-11-04 DIAGNOSIS — Z510 Encounter for antineoplastic radiation therapy: Secondary | ICD-10-CM | POA: Diagnosis not present

## 2017-11-05 ENCOUNTER — Ambulatory Visit
Admission: RE | Admit: 2017-11-05 | Discharge: 2017-11-05 | Disposition: A | Discharge status: Home / Self Care | Payer: 59 | Source: Ambulatory Visit | Attending: Radiation Oncology | Admitting: Radiation Oncology

## 2017-11-05 DIAGNOSIS — Z510 Encounter for antineoplastic radiation therapy: Secondary | ICD-10-CM | POA: Diagnosis not present

## 2017-11-05 DIAGNOSIS — C20 Malignant neoplasm of rectum: Secondary | ICD-10-CM | POA: Diagnosis not present

## 2017-11-06 ENCOUNTER — Ambulatory Visit
Admission: RE | Admit: 2017-11-06 | Discharge: 2017-11-06 | Disposition: A | Discharge status: Home / Self Care | Payer: 59 | Source: Ambulatory Visit | Attending: Radiation Oncology | Admitting: Radiation Oncology

## 2017-11-06 DIAGNOSIS — Z510 Encounter for antineoplastic radiation therapy: Secondary | ICD-10-CM | POA: Diagnosis not present

## 2017-11-06 DIAGNOSIS — C20 Malignant neoplasm of rectum: Secondary | ICD-10-CM | POA: Diagnosis not present

## 2017-11-07 ENCOUNTER — Ambulatory Visit
Admission: RE | Admit: 2017-11-07 | Discharge: 2017-11-07 | Disposition: A | Discharge status: Home / Self Care | Payer: 59 | Source: Ambulatory Visit | Attending: Radiation Oncology | Admitting: Radiation Oncology

## 2017-11-07 DIAGNOSIS — Z510 Encounter for antineoplastic radiation therapy: Secondary | ICD-10-CM | POA: Diagnosis not present

## 2017-11-07 DIAGNOSIS — C20 Malignant neoplasm of rectum: Secondary | ICD-10-CM | POA: Diagnosis not present

## 2017-11-08 ENCOUNTER — Ambulatory Visit
Admission: RE | Admit: 2017-11-08 | Discharge: 2017-11-08 | Disposition: A | Discharge status: Home / Self Care | Payer: 59 | Source: Ambulatory Visit | Attending: Radiation Oncology | Admitting: Radiation Oncology

## 2017-11-08 DIAGNOSIS — C20 Malignant neoplasm of rectum: Secondary | ICD-10-CM | POA: Diagnosis not present

## 2017-11-08 DIAGNOSIS — Z510 Encounter for antineoplastic radiation therapy: Secondary | ICD-10-CM | POA: Diagnosis not present

## 2017-11-11 ENCOUNTER — Telehealth: Payer: Self-pay | Admitting: Nurse Practitioner

## 2017-11-11 ENCOUNTER — Encounter: Payer: Self-pay | Admitting: Nurse Practitioner

## 2017-11-11 ENCOUNTER — Ambulatory Visit
Admission: RE | Admit: 2017-11-11 | Discharge: 2017-11-11 | Disposition: A | Discharge status: Home / Self Care | Payer: 59 | Source: Ambulatory Visit | Attending: Radiation Oncology | Admitting: Radiation Oncology

## 2017-11-11 ENCOUNTER — Inpatient Hospital Stay: Payer: 59

## 2017-11-11 ENCOUNTER — Inpatient Hospital Stay: Payer: 59 | Attending: Oncology | Admitting: Nurse Practitioner

## 2017-11-11 VITALS — BP 134/76 | HR 108 | Temp 98.0°F | Resp 18 | Ht 68.0 in | Wt 203.3 lb

## 2017-11-11 DIAGNOSIS — C20 Malignant neoplasm of rectum: Secondary | ICD-10-CM

## 2017-11-11 DIAGNOSIS — R194 Change in bowel habit: Secondary | ICD-10-CM | POA: Diagnosis not present

## 2017-11-11 DIAGNOSIS — Z510 Encounter for antineoplastic radiation therapy: Secondary | ICD-10-CM | POA: Diagnosis not present

## 2017-11-11 DIAGNOSIS — L819 Disorder of pigmentation, unspecified: Secondary | ICD-10-CM | POA: Insufficient documentation

## 2017-11-11 DIAGNOSIS — E1140 Type 2 diabetes mellitus with diabetic neuropathy, unspecified: Secondary | ICD-10-CM | POA: Insufficient documentation

## 2017-11-11 DIAGNOSIS — E119 Type 2 diabetes mellitus without complications: Secondary | ICD-10-CM | POA: Diagnosis not present

## 2017-11-11 DIAGNOSIS — Z95828 Presence of other vascular implants and grafts: Secondary | ICD-10-CM

## 2017-11-11 LAB — CBC WITH DIFFERENTIAL (CANCER CENTER ONLY)
Abs Immature Granulocytes: 0.07 10*3/uL (ref 0.00–0.07)
BASOS PCT: 1 %
Basophils Absolute: 0 10*3/uL (ref 0.0–0.1)
EOS PCT: 4 %
Eosinophils Absolute: 0.2 10*3/uL (ref 0.0–0.5)
HCT: 35.3 % — ABNORMAL LOW (ref 39.0–52.0)
Hemoglobin: 12.2 g/dL — ABNORMAL LOW (ref 13.0–17.0)
Immature Granulocytes: 1 %
Lymphocytes Relative: 7 %
Lymphs Abs: 0.4 10*3/uL — ABNORMAL LOW (ref 0.7–4.0)
MCH: 33.2 pg (ref 26.0–34.0)
MCHC: 34.6 g/dL (ref 30.0–36.0)
MCV: 96.2 fL (ref 80.0–100.0)
MONO ABS: 0.4 10*3/uL (ref 0.1–1.0)
MONOS PCT: 7 %
NEUTROS PCT: 80 %
Neutro Abs: 4.5 10*3/uL (ref 1.7–7.7)
PLATELETS: 165 10*3/uL (ref 150–400)
RBC: 3.67 MIL/uL — ABNORMAL LOW (ref 4.22–5.81)
RDW: 14.4 % (ref 11.5–15.5)
WBC Count: 5.6 10*3/uL (ref 4.0–10.5)
nRBC: 0 % (ref 0.0–0.2)

## 2017-11-11 LAB — CMP (CANCER CENTER ONLY)
ALBUMIN: 3.6 g/dL (ref 3.5–5.0)
ALK PHOS: 118 U/L (ref 38–126)
ALT: 24 U/L (ref 0–44)
ANION GAP: 11 (ref 5–15)
AST: 21 U/L (ref 15–41)
BILIRUBIN TOTAL: 0.6 mg/dL (ref 0.3–1.2)
BUN: 11 mg/dL (ref 6–20)
CALCIUM: 9.6 mg/dL (ref 8.9–10.3)
CO2: 26 mmol/L (ref 22–32)
Chloride: 104 mmol/L (ref 98–111)
Creatinine: 0.94 mg/dL (ref 0.61–1.24)
GFR, Est AFR Am: >60 mL/min (ref >60)
GFR, Estimated: >60 mL/min (ref >60)
GLUCOSE: 236 mg/dL — AB (ref 70–99)
Potassium: 3.7 mmol/L (ref 3.5–5.1)
Sodium: 141 mmol/L (ref 135–145)
TOTAL PROTEIN: 7.3 g/dL (ref 6.5–8.1)

## 2017-11-11 MED ORDER — HEPARIN SOD (PORK) LOCK FLUSH 100 UNIT/ML IV SOLN
500.0000 [IU] | Freq: Once | INTRAVENOUS | Status: AC | PRN
  Administered 2017-11-11: 500 [IU]
  Filled 2017-11-11: qty 5

## 2017-11-11 MED ORDER — SODIUM CHLORIDE 0.9% FLUSH
10.0000 mL | INTRAVENOUS | Status: DC | PRN
  Administered 2017-11-11: 10 mL
  Filled 2017-11-11: qty 10

## 2017-11-11 NOTE — Telephone Encounter (Signed)
Scheduled appt per 1/14 los - pt is aware of appt date and time 

## 2017-11-11 NOTE — Progress Notes (Addendum)
  [place] OFFICE PROGRESS NOTE   Diagnosis: Rectal cancer  INTERVAL HISTORY:   Charles [name] returns as scheduled.  He continues Xeloda and radiation.  He denies nausea/vomiting.  No mouth sores.  He is having frequent small-volume loose stools with associated urgency.  No bleeding.  No hand or foot redness.  Hands feel "cold".  Objective:  Vital signs in last 24 hours:  Blood pressure 134/76, pulse (!) 108, temperature 98 F (36.7 C), temperature source Oral, resp. rate 18, height 5\' 8"  (1.727 m), weight 203 lb 4.8 oz (92.2 kg), SpO2 100 %.    HEENT: White coating over tongue. Resp: Lungs clear bilaterally. Cardio: Regular rate and rhythm. GI: Abdomen soft and nontender.  No hepatomegaly. Vascular: No leg edema.  Skin: Palms with hyperpigmentation, dryness. Port-A-Cath without erythema.   Lab Results:  Lab Results  Component Value Date   WBC 5.6 11/11/2017   HGB 12.2 (L) 11/11/2017   HCT 35.3 (L) 11/11/2017   MCV 96.2 11/11/2017   PLT 165 11/11/2017   NEUTROABS 4.5 11/11/2017    Imaging:  No results found.  Medications: I have reviewed the patient's current medications.  Assessment/Plan: 1. Rectal cancer, clinical stage III ? Colonoscopy 05/24/2017, mass at 12 cm from the anal verge, biopsy confirmed invasive poorly differentiated adenocarcinoma ? CTs 06/06/2017-rectal mass, perirectal adenopathy, no evidence of metastatic disease, nonspecific 1 cm sclerotic lesion at the right iliac crest ? MRI 06/06/2017, T3cN1tumor measured at 5.3 cm from the anal sphincter ? Cycle 1 FOLFOX 06/20/2017 ? Cycle 2 FOLFOX 07/04/2017 ? Cycle 3 FOLFOX 07/17/2017 ? Cycle 4 FOLFOX 08/01/2017 (Emend added secondary to prolonged nausea following chemotherapy) ? Cycle 5 FOLFOX 08/15/2017 ? Cycle 6 FOLFOX 08/29/2017 ? Cycle 7 FOLFOX 09/12/2017 ? Cycle 8 FOLFOX 09/26/2017 (dose reduced due to neuropathy and thrombocytopenia) ? Initiation of radiation and capecitabine  10/16/2017  2. Diabetes 3. Glaucoma 4. Cecal polyp, tubular adenoma noted on the colonoscopy 05/24/2017 5. Port-A-Cath placement,Dr. White,06/17/2017 6. Oxaliplatin neuropathy  Disposition: Charles [name] appears stable.  He continues Xeloda and radiation.  Anticipated completion date is 11/22/2017.  We reviewed the CBC from today.  Counts adequate to continue treatment as above.  He will return for lab and follow-up in approximately 3 weeks. He will contact the office in the interim with any problems.  Patient seen with Dr. Benay Spice.    [name] ANP/GNP-BC   11/11/2017  11:42 AM This was a shared visit with [name].  Charles [name] tolerating the Xeloda and radiation well.  He will return for an office visit after completing the current therapy.  Hopefully the neuropathy symptoms will improve over the next several months.  Julieanne Manson, MD

## 2017-11-12 ENCOUNTER — Ambulatory Visit
Admission: RE | Admit: 2017-11-12 | Discharge: 2017-11-12 | Disposition: A | Discharge status: Home / Self Care | Payer: 59 | Source: Ambulatory Visit | Attending: Radiation Oncology | Admitting: Radiation Oncology

## 2017-11-12 DIAGNOSIS — Z510 Encounter for antineoplastic radiation therapy: Secondary | ICD-10-CM | POA: Diagnosis not present

## 2017-11-12 DIAGNOSIS — C20 Malignant neoplasm of rectum: Secondary | ICD-10-CM | POA: Diagnosis not present

## 2017-11-13 ENCOUNTER — Ambulatory Visit
Admission: RE | Admit: 2017-11-13 | Discharge: 2017-11-13 | Disposition: A | Discharge status: Home / Self Care | Payer: 59 | Source: Ambulatory Visit | Attending: Radiation Oncology | Admitting: Radiation Oncology

## 2017-11-13 DIAGNOSIS — Z510 Encounter for antineoplastic radiation therapy: Secondary | ICD-10-CM | POA: Diagnosis not present

## 2017-11-13 DIAGNOSIS — C20 Malignant neoplasm of rectum: Secondary | ICD-10-CM | POA: Diagnosis not present

## 2017-11-14 ENCOUNTER — Ambulatory Visit
Admission: RE | Admit: 2017-11-14 | Discharge: 2017-11-14 | Disposition: A | Discharge status: Home / Self Care | Payer: 59 | Source: Ambulatory Visit | Attending: Radiation Oncology | Admitting: Radiation Oncology

## 2017-11-14 DIAGNOSIS — Z510 Encounter for antineoplastic radiation therapy: Secondary | ICD-10-CM | POA: Diagnosis not present

## 2017-11-14 DIAGNOSIS — C20 Malignant neoplasm of rectum: Secondary | ICD-10-CM | POA: Diagnosis not present

## 2017-11-15 ENCOUNTER — Ambulatory Visit
Admission: RE | Admit: 2017-11-15 | Discharge: 2017-11-15 | Disposition: A | Discharge status: Home / Self Care | Payer: 59 | Source: Ambulatory Visit | Attending: Radiation Oncology | Admitting: Radiation Oncology

## 2017-11-15 DIAGNOSIS — C20 Malignant neoplasm of rectum: Secondary | ICD-10-CM | POA: Diagnosis not present

## 2017-11-15 DIAGNOSIS — Z510 Encounter for antineoplastic radiation therapy: Secondary | ICD-10-CM | POA: Diagnosis not present

## 2017-11-18 ENCOUNTER — Ambulatory Visit
Admission: RE | Admit: 2017-11-18 | Discharge: 2017-11-18 | Disposition: A | Discharge status: Home / Self Care | Payer: 59 | Source: Ambulatory Visit | Attending: Radiation Oncology | Admitting: Radiation Oncology

## 2017-11-18 ENCOUNTER — Ambulatory Visit: Payer: 59

## 2017-11-18 DIAGNOSIS — C20 Malignant neoplasm of rectum: Secondary | ICD-10-CM | POA: Diagnosis not present

## 2017-11-18 DIAGNOSIS — Z510 Encounter for antineoplastic radiation therapy: Secondary | ICD-10-CM | POA: Diagnosis not present

## 2017-11-19 ENCOUNTER — Ambulatory Visit
Admission: RE | Admit: 2017-11-19 | Discharge: 2017-11-19 | Disposition: A | Discharge status: Home / Self Care | Payer: 59 | Source: Ambulatory Visit | Attending: Radiation Oncology | Admitting: Radiation Oncology

## 2017-11-19 ENCOUNTER — Ambulatory Visit: Payer: 59

## 2017-11-19 DIAGNOSIS — Z510 Encounter for antineoplastic radiation therapy: Secondary | ICD-10-CM | POA: Diagnosis not present

## 2017-11-19 DIAGNOSIS — C20 Malignant neoplasm of rectum: Secondary | ICD-10-CM | POA: Diagnosis not present

## 2017-11-20 ENCOUNTER — Ambulatory Visit
Admission: RE | Admit: 2017-11-20 | Discharge: 2017-11-20 | Disposition: A | Discharge status: Home / Self Care | Payer: 59 | Source: Ambulatory Visit | Attending: Radiation Oncology | Admitting: Radiation Oncology

## 2017-11-20 ENCOUNTER — Ambulatory Visit: Payer: 59

## 2017-11-20 DIAGNOSIS — C20 Malignant neoplasm of rectum: Secondary | ICD-10-CM | POA: Diagnosis not present

## 2017-11-20 DIAGNOSIS — Z510 Encounter for antineoplastic radiation therapy: Secondary | ICD-10-CM | POA: Diagnosis not present

## 2017-11-21 ENCOUNTER — Ambulatory Visit
Admission: RE | Admit: 2017-11-21 | Discharge: 2017-11-21 | Disposition: A | Discharge status: Home / Self Care | Payer: 59 | Source: Ambulatory Visit | Attending: Radiation Oncology | Admitting: Radiation Oncology

## 2017-11-21 DIAGNOSIS — L819 Disorder of pigmentation, unspecified: Secondary | ICD-10-CM | POA: Diagnosis not present

## 2017-11-21 DIAGNOSIS — Z510 Encounter for antineoplastic radiation therapy: Secondary | ICD-10-CM | POA: Diagnosis not present

## 2017-11-21 DIAGNOSIS — C20 Malignant neoplasm of rectum: Secondary | ICD-10-CM | POA: Diagnosis not present

## 2017-11-21 DIAGNOSIS — E1140 Type 2 diabetes mellitus with diabetic neuropathy, unspecified: Secondary | ICD-10-CM | POA: Diagnosis not present

## 2017-11-22 ENCOUNTER — Ambulatory Visit
Admission: RE | Admit: 2017-11-22 | Discharge: 2017-11-22 | Disposition: A | Discharge status: Home / Self Care | Payer: 59 | Source: Ambulatory Visit | Attending: Radiation Oncology | Admitting: Radiation Oncology

## 2017-11-22 ENCOUNTER — Encounter: Payer: Self-pay | Admitting: Radiation Oncology

## 2017-11-22 DIAGNOSIS — C20 Malignant neoplasm of rectum: Secondary | ICD-10-CM | POA: Diagnosis not present

## 2017-11-22 DIAGNOSIS — Z510 Encounter for antineoplastic radiation therapy: Secondary | ICD-10-CM | POA: Diagnosis not present

## 2017-11-22 DIAGNOSIS — E1140 Type 2 diabetes mellitus with diabetic neuropathy, unspecified: Secondary | ICD-10-CM | POA: Diagnosis not present

## 2017-11-22 DIAGNOSIS — L819 Disorder of pigmentation, unspecified: Secondary | ICD-10-CM | POA: Diagnosis not present

## 2017-11-25 DIAGNOSIS — E113293 Type 2 diabetes mellitus with mild nonproliferative diabetic retinopathy without macular edema, bilateral: Secondary | ICD-10-CM | POA: Diagnosis not present

## 2017-11-25 DIAGNOSIS — H401134 Primary open-angle glaucoma, bilateral, indeterminate stage: Secondary | ICD-10-CM | POA: Diagnosis not present

## 2017-11-25 DIAGNOSIS — H2513 Age-related nuclear cataract, bilateral: Secondary | ICD-10-CM | POA: Diagnosis not present

## 2017-11-25 DIAGNOSIS — H524 Presbyopia: Secondary | ICD-10-CM | POA: Diagnosis not present

## 2017-11-28 NOTE — Progress Notes (Signed)
Disability completed and successfully faxed to [employer] at [phone]. Mailed copy to patient address on file.

## 2017-12-02 DIAGNOSIS — C20 Malignant neoplasm of rectum: Secondary | ICD-10-CM | POA: Diagnosis not present

## 2017-12-03 ENCOUNTER — Inpatient Hospital Stay: Payer: 59

## 2017-12-03 ENCOUNTER — Inpatient Hospital Stay (HOSPITAL_BASED_OUTPATIENT_CLINIC_OR_DEPARTMENT_OTHER): Payer: 59 | Admitting: Oncology

## 2017-12-03 ENCOUNTER — Other Ambulatory Visit: Payer: Self-pay | Admitting: *Deleted

## 2017-12-03 ENCOUNTER — Other Ambulatory Visit: Payer: Self-pay | Admitting: Surgery

## 2017-12-03 VITALS — BP 115/75 | HR 106 | Temp 97.8°F | Resp 19 | Ht 68.0 in | Wt 199.2 lb

## 2017-12-03 DIAGNOSIS — Z95828 Presence of other vascular implants and grafts: Secondary | ICD-10-CM

## 2017-12-03 DIAGNOSIS — C20 Malignant neoplasm of rectum: Secondary | ICD-10-CM

## 2017-12-03 DIAGNOSIS — E1140 Type 2 diabetes mellitus with diabetic neuropathy, unspecified: Secondary | ICD-10-CM

## 2017-12-03 DIAGNOSIS — L819 Disorder of pigmentation, unspecified: Secondary | ICD-10-CM

## 2017-12-03 LAB — CBC WITH DIFFERENTIAL (CANCER CENTER ONLY)
Abs Immature Granulocytes: 0.02 10*3/uL (ref 0.00–0.07)
BASOS PCT: 1 %
Basophils Absolute: 0 10*3/uL (ref 0.0–0.1)
Eosinophils Absolute: 0.1 10*3/uL (ref 0.0–0.5)
Eosinophils Relative: 2 %
HEMATOCRIT: 35.1 % — AB (ref 39.0–52.0)
Hemoglobin: 12.2 g/dL — ABNORMAL LOW (ref 13.0–17.0)
Immature Granulocytes: 0 %
Lymphocytes Relative: 7 %
Lymphs Abs: 0.3 10*3/uL — ABNORMAL LOW (ref 0.7–4.0)
MCH: 33.7 pg (ref 26.0–34.0)
MCHC: 34.8 g/dL (ref 30.0–36.0)
MCV: 97 fL (ref 80.0–100.0)
MONO ABS: 0.4 10*3/uL (ref 0.1–1.0)
MONOS PCT: 8 %
NRBC: 0 % (ref 0.0–0.2)
Neutro Abs: 3.9 10*3/uL (ref 1.7–7.7)
Neutrophils Relative %: 82 %
Platelet Count: 128 10*3/uL — ABNORMAL LOW (ref 150–400)
RBC: 3.62 MIL/uL — ABNORMAL LOW (ref 4.22–5.81)
RDW: 14.9 % (ref 11.5–15.5)
WBC: 4.8 10*3/uL (ref 4.0–10.5)

## 2017-12-03 MED ORDER — GABAPENTIN 300 MG PO CAPS: 300.0000 mg | ORAL_CAPSULE | Freq: Two times a day (BID) | ORAL | 1 refills | Status: DC

## 2017-12-03 MED ORDER — SODIUM CHLORIDE 0.9% FLUSH
10.0000 mL | INTRAVENOUS | Status: DC | PRN
  Administered 2017-12-03: 10 mL
  Filled 2017-12-03: qty 10

## 2017-12-03 MED ORDER — HEPARIN SOD (PORK) LOCK FLUSH 100 UNIT/ML IV SOLN
500.0000 [IU] | Freq: Once | INTRAVENOUS | Status: AC | PRN
  Administered 2017-12-03: 500 [IU]
  Filled 2017-12-03: qty 5

## 2017-12-03 NOTE — Progress Notes (Signed)
[place] OFFICE PROGRESS NOTE   Diagnosis: Rectal cancer  INTERVAL HISTORY:   Mr. [place]rosse completed Xeloda and radiation on 11/22/2017.  He continues to feel "weak ".  There is persistent hyperpigmentation of the hands and feet.  His chief complaint is numbness in the hands.  He has cold sensitivity and discomfort in the hands.  He has mild numbness in the feet. He saw Dr. Dema Severin and has been scheduled for surgery in January.  Objective:  Vital signs in last 24 hours:  Blood pressure 115/75, pulse (!) 106, temperature 97.8 F (36.6 C), temperature source Oral, resp. rate 19, height 5\' 8"  (1.727 m), weight 199 lb 3.2 oz (90.4 kg), SpO2 99 %.    HEENT: Mild white coat over the tongue, no buccal thrush Resp: Lungs clear bilaterally Cardio: Regular rate and rhythm GI: No hepatosplenomegaly, nontender Vascular: No leg edema Neuro: Mild loss of vibratory sense at the fingertips bilaterally Skin: Skin thickening and hyperpigmentation of the hands and feet  Portacath/PICC-without erythema  Lab Results:  Lab Results  Component Value Date   WBC 4.8 12/03/2017   HGB 12.2 (L) 12/03/2017   HCT 35.1 (L) 12/03/2017   MCV 97.0 12/03/2017   PLT 128 (L) 12/03/2017   NEUTROABS 3.9 12/03/2017    CMP  Lab Results  Component Value Date   NA 141 11/11/2017   K 3.7 11/11/2017   CL 104 11/11/2017   CO2 26 11/11/2017   GLUCOSE 236 (H) 11/11/2017   BUN 11 11/11/2017   CREATININE 0.94 11/11/2017   CALCIUM 9.6 11/11/2017   PROT 7.3 11/11/2017   ALBUMIN 3.6 11/11/2017   AST 21 11/11/2017   ALT 24 11/11/2017   ALKPHOS 118 11/11/2017   BILITOT 0.6 11/11/2017   GFRNONAA >60 11/11/2017   GFRAA >60 11/11/2017     Medications: I have reviewed the patient's current medications.   Assessment/Plan: 1. Rectal cancer, clinical stage III ? Colonoscopy 05/24/2017, mass at 12 cm from the anal verge, biopsy confirmed invasive poorly differentiated adenocarcinoma ? CTs  06/06/2017-rectal mass, perirectal adenopathy, no evidence of metastatic disease, nonspecific 1 cm sclerotic lesion at the right iliac crest ? MRI 06/06/2017, T3cN1tumor measured at 5.3 cm from the anal sphincter ? Cycle 1 FOLFOX 06/20/2017 ? Cycle 2 FOLFOX 07/04/2017 ? Cycle 3 FOLFOX 07/17/2017 ? Cycle 4 FOLFOX 08/01/2017 (Emend added secondary to prolonged nausea following chemotherapy) ? Cycle 5 FOLFOX 08/15/2017 ? Cycle 6 FOLFOX 08/29/2017 ? Cycle 7 FOLFOX 09/12/2017 ? Cycle 8 FOLFOX 09/26/2017 (dose reduced due to neuropathy and thrombocytopenia) ? Initiation of radiation and capecitabine 10/16/2017, completed 11/22/2017  2. Diabetes 3. Glaucoma 4. Cecal polyp, tubular adenoma noted on the colonoscopy 05/24/2017 5. Port-A-Cath placement,Dr. White,06/17/2017 6. Oxaliplatin neuropathy    Disposition: Mr. Garber has completed the course of neoadjuvant therapy and he saw Dr. Dema Severin and has been scheduled for surgery 01/17/2017.  He has developed hyperpigmentation and skin thickening at the hands and feet.  This should improve over the next several weeks.  He has mild-moderate oxaliplatin neuropathy.  Hopefully the neuropathy symptoms will improve over the next several months.  He complains of pain in the fingers, especially with cold exposure.  He will begin a trial of gabapentin.  We reviewed potential toxicities so should we gabapentin and he agrees to proceed.  He will contact us within the next 2 weeks if the gabapentin does not help.  He will return for an office visit and Port-A-Cath flush 12/30/2017.  25 minutes were  spent with the patient today.  The majority of the time was used for counseling and coordination of care.  Betsy Coder, MD  12/03/2017  9:14 AM

## 2017-12-04 ENCOUNTER — Telehealth: Payer: Self-pay | Admitting: Oncology

## 2017-12-04 NOTE — Telephone Encounter (Signed)
Scheduled appt per 11/26 los - left message for patient with appt date and time  

## 2017-12-10 ENCOUNTER — Ambulatory Visit
Admission: RE | Admit: 2017-12-10 | Discharge: 2017-12-10 | Disposition: A | Discharge status: Home / Self Care | Payer: 59 | Source: Ambulatory Visit | Attending: Surgery | Admitting: Surgery

## 2017-12-10 DIAGNOSIS — C20 Malignant neoplasm of rectum: Secondary | ICD-10-CM | POA: Diagnosis not present

## 2017-12-10 MED ORDER — IOPAMIDOL (ISOVUE-300) INJECTION 61%
100.0000 mL | Freq: Once | INTRAVENOUS | Status: AC | PRN
  Administered 2017-12-10: 100 mL via INTRAVENOUS

## 2017-12-12 NOTE — Progress Notes (Signed)
  Radiation Oncology         [id number]) [phone] ________________________________  Name: Charles [name] MRN: [id number]  Date: 11/22/2017  DOB: March 03, 1965  End of Treatment Note  Diagnosis:   52 y.o. male with Stage IIIB, cT3N1M0 adenocarcinoma of the rectum     Indication for treatment:  Curative       Radiation treatment dates:   10/16/2017 - 11/22/2017  Site/dose:   The rectum was initially treated to 45 Gy in 25 fractions, followed by a 5.4 Gy boost delivered in 3 fractions, to yield a final total dose of 50.4 Gy.  Beams/energy:   3D / 15X, 10X Photon  Narrative: The patient tolerated radiation treatment relatively well.  He experienced mild fatigue and some itching to his rectal area without any significant skin irritation. He also noted loose stools, managed with Imodium; no bleeding.  Plan: The patient has completed radiation treatment. The patient will return to radiation oncology clinic for routine followup in one month. I advised them to call or return sooner if they have any questions or concerns related to their recovery or treatment.  ------------------------------------------------  Jodelle Gross, MD, PhD  This document serves as a record of services personally performed by Kyung Rudd, MD. It was created on his behalf by Rae Lips, a trained medical scribe. The creation of this record is based on the scribe's personal observations and the provider's statements to them. This document has been checked and approved by the attending provider.

## 2017-12-16 ENCOUNTER — Other Ambulatory Visit: Payer: Self-pay | Admitting: Urology

## 2017-12-27 DIAGNOSIS — Z794 Long term (current) use of insulin: Secondary | ICD-10-CM | POA: Diagnosis not present

## 2017-12-27 DIAGNOSIS — E1165 Type 2 diabetes mellitus with hyperglycemia: Secondary | ICD-10-CM | POA: Diagnosis not present

## 2017-12-27 DIAGNOSIS — E119 Type 2 diabetes mellitus without complications: Secondary | ICD-10-CM | POA: Diagnosis not present

## 2017-12-27 DIAGNOSIS — E1142 Type 2 diabetes mellitus with diabetic polyneuropathy: Secondary | ICD-10-CM | POA: Diagnosis not present

## 2017-12-30 ENCOUNTER — Inpatient Hospital Stay: Payer: 59 | Attending: Oncology | Admitting: Oncology

## 2017-12-30 ENCOUNTER — Ambulatory Visit
Admission: RE | Admit: 2017-12-30 | Discharge: 2017-12-30 | Disposition: A | Discharge status: Home / Self Care | Payer: 59 | Source: Ambulatory Visit | Attending: Radiation Oncology | Admitting: Radiation Oncology

## 2017-12-30 ENCOUNTER — Other Ambulatory Visit: Payer: Self-pay

## 2017-12-30 ENCOUNTER — Encounter: Payer: Self-pay | Admitting: Oncology

## 2017-12-30 ENCOUNTER — Encounter: Payer: Self-pay | Admitting: Radiation Oncology

## 2017-12-30 ENCOUNTER — Other Ambulatory Visit: Payer: Self-pay | Admitting: *Deleted

## 2017-12-30 ENCOUNTER — Telehealth: Payer: Self-pay | Admitting: Oncology

## 2017-12-30 VITALS — BP 140/75 | HR 104 | Temp 98.4°F | Resp 17 | Ht 68.0 in | Wt 202.5 lb

## 2017-12-30 VITALS — BP 123/74 | HR 108 | Temp 97.8°F | Resp 18 | Ht 68.0 in | Wt 202.4 lb

## 2017-12-30 DIAGNOSIS — C20 Malignant neoplasm of rectum: Secondary | ICD-10-CM | POA: Diagnosis not present

## 2017-12-30 DIAGNOSIS — M79675 Pain in left toe(s): Secondary | ICD-10-CM | POA: Diagnosis not present

## 2017-12-30 DIAGNOSIS — Z794 Long term (current) use of insulin: Secondary | ICD-10-CM | POA: Insufficient documentation

## 2017-12-30 DIAGNOSIS — T451X5A Adverse effect of antineoplastic and immunosuppressive drugs, initial encounter: Secondary | ICD-10-CM | POA: Insufficient documentation

## 2017-12-30 DIAGNOSIS — G620 Drug-induced polyneuropathy: Secondary | ICD-10-CM | POA: Diagnosis not present

## 2017-12-30 DIAGNOSIS — L602 Onychogryphosis: Secondary | ICD-10-CM | POA: Diagnosis not present

## 2017-12-30 DIAGNOSIS — E139 Other specified diabetes mellitus without complications: Secondary | ICD-10-CM | POA: Diagnosis not present

## 2017-12-30 DIAGNOSIS — Z923 Personal history of irradiation: Secondary | ICD-10-CM | POA: Insufficient documentation

## 2017-12-30 DIAGNOSIS — Z79899 Other long term (current) drug therapy: Secondary | ICD-10-CM | POA: Insufficient documentation

## 2017-12-30 DIAGNOSIS — E1140 Type 2 diabetes mellitus with diabetic neuropathy, unspecified: Secondary | ICD-10-CM | POA: Diagnosis not present

## 2017-12-30 MED ORDER — DULOXETINE HCL 30 MG PO CPEP: 30.0000 mg | ORAL_CAPSULE | Freq: Every day | ORAL | 3 refills | Status: DC

## 2017-12-30 NOTE — Telephone Encounter (Signed)
Scheduled appt per 12/23 los - pt is aware pf appt date and time

## 2017-12-30 NOTE — Progress Notes (Signed)
Radiation Oncology         [id number]) [phone] ________________________________  Name: Charles [name] MRN: [id number]  Date of Service: 12/30/2017 DOB: 1965-11-26  Post Treatment Note  CC: Lujean Amel, MD  Ladell Pier, MD  Diagnosis:  Stage IIIB, cT3N1M0 adenocarcinoma of the rectum  Interval Since Last Radiation:  6 weeks   10/16/2017 - 11/22/2017: The rectum was initially treated to 45 Gy in 25 fractions, followed by a 5.4 Gy boost delivered in 3 fractions, to yield a final total dose of 50.4 Gy.  Narrative:  The patient returns today for routine follow-up.  He tolerated radiotherapy well and is scheduled to undergo surgical resection with Dr. Dema Severin on 01/17/18.                            On review of systems, the patient states he's feeling very well. He no longer has pelvic fullness or rectal heaviness. He denies rectal bleeding, discharge, or difficult with defecation. He's eating well and has had tighter glucose control with his last A1C dropping from 10 to 8. He's very pleased by this and no other complaints are noted.  ALLERGIES:  is allergic to cefradine [cephradine].  Meds: Current Outpatient Medications  Medication Sig Dispense Refill  . B-D UF III MINI PEN NEEDLES 31G X 5 MM MISC USE AS DIRECTED EVERY DAY  11  . cetirizine (ZYRTEC) 10 MG tablet Take 10 mg by mouth daily as needed for allergies.    . Continuous Blood Gluc Sensor (FREESTYLE LIBRE 14 DAY SENSOR) MISC APPLY 1 SENSOR TO THE BACK OF UPPER ARM EVERY 14 DAYS  11  . empagliflozin (JARDIANCE) 25 MG TABS tablet Take 25 mg by mouth daily.    Marland Kitchen ibuprofen (ADVIL,MOTRIN) 200 MG tablet Take 400 mg by mouth every 6 (six) hours as needed for mild pain.    Marland Kitchen lidocaine-prilocaine (EMLA) cream Apply 1 application topically as needed. 30 g 0  . liraglutide (VICTOZA) 18 MG/3ML SOPN Inject 1.8 mg into the skin daily.    Marland Kitchen NOVOLOG MIX 70/30 FLEXPEN (70-30) 100 UNIT/ML FlexPen INJECT 140 UNITS SUBCUTANEOUSLY AT BEGINNING OF  BREAKFAST AND 40 UNITS AT EVENING MEAL  1  . atorvastatin (LIPITOR) 80 MG tablet Take 80 mg by mouth daily at 6 PM.     . fluconazole (DIFLUCAN) 100 MG tablet Take 1 tablet (100 mg total) by mouth daily. (Patient not taking: Reported on 12/03/2017) 7 tablet 1  . gabapentin (NEURONTIN) 300 MG capsule Take 1 capsule (300 mg total) by mouth 2 (two) times daily. (Patient not taking: Reported on 12/30/2017) 60 capsule 1  . insulin lispro protamine-lispro (HUMALOG 75/25 MIX) (75-25) 100 UNIT/ML SUSP injection Inject 60-150 Units into the skin 2 (two) times daily with a meal.     . potassium chloride SA (K-DUR,KLOR-CON) 20 MEQ tablet Take 1 tablet (20 mEq total) by mouth daily. (Patient not taking: Reported on 12/03/2017) 30 tablet 3  . prochlorperazine (COMPAZINE) 10 MG tablet Take 1 tablet (10 mg total) by mouth every 6 (six) hours as needed for nausea or vomiting. (Patient not taking: Reported on 10/28/2017) 30 tablet 0  . traMADol (ULTRAM) 50 MG tablet Take 1 tablet (50 mg total) by mouth every 6 (six) hours as needed (pain not controlled with ibuprofen and tylenol). (Patient not taking: Reported on 10/28/2017) 20 tablet 0   No current facility-administered medications for this encounter.    Facility-Administered Medications Ordered in Other  Encounters  Medication Dose Route Frequency Provider Last Rate Last Dose  . sodium chloride flush (NS) 0.9 % injection 10 mL  10 mL Intracatheter PRN Ladell Pier, MD   10 mL at 09/28/17 1343    Physical Findings:  height is 5\' 8"  (1.727 m) and weight is 202 lb 6 oz (91.8 kg). His oral temperature is 97.8 F (36.6 C). His blood pressure is 123/74 and his pulse is 108 (abnormal). His respiration is 18 and oxygen saturation is 99%.  Pain Assessment Pain Score: 0-No pain/10 In general this is a well appearing black male in no acute distress. He's alert and oriented x4 and appropriate throughout the examination. Cardiopulmonary assessment is negative for acute  distress and he exhibits normal effort.   Lab Findings: Lab Results  Component Value Date   WBC 4.8 12/03/2017   HGB 12.2 (L) 12/03/2017   HCT 35.1 (L) 12/03/2017   MCV 97.0 12/03/2017   PLT 128 (L) 12/03/2017     Radiographic Findings: Ct Chest W Contrast  Result Date: 12/11/2017 CLINICAL DATA:  Rectal cancer.  Chemo radiation therapy complete. EXAM: CT CHEST, ABDOMEN, AND PELVIS WITH CONTRAST TECHNIQUE: Multidetector CT imaging of the chest, abdomen and pelvis was performed following the standard protocol during bolus administration of intravenous contrast. CONTRAST:  115mL ISOVUE-300 IOPAMIDOL (ISOVUE-300) INJECTION 61% COMPARISON:  CT 06/06/2017 FINDINGS: CT CHEST FINDINGS Cardiovascular: Port in the anterior chest wall with tip in distal SVC. Apparent RIGHT subclavian artery extends posterior the esophagus. Mediastinum/Nodes: No axillary or supraclavicular adenopathy. No mediastinal adenopathy. No pericardial effusion Lungs/Pleura: No suspicious pulmonary nodule. Musculoskeletal: No aggressive osseous lesion. CT ABDOMEN AND PELVIS FINDINGS Hepatobiliary: Low-attenuation liver could represent hepatic steatosis. No focal hepatic lesion. Normal gallbladder. Pancreas: Pancreas is normal. No ductal dilatation. No pancreatic inflammation. Spleen: Normal spleen Adrenals/urinary tract: Adrenal glands and kidneys are normal. The ureters and bladder normal. Stomach/Bowel: Stomach, small bowel, appendix, and cecum are normal. The colon and rectosigmoid colon are normal. Thickening through the distal rectum is not well identified on current CT scan. Vascular/Lymphatic: Abdominal aorta normal caliber. No retroperitoneal adenopathy. Small lymph nodes in the perirectal space defined on comparison CT exam are near completely resolved. One 4 mm presacral lymph node does remain (image 100/2 decreased from 14 mm). Reproductive: Prostate normal Other: No free fluid. Musculoskeletal: No aggressive osseous lesion.  Small round sclerotic lesion in the RIGHT iliac bone measuring 6 mm is unchanged. IMPRESSION: Chest Impression: No evidence of thoracic metastasis. Abdomen / Pelvis Impression: 1. Reduction in size of rectal mass which is not clearly measurable on current CT scan. 2. Reduction size of perirectal lymph nodes. Single presacral lymph node remains. 3. No evidence of new lymphadenopathy or metastatic disease in the abdomen pelvis. Electronically Signed   By: Suzy Bouchard M.D.   On: 12/11/2017 10:35   Ct Abdomen Pelvis W Contrast  Result Date: 12/11/2017 CLINICAL DATA:  Rectal cancer.  Chemo radiation therapy complete. EXAM: CT CHEST, ABDOMEN, AND PELVIS WITH CONTRAST TECHNIQUE: Multidetector CT imaging of the chest, abdomen and pelvis was performed following the standard protocol during bolus administration of intravenous contrast. CONTRAST:  126mL ISOVUE-300 IOPAMIDOL (ISOVUE-300) INJECTION 61% COMPARISON:  CT 06/06/2017 FINDINGS: CT CHEST FINDINGS Cardiovascular: Port in the anterior chest wall with tip in distal SVC. Apparent RIGHT subclavian artery extends posterior the esophagus. Mediastinum/Nodes: No axillary or supraclavicular adenopathy. No mediastinal adenopathy. No pericardial effusion Lungs/Pleura: No suspicious pulmonary nodule. Musculoskeletal: No aggressive osseous lesion. CT ABDOMEN AND PELVIS FINDINGS Hepatobiliary:  Low-attenuation liver could represent hepatic steatosis. No focal hepatic lesion. Normal gallbladder. Pancreas: Pancreas is normal. No ductal dilatation. No pancreatic inflammation. Spleen: Normal spleen Adrenals/urinary tract: Adrenal glands and kidneys are normal. The ureters and bladder normal. Stomach/Bowel: Stomach, small bowel, appendix, and cecum are normal. The colon and rectosigmoid colon are normal. Thickening through the distal rectum is not well identified on current CT scan. Vascular/Lymphatic: Abdominal aorta normal caliber. No retroperitoneal adenopathy. Small lymph  nodes in the perirectal space defined on comparison CT exam are near completely resolved. One 4 mm presacral lymph node does remain (image 100/2 decreased from 14 mm). Reproductive: Prostate normal Other: No free fluid. Musculoskeletal: No aggressive osseous lesion. Small round sclerotic lesion in the RIGHT iliac bone measuring 6 mm is unchanged. IMPRESSION: Chest Impression: No evidence of thoracic metastasis. Abdomen / Pelvis Impression: 1. Reduction in size of rectal mass which is not clearly measurable on current CT scan. 2. Reduction size of perirectal lymph nodes. Single presacral lymph node remains. 3. No evidence of new lymphadenopathy or metastatic disease in the abdomen pelvis. Electronically Signed   By: Suzy Bouchard M.D.   On: 12/11/2017 10:35    Impression/Plan: 1. Stage IIIB, cT3N1M0 adenocarcinoma of the rectum. The patient is recovering well from the effects of radiotherapy. He is scheduled to undergo surgical resection on 01/17/18 and we will follow up with the results of surgery in GI conference. He will contact us if he has questions or concerns, otherwise we will see him back as needed moving forward.  2. Chemotherapy induced neuropathy. I encouraged him to keep Dr. Benay Spice informed as well but suggested he try Vitamin B6 which is over the counter as he's not comfortable trying Neurontin after reading the package insert. This will be followed expectantly.     Carola Rhine, PAC

## 2017-12-30 NOTE — Progress Notes (Addendum)
[facility] OFFICE PROGRESS NOTE   Diagnosis: Rectal cancer  INTERVAL HISTORY:    Charles [name] returns as scheduled.  He feels well.  Good appetite.  No difficulty with bowel function.  The skin hyperpigmentation is improving.  He continues to have numbness and discomfort in the hands, legs, and feet.  He feels "stiff "in the legs.  He has difficulty with balance.  He took 1 dose of gabapentin and discontinued this secondary to concern over possible side effects. Restaging CTs 12/10/2017 revealed No evidence of metastatic disease in the chest.  The rectal mass has decreased in size.  Near complete resolution of perirectal lymphadenopathy. Objective:  Vital signs in last 24 hours:  Blood pressure 140/75, pulse (!) 104, temperature 98.4 F (36.9 C), temperature source Oral, resp. rate 17, height 5\' 8"  (1.727 m), weight 202 lb 8 oz (91.9 kg), SpO2 100 %.    HEENT: No thrush Resp: Lungs clear bilaterally Cardio: Regular rate and rhythm GI: No hepatosplenomegaly, nontender Vascular: No leg edema Neuro: Very mild loss of vibratory sense at a few of the fingertips, others have normal vibratory sense Skin: Mild hyperpigmentation of the hands  Portacath/PICC-without erythema  Lab Results:  Lab Results  Component Value Date   WBC 4.8 12/03/2017   HGB 12.2 (L) 12/03/2017   HCT 35.1 (L) 12/03/2017   MCV 97.0 12/03/2017   PLT 128 (L) 12/03/2017   NEUTROABS 3.9 12/03/2017    CMP  Lab Results  Component Value Date   NA 141 11/11/2017   K 3.7 11/11/2017   CL 104 11/11/2017   CO2 26 11/11/2017   GLUCOSE 236 (H) 11/11/2017   BUN 11 11/11/2017   CREATININE 0.94 11/11/2017   CALCIUM 9.6 11/11/2017   PROT 7.3 11/11/2017   ALBUMIN 3.6 11/11/2017   AST 21 11/11/2017   ALT 24 11/11/2017   ALKPHOS 118 11/11/2017   BILITOT 0.6 11/11/2017   GFRNONAA >60 11/11/2017   GFRAA >60 11/11/2017     Medications: I have reviewed the patient's current  medications.   Assessment/Plan:  1. Rectal cancer, clinical stage III ? Colonoscopy 05/24/2017, mass at 12 cm from the anal verge, biopsy confirmed invasive poorly differentiated adenocarcinoma ? CTs 06/06/2017-rectal mass, perirectal adenopathy, no evidence of metastatic disease, nonspecific 1 cm sclerotic lesion at the right iliac crest ? MRI 06/06/2017, T3cN1tumor measured at 5.3 cm from the anal sphincter ? Cycle 1 FOLFOX 06/20/2017 ? Cycle 2 FOLFOX 07/04/2017 ? Cycle 3 FOLFOX 07/17/2017 ? Cycle 4 FOLFOX 08/01/2017 (Emend added secondary to prolonged nausea following chemotherapy) ? Cycle 5 FOLFOX 08/15/2017 ? Cycle 6 FOLFOX 08/29/2017 ? Cycle 7 FOLFOX 09/12/2017 ? Cycle 8 FOLFOX 09/26/2017 (dose reduced due to neuropathy and thrombocytopenia) ? Initiation of radiation and capecitabine 10/16/2017, completed 11/22/2017 ? Restaging CTs 12/10/2017- no evidence of metastatic disease, decrease in the rectal mass and near complete resolution of perirectal lymphadenopathy  2. Diabetes 3. Glaucoma 4. Cecal polyp, tubular adenoma noted on the colonoscopy 05/24/2017 5. Port-A-Cath placement,Dr. White,06/17/2017 6. Oxaliplatin neuropathy    Disposition: Mr. Ginley appears unchanged.  The restaging CTs reveal a response to therapy and no evidence of disease progression.  He is scheduled for surgery 01/17/2018.  He will return for an office visit after surgery.  He has persistent symptoms of oxaliplatin neuropathy.  Hopefully these symptoms will improve over the next few months.  He does not wish to try gabapentin.  He agrees to a trial of Cymbalta.  We reviewed potential toxicities associated  with Cymbalta.  He will contact us in approximately 1 week with a report on the effectiveness of the Cymbalta.  Betsy Coder, MD  12/30/2017  12:13 PM

## 2017-12-31 ENCOUNTER — Telehealth: Payer: Self-pay | Admitting: *Deleted

## 2017-12-31 ENCOUNTER — Other Ambulatory Visit: Payer: Self-pay | Admitting: *Deleted

## 2017-12-31 NOTE — Telephone Encounter (Signed)
Informed him that cymbalta has been called in to his [facility] pharmacy and instructed him not to take the Tramadol while he is on this medication due to drug interaction. He will also discontinue the gabapentin.

## 2018-01-09 DIAGNOSIS — Z0000 Encounter for general adult medical examination without abnormal findings: Secondary | ICD-10-CM | POA: Diagnosis not present

## 2018-01-10 DIAGNOSIS — H401131 Primary open-angle glaucoma, bilateral, mild stage: Secondary | ICD-10-CM | POA: Diagnosis not present

## 2018-01-10 NOTE — Progress Notes (Signed)
ekg 06-14-17 epic Chest ct 12-11-17 epic

## 2018-01-10 NOTE — Patient Instructions (Addendum)
Charles [name]  01/10/2018   Your procedure is scheduled on: 01-17-18  Report to [facility] Main  Entrance  Report to admitting at  530 AM    Call this number if you have problems the morning of surgery [phone]   Remember: [place], NO [place]. DRINK 2 PRESURGERY ENSURE DRINKS THE NIGHT BEFORE SURGERY AT  1000 PM AND 1 PRESURGERY DRINK THE DAY OF THE PROCEDURE 3 HOURS PRIOR TO SCHEDULED SURGERY. NO SOLIDS AFTER MIDNIGHT THE DAY PRIOR TO THE SURGERY. NOTHING BY MOUTH EXCEPT CLEAR LIQUIDS UNTIL THREE HOURS PRIOR TO SCHEDULED SURGERY. PLEASE FINISH PRESURGERY ENSURE DRINK PER SURGEON ORDER 3 HOURS PRIOR TO SCHEDULED SURGERY TIME WHICH NEEDS TO BE COMPLETED AT 430 AM.   FOLLOW ALL BOWEL PREP INSTRUTIONS FROM DR WHITE, DRINK PLENTY OF CLEAR LIQUIDS TO AVOID DEHYDRATION   CLEAR LIQUID DIET   Foods Allowed                                                                     Foods Excluded  Coffee and tea, regular and decaf                             liquids that you cannot  Plain Jell-O in any flavor                                             see through such as: Fruit ices (not with fruit pulp)                                     milk, soups, orange juice  Iced Popsicles                                    All solid food Carbonated beverages, regular and diet                                    Cranberry, grape and apple juices Sports drinks like Gatorade Lightly seasoned clear broth or consume(fat free) Sugar, honey syrup  Sample Menu Breakfast                                Lunch                                     Supper Cranberry juice                    Beef broth  Chicken broth Jell-O                                     Grape juice                           Apple juice Coffee or tea                        Jell-O                                      Popsicle                                                 Coffee or tea                        Coffee or tea  _____________________________________________________________________    How to Manage Your Diabetes Before and After Surgery  Why is it important to control my blood sugar before and after surgery? . Improving blood sugar levels before and after surgery helps healing and can limit problems. . A way of improving blood sugar control is eating a healthy diet by: o  Eating less sugar and carbohydrates o  Increasing activity/exercise o  Talking with your doctor about reaching your blood sugar goals . High blood sugars (greater than 180 mg/dL) can raise your risk of infections and slow your recovery, so you will need to focus on controlling your diabetes during the weeks before surgery. . Make sure that the doctor who takes care of your diabetes knows about your planned surgery including the date and location.  How do I manage my blood sugar before surgery? . Check your blood sugar at least 4 times a day, starting 2 days before surgery, to make sure that the level is not too high or low. o Check your blood sugar the morning of your surgery when you wake up and every 2 hours until you get to the Short Stay unit. . If your blood sugar is less than 70 mg/dL, you will need to treat for low blood sugar: o Do not take insulin. o Treat a low blood sugar (less than 70 mg/dL) with  cup of clear juice (cranberry or apple), 4 glucose tablets, OR glucose gel. o Recheck blood sugar in 15 minutes after treatment (to make sure it is greater than 70 mg/dL). If your blood sugar is not greater than 70 mg/dL on recheck, call [phone] for further instructions. . Report your blood sugar to the short stay nurse when you get to Short Stay.  . If you are admitted to the hospital after surgery: o Your blood sugar will be checked by the staff and you will probably be given insulin after surgery (instead of oral diabetes  medicines) to make sure you have good blood sugar levels. o The goal for blood sugar control after surgery is 80-180 mg/dL.   WHAT DO I DO ABOUT MY DIABETES MEDICATION?  Marland Kitchen Do not take oral diabetes medicines (pills) the morning of surgery.  . THE DAY  BEFORE SURGERY, take USUAL AM  DOSE OF 70/30 INSULIN. TAKE 70 % OF PM 70/30 INSULIN NIGHT BEFORE SURGERY (TAKE 28 UNITS)      THE MORNING OF SURGERY DO NOT TAKE YOUR 70/30 INSULIN . The day before surgery take your victoza as usual, morning of surgery do not take your victoza .  Marland Kitchen Take your jardiance as usual day before surgery and do not take your jardiance day of surgery  Patient Signature:  Date:   Nurse Signature:  Date:   Reviewed and Endorsed by [facility] Patient Education Committee, August 2015   Take these medicines the morning of surgery with A SIP OF WATER: NONE DO NOT TAKE ANY DIABETIC MEDICATIONS DAY OF [place] may not have any metal on your body including hair pins and              piercings  Do not wear jewelry, make-up, lotions, powders or perfumes, deodorant             Do not wear nail polish.  Do not shave  48 hours prior to surgery.              Men may shave face and neck.   Do not bring valuables to the hospital. [place].  Contacts, dentures or bridgework may not be worn into surgery.  Leave suitcase in the car. After surgery it may be brought to your room.                  Please read over the following fact sheets you were given: _____________________________________________________________________             [facility] - Preparing for Surgery Before surgery, you can play an important role.  Because skin is not sterile, your skin needs to be as free of germs as possible.  You can reduce the number of germs on your skin by washing with CHG (chlorahexidine gluconate) soap before surgery.  CHG is an antiseptic  cleaner which kills germs and bonds with the skin to continue killing germs even after washing. Please DO NOT use if you have an allergy to CHG or antibacterial soaps.  If your skin becomes reddened/irritated stop using the CHG and inform your nurse when you arrive at Short Stay. Do not shave (including legs and underarms) for at least 48 hours prior to the first CHG shower.  You may shave your face/neck. Please follow these instructions carefully:  1.  Shower with CHG Soap the night before surgery and the  morning of Surgery.  2.  If you choose to wash your hair, wash your hair first as usual with your  normal  shampoo.  3.  After you shampoo, rinse your hair and body thoroughly to remove the  shampoo.                           4.  Use CHG as you would any other liquid soap.  You can apply chg directly  to the skin and wash                       Gently with a scrungie or clean washcloth.  5.  Apply the CHG Soap to your body ONLY FROM THE NECK DOWN.   Do not use on face/ open                           Wound or open sores. Avoid contact with eyes, ears mouth and genitals (private parts).                       Wash face,  Genitals (private parts) with your normal soap.             6.  Wash thoroughly, paying special attention to the area where your surgery  will be performed.  7.  Thoroughly rinse your body with warm water from the neck down.  8.  DO NOT shower/wash with your normal soap after using and rinsing off  the CHG Soap.                9.  Pat yourself dry with a clean towel.            10.  Wear clean pajamas.            11.  Place clean sheets on your bed the night of your first shower and do not  sleep with pets. Day of Surgery : Do not apply any lotions/deodorants the morning of surgery.  Please wear clean clothes to the hospital/surgery center.  FAILURE TO FOLLOW THESE INSTRUCTIONS MAY RESULT IN THE CANCELLATION OF YOUR SURGERY PATIENT  SIGNATURE_________________________________  NURSE SIGNATURE__________________________________  ________________________________________________________________________

## 2018-01-13 ENCOUNTER — Encounter (HOSPITAL_COMMUNITY): Payer: Self-pay

## 2018-01-13 ENCOUNTER — Other Ambulatory Visit: Payer: Self-pay

## 2018-01-13 ENCOUNTER — Ambulatory Visit: Payer: Self-pay | Admitting: Surgery

## 2018-01-13 ENCOUNTER — Encounter (HOSPITAL_COMMUNITY)
Admission: RE | Admit: 2018-01-13 | Discharge: 2018-01-13 | Disposition: A | Discharge status: Home / Self Care | Payer: 59 | Source: Ambulatory Visit | Attending: Surgery | Admitting: Surgery

## 2018-01-13 DIAGNOSIS — Z01812 Encounter for preprocedural laboratory examination: Secondary | ICD-10-CM | POA: Insufficient documentation

## 2018-01-13 HISTORY — DX: Personal history of antineoplastic chemotherapy: Z92.21

## 2018-01-13 HISTORY — DX: Polyneuropathy, unspecified: G62.9

## 2018-01-13 LAB — CBC WITH DIFFERENTIAL/PLATELET
Abs Immature Granulocytes: 0.04 10*3/uL (ref 0.00–0.07)
Basophils Absolute: 0 10*3/uL (ref 0.0–0.1)
Basophils Relative: 1 %
Eosinophils Absolute: 0.1 10*3/uL (ref 0.0–0.5)
Eosinophils Relative: 1 %
HCT: 44.1 % (ref 39.0–52.0)
Hemoglobin: 14.6 g/dL (ref 13.0–17.0)
Immature Granulocytes: 1 %
Lymphocytes Relative: 9 %
Lymphs Abs: 0.4 10*3/uL — ABNORMAL LOW (ref 0.7–4.0)
MCH: 32.4 pg (ref 26.0–34.0)
MCHC: 33.1 g/dL (ref 30.0–36.0)
MCV: 97.8 fL (ref 80.0–100.0)
Monocytes Absolute: 0.4 10*3/uL (ref 0.1–1.0)
Monocytes Relative: 8 %
NEUTROS ABS: 3.9 10*3/uL (ref 1.7–7.7)
Neutrophils Relative %: 80 %
Platelets: 176 10*3/uL (ref 150–400)
RBC: 4.51 MIL/uL (ref 4.22–5.81)
RDW: 12.5 % (ref 11.5–15.5)
WBC: 4.8 10*3/uL (ref 4.0–10.5)
nRBC: 0 % (ref 0.0–0.2)

## 2018-01-13 LAB — COMPREHENSIVE METABOLIC PANEL
ALK PHOS: 100 U/L (ref 38–126)
ALT: 27 U/L (ref 0–44)
ANION GAP: 10 (ref 5–15)
AST: 20 U/L (ref 15–41)
Albumin: 4.2 g/dL (ref 3.5–5.0)
BUN: 15 mg/dL (ref 6–20)
CALCIUM: 9.3 mg/dL (ref 8.9–10.3)
CO2: 22 mmol/L (ref 22–32)
Chloride: 105 mmol/L (ref 98–111)
Creatinine, Ser: 0.85 mg/dL (ref 0.61–1.24)
GFR calc non Af Amer: >60 mL/min (ref >60)
Glucose, Bld: 261 mg/dL — ABNORMAL HIGH (ref 70–99)
Potassium: 4.4 mmol/L (ref 3.5–5.1)
Sodium: 137 mmol/L (ref 135–145)
Total Bilirubin: 0.7 mg/dL (ref 0.3–1.2)
Total Protein: 7.6 g/dL (ref 6.5–8.1)

## 2018-01-13 LAB — ABO/RH: ABO/RH(D): B POS

## 2018-01-13 LAB — GLUCOSE, CAPILLARY: Glucose-Capillary: 313 mg/dL — ABNORMAL HIGH (ref 70–99)

## 2018-01-13 LAB — APTT: aPTT: 25 seconds (ref 24–36)

## 2018-01-13 LAB — PROTIME-INR
INR: 0.96
Prothrombin Time: 12.6 seconds (ref 11.4–15.2)

## 2018-01-13 NOTE — H&P (Signed)
CC: Newly diagnosed rectal cancer, referred by Dr. Michail Sermon - here for f/u now s/p FOLFOX and then neoadjuvant chemoXRT  HPI: Mr. Hennon is a very pleasant 53 year old gentleman with history of hypertension, hyperlipidemia, diabetes here for evaluation of a newly diagnosed rectal cancer. He underwent a colonoscopy with Dr. Michail Sermon 05/24/17 which demonstrated a likely malignant tumor in the rectosigmoid colon at 12 cm proximal to the anus. Biopsied and tattooed. He also had 15 and only a polyp in the cecum removed with a hot snare. Internal hemorrhoids. We do not have a copy of the pathology report this time and has sent a request to Dr. Michail Sermon for this. This colonoscopy was done for the purposes of rectal bleeding when she had the last month. He had had no prior colonoscopy. He denies any issues with abdominal pain unless he saw a spicy foods. He denies any nausea or vomiting unless he's on spicy foods. He is having 3-4 bowel movements per day. They're intermittently blood tinged.  He tolerated systemic therapy reasonably well.  CT C/A/P 06/06/17 demonstrated an eccentric hyperenhancing upper left rectal 2.9 cm mass. 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 No definite additional sites of metastatic disease. Solitary sclerotic lesion in the superior right iliac crest nonspecific.  MRI Pelvis with/without contrast 06/06/17 Demonstrated rectal adenocarcinoma T3c N1, 5.3 cm from the anal sphincter by MRI - interpreted as mid rectum  CEA 0.7   INTERVAL HX Completed chemoradiation - 5.5 weeks - finished 11/22/17. Since completing neoadjuvant FOLFOX, he has developed numbness in his hands and feet. He also reports some proprioceptive issues in his feet. He denies any blood in his stool states this has resolved. He denies any fever/chills. He has been tolerating a diet without issues. He has managed to get his  weight 200 pounds and is working to lose an additional 20 pounds. He denies any issues with incontinence to gas, liquid stool, or solid stool   PMH: HTN, HLD, DM (on Victoza and insulin)  PSH: Open appendectomy via RLQ incision; right achilles; open inguinal hernia repair as infant x2  FHx: Aunt had lymphoma; Mother had cervical cancer; paternal family history unknown  Social: Denies use of tobacco/drugs; social EtOH  ROS: A comprehensive 10 system review of systems was completed with the patient and pertinent findings as noted above.  The patient is a 53 year old male.   Allergies Emeline Gins, [place]; 12/02/2017 10:09 AM) No Known Drug Allergies [05/31/2017]: Allergies Reconciled   Medication History Emeline Gins, CMA; 12/02/2017 10:10 AM) Latanoprost (0.005% Solution, Ophthalmic) Active. Lidocaine-Prilocaine (2.5-2.5% Cream, External) Active. Xeloda (500MG  Tablet, Oral) Active. Klor-Con M20 [facility] Tablet ER, Oral) Active. Fluconazole (100MG  Tablet, Oral) Active. Clotrimazole (10MG  Troche, Mouth/Throat) Active. Advil (200MG  Tablet, Oral) Active. NovoLOG Mix 70/30 FlexPen ((70-30) 100UNIT/ML Susp Pen-inj, Subcutaneous) Active. Prochlorperazine Maleate (10MG  Tablet, Oral) Active. traMADol HCl (50MG  Tablet, Oral) Active. Victoza (18MG /3ML Solution, Subcutaneous) Active. FreeStyle Libre 14 Day Sensor Active. Jardiance (10MG  Tablet, Oral) Active. Victoza (18MG /3ML Soln Pen-inj, Subcutaneous) Active. Atorvastatin Calcium (80MG  Tablet, Oral) Active. ZyrTEC Allergy (10MG  Capsule, Oral) Active. Medications Reconciled    Review of Systems Harrell Gave M. Montrez Marietta MD; 12/02/2017 11:02 AM) General Not Present- Appetite Loss, Chills, Fatigue, Fever, Night Sweats, Weight Gain and Weight Loss. Skin Not Present- Change in Wart/Mole, Dryness, Hives, Jaundice, New Lesions, Non-Healing Wounds, Rash and Ulcer. HEENT Present- Seasonal Allergies, Sore Throat and Wears  glasses/contact lenses. Not Present- Earache, Hearing Loss, Hoarseness, Nose Bleed,  Oral Ulcers, Ringing in the Ears, Sinus Pain, Visual Disturbances and Yellow Eyes. Respiratory Present- Snoring and Wheezing. Not Present- Bloody sputum, Chronic Cough and Difficulty Breathing. Breast Not Present- Breast Mass, Breast Pain, Nipple Discharge and Skin Changes. Cardiovascular Present- Leg Cramps. Not Present- Chest Pain, Difficulty Breathing Lying Down, Palpitations, Rapid Heart Rate, Shortness of Breath and Swelling of Extremities. Gastrointestinal Present- Hemorrhoids. Not Present- Abdominal Pain, Bloating, Bloody Stool, Change in Bowel Habits, Chronic diarrhea, Constipation, Difficulty Swallowing, Excessive gas, Gets full quickly at meals, Indigestion, Nausea, Rectal Pain and Vomiting. Male Genitourinary Present- Frequency and Urgency. Not Present- Blood in Urine, Change in Urinary Stream, Impotence, Nocturia, Painful Urination and Urine Leakage. Musculoskeletal Present- Muscle Pain. Not Present- Back Pain, Joint Pain, Joint Stiffness, Muscle Weakness and Swelling of Extremities. Neurological Not Present- Decreased Memory, Fainting, Headaches, Numbness, Seizures, Tingling, Tremor, Trouble walking and Weakness. Psychiatric Not Present- Anxiety, Bipolar, Change in Sleep Pattern, Depression, Fearful and Frequent crying. Endocrine Not Present- Cold Intolerance, Excessive Hunger, Hair Changes, Heat Intolerance and New Diabetes. Hematology Not Present- Blood Thinners, Easy Bruising, Excessive bleeding, Gland problems, HIV and Persistent Infections.  Vitals Emeline Gins CMA; 12/02/2017 10:09 AM) 12/02/2017 10:09 AM Weight: 200.5 lb Height: 68in Body Surface Area: 2.05 m Body Mass Index: 30.49 kg/m  Temp.: 98.88F  Pulse: 124 (Regular)  BP: 130/74 (Sitting, Left Arm, Standard)       Physical Exam Harrell Gave M. Agostino Gorin MD; 12/02/2017 11:03 AM) The physical exam findings are as  follows: Note:Constitutional: No acute distress; conversant; no deformities Eyes: Moist conjunctiva; no lid lag; anicteric sclerae; pupils equal round and reactive to light Neck: Trachea midline; no palpable thyromegaly Lungs: Normal respiratory effort; no tactile fremitus CV: Regular rate and rhythm; no palpable thrill; no pitting edema GI: Abdomen obese, soft, nontender, nondistended; no palpable hepatosplenomegaly Anorectal: Mass has not been palpable on exam so was deferred today; has had good tone MSK: Normal gait; no clubbing/cyanosis Psychiatric: Appropriate affect; alert and oriented 3 Lymphatic: No palpable cervical or axillary lymphadenopathy    Assessment & Plan Harrell Gave M. Lugene Beougher MD; 12/02/2017 11:07 AM) RECTAL CANCER (C20) Story: Mr. Fauble is a very pleasant 89yoM with HTN, HLD, DM here today for f/u - cT3cN1M0 invasive adenoCA of the rectum - 12cm from verge on scope, 5.3cm from verge on MRI; not palpable on physical exam; CEA 0.7 He has completed systemic chemotherapy and now chemoXRT - last day was 11/22/17 Impression: -We discussed at length today the anatomy physiology of the GI tract. We discussed the pathophysiology of rectal cancer. We also discussed the pathophysiology of colorectal cancer again today. -We discussed today the surgical approach to the treatment of rectal cancer. -Will plan repeat CT Chest/Abd/Pelvis with IV contrast for re-staging purposes - to be completed prior to surgery. -We also discussed watchful wait protocols and potential for this option should he decide to pursue this. He has mentioned anxiety from having his tumor and the unknown regarding his lymph nodes even if imaging shows a good response, would give him quite a bit of anxiety. For these reasons, he has elected to pursue surgery -We discussed robotic/laparoscopic and open techniques; low anterior resection; possible temporary diverting loop ileostomy with his surgery and life therein  if this is indicated based on intraoperative findings and anastomotic distance from anal verge. We discussed need for cysto/stents by urology given pelvic XRT. We discussed expectations following surgery. I answered all his and his wife's questions. -The planned procedure, material risks (including, but not limited to, pain,  bleeding, infection, scarring, need for blood transfusion, damage to surrounding structures- blood vessels/nerves/viscus/organs, damage to ureter, urine leak, leak from anastomosis, need for additional procedures, need for stoma which may be permanent, hernia, recurrence, pneumonia, heart attack, stroke, death) benefits and alternatives to surgery were discussed at length. I noted a good probability that the procedure would help improve their symptoms. The patient's questions were answered to his satisfaction, he voiced understanding and elected to proceed with surgery. -Tenatively planning January 17, 2018 -I spent 45 minutes face to face going over all the above Current Plans CT CHEST W CON (45859) (BOTH WITH I.V. CONTRAST) CT ABDOMEN PELVIS W CON (29244) Signed electronically by Ileana Roup, MD (12/02/2017 11:07 AM)

## 2018-01-13 NOTE — Consult Note (Signed)
[place] Nurse requested for preoperative stoma site marking  Discussed surgical procedure and stoma creation with patient and family.  Explained role of the [place] nurse team.  Provided the patient with educational booklet and provided samples of pouching options.  Answered patient and family questions.   Examined patient lying, sitting, and standing in order to place the marking in the patient's visual field, away from any creases or abdominal contour issues and within the rectus muscle.  Attempted to mark above the patient's belt line.   Marked for ileostomy in the RUQ  _9___cm to the right of the umbilicus and  __0__ cm above the umbilicus.  Patient's abdomen cleansed with CHG wipes at site markings, allowed to air dry prior to marking.Covered mark with thin film transparent dressing to preserve mark until date of surgery.   Total of one hour spent with the patient and his spouse in providing information and answering all their questions to the "potential" creation of the ileostomy ("Potential" per the patient and spouse)  [place] Nurse team will follow up with patient after surgery for continue ostomy care and teaching.    Val Riles, RN, MSN, CWOCN, CNS-BC, pager [phone]

## 2018-01-13 NOTE — Progress Notes (Signed)
PATIENT GIVEN BOWEL PRE INSTRUCTIONS AND PATIENT VOCALIZED UNDERSTANDING BOWEL PREP INSTRUCTIONS THAT WERE FAXED FROM OFFICE

## 2018-01-14 LAB — HEMOGLOBIN A1C
HEMOGLOBIN A1C: 8.3 % — AB (ref 4.8–5.6)
Mean Plasma Glucose: 192 mg/dL

## 2018-01-14 NOTE — Progress Notes (Signed)
PCP: dibas koirala  CARDIOLOGIST: none  INFO IN Epic: hemaglobian a1c result  INFO ON CHART:  BLOOD THINNERS AND LAST DOSES: none ____________________________________  PATIENT SYMPTOMS AT TIME OF PREOP: none Chart given to [place] pa for hemaglobain a1c follow up

## 2018-01-15 NOTE — Anesthesia Preprocedure Evaluation (Addendum)
Anesthesia Evaluation  Patient identified by MRN, date of birth, ID band Patient awake    Reviewed: Allergy & Precautions, NPO status , Patient's Chart, lab work & pertinent test results  Airway Mallampati: II  TM Distance: >3 FB     Dental   Pulmonary neg pulmonary ROS,    breath sounds clear to auscultation       Cardiovascular negative cardio ROS   Rhythm:Regular     Neuro/Psych    GI/Hepatic Neg liver ROS, History noted. CG   Endo/Other  diabetes  Renal/GU negative Renal ROS     Musculoskeletal   Abdominal   Peds  Hematology   Anesthesia Other Findings   Reproductive/Obstetrics                         Anesthesia Physical Anesthesia Plan  ASA: III  Anesthesia Plan: General   Post-op Pain Management:    Induction: Intravenous  PONV Risk Score and Plan: Ondansetron, Treatment may vary due to age or medical condition, Dexamethasone and Midazolam  Airway Management Planned: Oral ETT  Additional Equipment:   Intra-op Plan:   Post-operative Plan: Possible Post-op intubation/ventilation  Informed Consent: I have reviewed the patients History and Physical, chart, labs and discussed the procedure including the risks, benefits and alternatives for the proposed anesthesia with the patient or authorized representative who has indicated his/her understanding and acceptance.   Dental advisory given  Plan Discussed with:   Anesthesia Plan Comments: (At PST visit 01/13/18 blood glucose 261 A1C 8.3, values called to surgeon Dr. Nadeen Landau, recheck DOS.  Konrad Felix, PA-C)       Anesthesia Quick Evaluation

## 2018-01-16 MED ORDER — BUPIVACAINE LIPOSOME 1.3 % IJ SUSP
20.0000 mL | Freq: Once | INTRAMUSCULAR | Status: DC
  Filled 2018-01-16: qty 20

## 2018-01-17 ENCOUNTER — Encounter (HOSPITAL_COMMUNITY): Payer: Self-pay | Admitting: Emergency Medicine

## 2018-01-17 ENCOUNTER — Inpatient Hospital Stay (HOSPITAL_COMMUNITY): Payer: 59 | Admitting: Physician Assistant

## 2018-01-17 ENCOUNTER — Inpatient Hospital Stay (HOSPITAL_COMMUNITY): Payer: 59

## 2018-01-17 ENCOUNTER — Other Ambulatory Visit: Payer: Self-pay

## 2018-01-17 ENCOUNTER — Inpatient Hospital Stay (HOSPITAL_COMMUNITY)
Admission: RE | Admit: 2018-01-17 | Discharge: 2018-01-21 | DRG: 330 | Disposition: A | Discharge status: Home / Self Care | Payer: 59 | Attending: Surgery | Admitting: Surgery

## 2018-01-17 ENCOUNTER — Encounter (HOSPITAL_COMMUNITY)
Admission: RE | Disposition: A | Discharge status: Home / Self Care | Payer: Self-pay | Source: Home / Self Care | Attending: Surgery

## 2018-01-17 ENCOUNTER — Inpatient Hospital Stay (HOSPITAL_COMMUNITY): Payer: 59 | Admitting: Anesthesiology

## 2018-01-17 DIAGNOSIS — Z6832 Body mass index (BMI) 32.0-32.9, adult: Secondary | ICD-10-CM

## 2018-01-17 DIAGNOSIS — Z9049 Acquired absence of other specified parts of digestive tract: Secondary | ICD-10-CM

## 2018-01-17 DIAGNOSIS — Z8249 Family history of ischemic heart disease and other diseases of the circulatory system: Secondary | ICD-10-CM | POA: Diagnosis not present

## 2018-01-17 DIAGNOSIS — Z888 Allergy status to other drugs, medicaments and biological substances status: Secondary | ICD-10-CM

## 2018-01-17 DIAGNOSIS — I10 Essential (primary) hypertension: Secondary | ICD-10-CM | POA: Diagnosis not present

## 2018-01-17 DIAGNOSIS — E669 Obesity, unspecified: Secondary | ICD-10-CM | POA: Diagnosis present

## 2018-01-17 DIAGNOSIS — C19 Malignant neoplasm of rectosigmoid junction: Secondary | ICD-10-CM | POA: Diagnosis not present

## 2018-01-17 DIAGNOSIS — K648 Other hemorrhoids: Secondary | ICD-10-CM | POA: Diagnosis present

## 2018-01-17 DIAGNOSIS — Z8049 Family history of malignant neoplasm of other genital organs: Secondary | ICD-10-CM | POA: Diagnosis not present

## 2018-01-17 DIAGNOSIS — Z8042 Family history of malignant neoplasm of prostate: Secondary | ICD-10-CM

## 2018-01-17 DIAGNOSIS — Z9221 Personal history of antineoplastic chemotherapy: Secondary | ICD-10-CM | POA: Diagnosis not present

## 2018-01-17 DIAGNOSIS — E119 Type 2 diabetes mellitus without complications: Secondary | ICD-10-CM | POA: Diagnosis present

## 2018-01-17 DIAGNOSIS — C775 Secondary and unspecified malignant neoplasm of intrapelvic lymph nodes: Secondary | ICD-10-CM | POA: Diagnosis present

## 2018-01-17 DIAGNOSIS — Z408 Encounter for other prophylactic surgery: Secondary | ICD-10-CM | POA: Diagnosis not present

## 2018-01-17 DIAGNOSIS — Z807 Family history of other malignant neoplasms of lymphoid, hematopoietic and related tissues: Secondary | ICD-10-CM | POA: Diagnosis not present

## 2018-01-17 DIAGNOSIS — C772 Secondary and unspecified malignant neoplasm of intra-abdominal lymph nodes: Secondary | ICD-10-CM | POA: Diagnosis not present

## 2018-01-17 DIAGNOSIS — C20 Malignant neoplasm of rectum: Secondary | ICD-10-CM | POA: Diagnosis present

## 2018-01-17 DIAGNOSIS — Z86010 Personal history of colonic polyps: Secondary | ICD-10-CM | POA: Diagnosis not present

## 2018-01-17 DIAGNOSIS — Z8349 Family history of other endocrine, nutritional and metabolic diseases: Secondary | ICD-10-CM

## 2018-01-17 DIAGNOSIS — Z923 Personal history of irradiation: Secondary | ICD-10-CM | POA: Diagnosis not present

## 2018-01-17 DIAGNOSIS — Z800 Family history of malignant neoplasm of digestive organs: Secondary | ICD-10-CM

## 2018-01-17 DIAGNOSIS — D490 Neoplasm of unspecified behavior of digestive system: Secondary | ICD-10-CM | POA: Diagnosis not present

## 2018-01-17 DIAGNOSIS — G629 Polyneuropathy, unspecified: Secondary | ICD-10-CM

## 2018-01-17 DIAGNOSIS — E785 Hyperlipidemia, unspecified: Secondary | ICD-10-CM | POA: Diagnosis present

## 2018-01-17 DIAGNOSIS — Z794 Long term (current) use of insulin: Secondary | ICD-10-CM

## 2018-01-17 HISTORY — PX: FLEXIBLE SIGMOIDOSCOPY: SHX5431

## 2018-01-17 HISTORY — PX: CYSTOSCOPY WITH STENT PLACEMENT: SHX5790

## 2018-01-17 HISTORY — PX: ILEOSTOMY: SHX1783

## 2018-01-17 LAB — GLUCOSE, CAPILLARY
GLUCOSE-CAPILLARY: 244 mg/dL — AB (ref 70–99)
GLUCOSE-CAPILLARY: 200 mg/dL — AB (ref 70–99)
Glucose-Capillary: 236 mg/dL — ABNORMAL HIGH (ref 70–99)
Glucose-Capillary: 267 mg/dL — ABNORMAL HIGH (ref 70–99)

## 2018-01-17 LAB — TYPE AND SCREEN
ABO/RH(D): B POS
Antibody Screen: NEGATIVE

## 2018-01-17 SURGERY — COLECTOMY, PARTIAL, ROBOT-ASSISTED, LAPAROSCOPIC
Anesthesia: General | Site: Urethra

## 2018-01-17 MED ORDER — MIDAZOLAM HCL 2 MG/2ML IJ SOLN
INTRAMUSCULAR | Status: AC
  Filled 2018-01-17: qty 2

## 2018-01-17 MED ORDER — DEXAMETHASONE SODIUM PHOSPHATE 10 MG/ML IJ SOLN
INTRAMUSCULAR | Status: AC
  Filled 2018-01-17: qty 1

## 2018-01-17 MED ORDER — HYDRALAZINE HCL 20 MG/ML IJ SOLN: 10.0000 mg | INTRAMUSCULAR | Status: DC | PRN

## 2018-01-17 MED ORDER — NEOMYCIN SULFATE 500 MG PO TABS
1000.0000 mg | ORAL_TABLET | ORAL | Status: DC
  Filled 2018-01-17: qty 2

## 2018-01-17 MED ORDER — ALVIMOPAN 12 MG PO CAPS
12.0000 mg | ORAL_CAPSULE | ORAL | Status: AC
  Administered 2018-01-17: 12 mg via ORAL
  Filled 2018-01-17: qty 1

## 2018-01-17 MED ORDER — FENTANYL CITRATE (PF) 100 MCG/2ML IJ SOLN
INTRAMUSCULAR | Status: DC | PRN
  Administered 2018-01-17 (×2): 100 ug via INTRAVENOUS
  Administered 2018-01-17 (×6): 50 ug via INTRAVENOUS

## 2018-01-17 MED ORDER — ALUM & MAG HYDROXIDE-SIMETH 200-200-20 MG/5ML PO SUSP
30.0000 mL | Freq: Four times a day (QID) | ORAL | Status: DC | PRN
  Administered 2018-01-18: 30 mL via ORAL
  Filled 2018-01-17: qty 30

## 2018-01-17 MED ORDER — INSULIN ASPART 100 UNIT/ML ~~LOC~~ SOLN
0.0000 [IU] | Freq: Three times a day (TID) | SUBCUTANEOUS | Status: DC
  Administered 2018-01-17: 8 [IU] via SUBCUTANEOUS
  Administered 2018-01-18 – 2018-01-19 (×3): 3 [IU] via SUBCUTANEOUS
  Administered 2018-01-19: 11 [IU] via SUBCUTANEOUS
  Administered 2018-01-19: 2 [IU] via SUBCUTANEOUS
  Administered 2018-01-20: 5 [IU] via SUBCUTANEOUS
  Administered 2018-01-20: 2 [IU] via SUBCUTANEOUS
  Administered 2018-01-20 – 2018-01-21 (×2): 3 [IU] via SUBCUTANEOUS

## 2018-01-17 MED ORDER — ONDANSETRON HCL 4 MG/2ML IJ SOLN
4.0000 mg | Freq: Four times a day (QID) | INTRAMUSCULAR | Status: DC | PRN
  Administered 2018-01-17: 4 mg via INTRAVENOUS
  Filled 2018-01-17: qty 2

## 2018-01-17 MED ORDER — LIDOCAINE 20MG/ML (2%) 15 ML SYRINGE OPTIME
INTRAMUSCULAR | Status: DC | PRN
  Administered 2018-01-17: 1.5 mg/kg/h via INTRAVENOUS

## 2018-01-17 MED ORDER — ONDANSETRON HCL 4 MG/2ML IJ SOLN
INTRAMUSCULAR | Status: AC
  Filled 2018-01-17: qty 2

## 2018-01-17 MED ORDER — ROCURONIUM BROMIDE 10 MG/ML (PF) SYRINGE
PREFILLED_SYRINGE | INTRAVENOUS | Status: DC | PRN
  Administered 2018-01-17 (×2): 20 mg via INTRAVENOUS
  Administered 2018-01-17: 50 mg via INTRAVENOUS
  Administered 2018-01-17 (×2): 20 mg via INTRAVENOUS

## 2018-01-17 MED ORDER — FENTANYL CITRATE (PF) 100 MCG/2ML IJ SOLN
INTRAMUSCULAR | Status: AC
  Filled 2018-01-17: qty 4

## 2018-01-17 MED ORDER — POLYETHYLENE GLYCOL 3350 17 GM/SCOOP PO POWD
1.0000 | Freq: Once | ORAL | Status: DC
  Filled 2018-01-17: qty 255

## 2018-01-17 MED ORDER — LACTATED RINGERS IV SOLN
INTRAVENOUS | Status: DC
  Administered 2018-01-17 – 2018-01-18 (×4): via INTRAVENOUS

## 2018-01-17 MED ORDER — KETAMINE HCL 10 MG/ML IJ SOLN
INTRAMUSCULAR | Status: DC | PRN
  Administered 2018-01-17: 15 mg via INTRAVENOUS
  Administered 2018-01-17: 20 mg via INTRAVENOUS
  Administered 2018-01-17 (×2): 15 mg via INTRAVENOUS

## 2018-01-17 MED ORDER — DIPHENHYDRAMINE HCL 50 MG/ML IJ SOLN: 12.5000 mg | Freq: Four times a day (QID) | INTRAMUSCULAR | Status: DC | PRN

## 2018-01-17 MED ORDER — IOHEXOL 300 MG/ML  SOLN
INTRAMUSCULAR | Status: DC | PRN
  Administered 2018-01-17: 9 mL via INTRAVENOUS

## 2018-01-17 MED ORDER — ONDANSETRON HCL 4 MG PO TABS: 4.0000 mg | ORAL_TABLET | Freq: Four times a day (QID) | ORAL | Status: DC | PRN

## 2018-01-17 MED ORDER — DEXAMETHASONE SODIUM PHOSPHATE 10 MG/ML IJ SOLN
INTRAMUSCULAR | Status: DC | PRN
  Administered 2018-01-17: 8 mg via INTRAVENOUS

## 2018-01-17 MED ORDER — PHENYLEPHRINE 40 MCG/ML (10ML) SYRINGE FOR IV PUSH (FOR BLOOD PRESSURE SUPPORT)
PREFILLED_SYRINGE | INTRAVENOUS | Status: AC
  Filled 2018-01-17: qty 10

## 2018-01-17 MED ORDER — HEPARIN SODIUM (PORCINE) 5000 UNIT/ML IJ SOLN
5000.0000 [IU] | Freq: Three times a day (TID) | INTRAMUSCULAR | Status: DC
  Administered 2018-01-18 – 2018-01-21 (×10): 5000 [IU] via SUBCUTANEOUS
  Filled 2018-01-17 (×10): qty 1

## 2018-01-17 MED ORDER — PROPOFOL 10 MG/ML IV BOLUS
INTRAVENOUS | Status: DC | PRN
  Administered 2018-01-17: 180 mg via INTRAVENOUS

## 2018-01-17 MED ORDER — BUPIVACAINE LIPOSOME 1.3 % IJ SUSP
INTRAMUSCULAR | Status: DC | PRN
  Administered 2018-01-17: 20 mL

## 2018-01-17 MED ORDER — FENTANYL CITRATE (PF) 250 MCG/5ML IJ SOLN
INTRAMUSCULAR | Status: AC
  Filled 2018-01-17: qty 5

## 2018-01-17 MED ORDER — FENTANYL CITRATE (PF) 100 MCG/2ML IJ SOLN
25.0000 ug | INTRAMUSCULAR | Status: DC | PRN
  Administered 2018-01-17 (×2): 50 ug via INTRAVENOUS

## 2018-01-17 MED ORDER — ONDANSETRON HCL 4 MG/2ML IJ SOLN
INTRAMUSCULAR | Status: DC | PRN
  Administered 2018-01-17: 4 mg via INTRAVENOUS

## 2018-01-17 MED ORDER — LIDOCAINE 2% (20 MG/ML) 5 ML SYRINGE
INTRAMUSCULAR | Status: AC
  Filled 2018-01-17: qty 5

## 2018-01-17 MED ORDER — PHENYLEPHRINE 40 MCG/ML (10ML) SYRINGE FOR IV PUSH (FOR BLOOD PRESSURE SUPPORT)
PREFILLED_SYRINGE | INTRAVENOUS | Status: DC | PRN
  Administered 2018-01-17 (×5): 80 ug via INTRAVENOUS

## 2018-01-17 MED ORDER — KETAMINE HCL 10 MG/ML IJ SOLN
INTRAMUSCULAR | Status: AC
  Filled 2018-01-17: qty 1

## 2018-01-17 MED ORDER — TRAMADOL HCL 50 MG PO TABS
50.0000 mg | ORAL_TABLET | Freq: Four times a day (QID) | ORAL | Status: DC | PRN
  Administered 2018-01-17 – 2018-01-21 (×9): 50 mg via ORAL
  Filled 2018-01-17 (×9): qty 1

## 2018-01-17 MED ORDER — SUGAMMADEX SODIUM 200 MG/2ML IV SOLN
INTRAVENOUS | Status: DC | PRN
  Administered 2018-01-17: 200 mg via INTRAVENOUS

## 2018-01-17 MED ORDER — CHLORHEXIDINE GLUCONATE CLOTH 2 % EX PADS: 6.0000 | MEDICATED_PAD | Freq: Once | CUTANEOUS | Status: DC

## 2018-01-17 MED ORDER — METHYLENE BLUE 0.5 % INJ SOLN
INTRAVENOUS | Status: AC
  Filled 2018-01-17: qty 10

## 2018-01-17 MED ORDER — BUPIVACAINE-EPINEPHRINE (PF) 0.25% -1:200000 IJ SOLN
INTRAMUSCULAR | Status: AC
  Filled 2018-01-17: qty 30

## 2018-01-17 MED ORDER — SUGAMMADEX SODIUM 200 MG/2ML IV SOLN
INTRAVENOUS | Status: AC
  Filled 2018-01-17: qty 2

## 2018-01-17 MED ORDER — BUPIVACAINE-EPINEPHRINE (PF) 0.25% -1:200000 IJ SOLN
INTRAMUSCULAR | Status: DC | PRN
  Administered 2018-01-17: 30 mL via PERINEURAL

## 2018-01-17 MED ORDER — INDOCYANINE GREEN 25 MG IV SOLR
INTRAVENOUS | Status: DC | PRN
  Administered 2018-01-17: 2.5 mg via INTRAVENOUS

## 2018-01-17 MED ORDER — ALVIMOPAN 12 MG PO CAPS: 12.0000 mg | ORAL_CAPSULE | Freq: Two times a day (BID) | ORAL | Status: DC

## 2018-01-17 MED ORDER — 0.9 % SODIUM CHLORIDE (POUR BTL) OPTIME
TOPICAL | Status: DC | PRN
  Administered 2018-01-17: 2000 mL

## 2018-01-17 MED ORDER — ROCURONIUM BROMIDE 10 MG/ML (PF) SYRINGE
PREFILLED_SYRINGE | INTRAVENOUS | Status: AC
  Filled 2018-01-17: qty 10

## 2018-01-17 MED ORDER — PROPOFOL 10 MG/ML IV BOLUS
INTRAVENOUS | Status: AC
  Filled 2018-01-17: qty 20

## 2018-01-17 MED ORDER — HYDROMORPHONE HCL 1 MG/ML IJ SOLN
0.5000 mg | INTRAMUSCULAR | Status: DC | PRN
  Administered 2018-01-17: 0.5 mg via INTRAVENOUS
  Filled 2018-01-17: qty 0.5

## 2018-01-17 MED ORDER — SODIUM CHLORIDE 0.9 % IV SOLN
2.0000 g | INTRAVENOUS | Status: AC
  Administered 2018-01-17: 2 g via INTRAVENOUS
  Filled 2018-01-17: qty 2

## 2018-01-17 MED ORDER — LIDOCAINE 2% (20 MG/ML) 5 ML SYRINGE
INTRAMUSCULAR | Status: DC | PRN
  Administered 2018-01-17: 75 mg via INTRAVENOUS

## 2018-01-17 MED ORDER — METRONIDAZOLE 500 MG PO TABS
1000.0000 mg | ORAL_TABLET | ORAL | Status: DC
  Filled 2018-01-17: qty 2

## 2018-01-17 MED ORDER — HEPARIN SODIUM (PORCINE) 5000 UNIT/ML IJ SOLN
5000.0000 [IU] | Freq: Once | INTRAMUSCULAR | Status: AC
  Administered 2018-01-17: 5000 [IU] via SUBCUTANEOUS
  Filled 2018-01-17: qty 1

## 2018-01-17 MED ORDER — MIDAZOLAM HCL 5 MG/5ML IJ SOLN
INTRAMUSCULAR | Status: DC | PRN
  Administered 2018-01-17: 2 mg via INTRAVENOUS

## 2018-01-17 MED ORDER — STERILE WATER FOR IRRIGATION IR SOLN
Status: DC | PRN
  Administered 2018-01-17: 3000 mL

## 2018-01-17 MED ORDER — ACETAMINOPHEN 500 MG PO TABS
1000.0000 mg | ORAL_TABLET | ORAL | Status: AC
  Administered 2018-01-17: 1000 mg via ORAL
  Filled 2018-01-17: qty 2

## 2018-01-17 MED ORDER — ACETAMINOPHEN 500 MG PO TABS
1000.0000 mg | ORAL_TABLET | Freq: Four times a day (QID) | ORAL | Status: DC
  Administered 2018-01-17 – 2018-01-21 (×13): 1000 mg via ORAL
  Filled 2018-01-17 (×13): qty 2

## 2018-01-17 MED ORDER — LACTATED RINGERS IR SOLN
Status: DC | PRN
  Administered 2018-01-17: 1000 mL

## 2018-01-17 MED ORDER — DIPHENHYDRAMINE HCL 12.5 MG/5ML PO ELIX: 12.5000 mg | ORAL_SOLUTION | Freq: Four times a day (QID) | ORAL | Status: DC | PRN

## 2018-01-17 MED ORDER — GABAPENTIN 300 MG PO CAPS
300.0000 mg | ORAL_CAPSULE | ORAL | Status: AC
  Administered 2018-01-17: 300 mg via ORAL
  Filled 2018-01-17: qty 1

## 2018-01-17 MED ORDER — INSULIN ASPART 100 UNIT/ML ~~LOC~~ SOLN
0.0000 [IU] | Freq: Every day | SUBCUTANEOUS | Status: DC
  Administered 2018-01-20: 2 [IU] via SUBCUTANEOUS

## 2018-01-17 MED ORDER — LACTATED RINGERS IV SOLN
INTRAVENOUS | Status: DC | PRN
  Administered 2018-01-17 (×4): via INTRAVENOUS

## 2018-01-17 SURGICAL SUPPLY — 114 items
APPLIER CLIP 5 13 M/L LIGAMAX5 (MISCELLANEOUS)
APPLIER CLIP ROT 10 11.4 M/L (STAPLE)
BAG URO CATCHER STRL LF (MISCELLANEOUS) ×5 IMPLANT
BLADE EXTENDED COATED 6.5IN (ELECTRODE) ×5 IMPLANT
CANNULA REDUC XI 12-8 STAPL (CANNULA) ×1
CANNULA REDUCER 12-8 DVNC XI (CANNULA) ×4 IMPLANT
CATH INTERMIT  6FR 70CM (CATHETERS) ×6 IMPLANT
CELLS DAT CNTRL 66122 CELL SVR (MISCELLANEOUS) IMPLANT
CHLORAPREP W/TINT 26ML (MISCELLANEOUS) ×4 IMPLANT
CLIP APPLIE 5 13 M/L LIGAMAX5 (MISCELLANEOUS) IMPLANT
CLIP APPLIE ROT 10 11.4 M/L (STAPLE) IMPLANT
CLIP VESOLOCK LG 6/CT PURPLE (CLIP) IMPLANT
CLIP VESOLOCK MED LG 6/CT (CLIP) IMPLANT
CLOTH BEACON ORANGE TIMEOUT ST (SAFETY) ×5 IMPLANT
COVER BACK TABLE 60X90IN (DRAPES) ×1 IMPLANT
COVER SURGICAL LIGHT HANDLE (MISCELLANEOUS) ×10 IMPLANT
COVER TIP SHEARS 8 DVNC (MISCELLANEOUS) ×4 IMPLANT
COVER TIP SHEARS 8MM DA VINCI (MISCELLANEOUS) ×1
COVER WAND RF STERILE (DRAPES) ×1 IMPLANT
DECANTER SPIKE VIAL GLASS SM (MISCELLANEOUS) ×4 IMPLANT
DERMABOND ADVANCED (GAUZE/BANDAGES/DRESSINGS) ×1
DERMABOND ADVANCED .7 DNX12 (GAUZE/BANDAGES/DRESSINGS) IMPLANT
DEVICE TROCAR PUNCTURE CLOSURE (ENDOMECHANICALS) ×1 IMPLANT
DRAIN CHANNEL 19F RND (DRAIN) ×4 IMPLANT
DRAPE ARM DVNC X/XI (DISPOSABLE) ×16 IMPLANT
DRAPE COLUMN DVNC XI (DISPOSABLE) ×4 IMPLANT
DRAPE DA VINCI XI ARM (DISPOSABLE) ×4
DRAPE DA VINCI XI COLUMN (DISPOSABLE) ×1
DRAPE SURG IRRIG POUCH 19X23 (DRAPES) ×5 IMPLANT
DRSG OPSITE POSTOP 4X10 (GAUZE/BANDAGES/DRESSINGS) IMPLANT
DRSG OPSITE POSTOP 4X6 (GAUZE/BANDAGES/DRESSINGS) IMPLANT
DRSG OPSITE POSTOP 4X8 (GAUZE/BANDAGES/DRESSINGS) IMPLANT
DRSG TEGADERM 2-3/8X2-3/4 SM (GAUZE/BANDAGES/DRESSINGS) ×20 IMPLANT
DRSG TEGADERM 4X4.75 (GAUZE/BANDAGES/DRESSINGS) ×4 IMPLANT
ELECT PENCIL ROCKER SW 15FT (MISCELLANEOUS) ×5 IMPLANT
ELECT REM PT RETURN 15FT ADLT (MISCELLANEOUS) ×5 IMPLANT
ENDOLOOP SUT PDS II  0 18 (SUTURE)
ENDOLOOP SUT PDS II 0 18 (SUTURE) IMPLANT
EVACUATOR SILICONE 100CC (DRAIN) ×4 IMPLANT
GAUZE SPONGE 2X2 8PLY STRL LF (GAUZE/BANDAGES/DRESSINGS) ×4 IMPLANT
GAUZE SPONGE 4X4 12PLY STRL (GAUZE/BANDAGES/DRESSINGS) IMPLANT
GLOVE BIO SURGEON STRL SZ7.5 (GLOVE) ×15 IMPLANT
GLOVE BIOGEL M 8.0 STRL (GLOVE) ×5 IMPLANT
GLOVE INDICATOR 8.0 STRL GRN (GLOVE) ×15 IMPLANT
GOWN STRL REUS W/ TWL XL LVL3 (GOWN DISPOSABLE) ×4 IMPLANT
GOWN STRL REUS W/TWL XL LVL3 (GOWN DISPOSABLE) ×26 IMPLANT
GRASPER SUT TROCAR 14GX15 (MISCELLANEOUS) ×5 IMPLANT
GUIDEWIRE STR DUAL SENSOR (WIRE) ×5 IMPLANT
HOLDER FOLEY CATH W/STRAP (MISCELLANEOUS) ×5 IMPLANT
IRRIG SUCT STRYKERFLOW 2 WTIP (MISCELLANEOUS) ×5
IRRIGATION SUCT STRKRFLW 2 WTP (MISCELLANEOUS) IMPLANT
KIT PROCEDURE DA VINCI SI (MISCELLANEOUS) ×2
KIT PROCEDURE DVNC SI (MISCELLANEOUS) ×4 IMPLANT
MANIFOLD NEPTUNE II (INSTRUMENTS) ×5 IMPLANT
NDL INSUFFLATION 14GA 120MM (NEEDLE) ×4 IMPLANT
NEEDLE INSUFFLATION 14GA 120MM (NEEDLE) ×5 IMPLANT
PACK CARDIOVASCULAR III (CUSTOM PROCEDURE TRAY) ×4 IMPLANT
PACK COLON (CUSTOM PROCEDURE TRAY) ×5 IMPLANT
PACK CYSTO (CUSTOM PROCEDURE TRAY) ×5 IMPLANT
PAD POSITIONING PINK XL (MISCELLANEOUS) ×5 IMPLANT
PORT LAP GEL ALEXIS MED 5-9CM (MISCELLANEOUS) ×5 IMPLANT
PROTECTOR NERVE ULNAR (MISCELLANEOUS) ×9 IMPLANT
RELOAD STAPLE 45 BLU REG DVNC (STAPLE) IMPLANT
RELOAD STAPLE 45 GRN THCK DVNC (STAPLE) IMPLANT
RETRACTOR WND ALEXIS 18 MED (MISCELLANEOUS) IMPLANT
RTRCTR WOUND ALEXIS 18CM MED (MISCELLANEOUS)
SCISSORS LAP 5X35 DISP (ENDOMECHANICALS) ×5 IMPLANT
SEAL CANN UNIV 5-8 DVNC XI (MISCELLANEOUS) ×16 IMPLANT
SEAL XI 5MM-8MM UNIVERSAL (MISCELLANEOUS) ×4
SEALER VESSEL DA VINCI XI (MISCELLANEOUS) ×1
SEALER VESSEL EXT DVNC XI (MISCELLANEOUS) ×4 IMPLANT
SLEEVE ADV FIXATION 5X100MM (TROCAR) IMPLANT
SOLUTION ELECTROLUBE (MISCELLANEOUS) ×5 IMPLANT
SPONGE GAUZE 2X2 STER 10/PKG (GAUZE/BANDAGES/DRESSINGS)
STAPLER 45 BLU RELOAD XI (STAPLE) IMPLANT
STAPLER 45 BLUE RELOAD XI (STAPLE)
STAPLER 45 GREEN RELOAD XI (STAPLE) ×2
STAPLER 45 GRN RELOAD XI (STAPLE) ×8 IMPLANT
STAPLER CANNULA SEAL DVNC XI (STAPLE) ×4 IMPLANT
STAPLER CANNULA SEAL XI (STAPLE) ×1
STAPLER CIRC CVD 29MM 37CM (STAPLE) ×1 IMPLANT
STAPLER SHEATH (SHEATH) ×1
STAPLER SHEATH ENDOWRIST DVNC (SHEATH) ×4 IMPLANT
SURGILUBE 2OZ TUBE FLIPTOP (MISCELLANEOUS) ×5 IMPLANT
SUT MNCRL AB 4-0 PS2 18 (SUTURE) ×6 IMPLANT
SUT PDS AB 1 CT1 27 (SUTURE) ×2 IMPLANT
SUT PDS AB 1 CTX 36 (SUTURE) IMPLANT
SUT PDS AB 1 TP1 96 (SUTURE) IMPLANT
SUT PROLENE 0 CT 2 (SUTURE) IMPLANT
SUT PROLENE 2 0 KS (SUTURE) ×1 IMPLANT
SUT PROLENE 2 0 SH DA (SUTURE) IMPLANT
SUT SILK 2 0 (SUTURE) ×1
SUT SILK 2 0 SH CR/8 (SUTURE) ×1 IMPLANT
SUT SILK 2-0 18XBRD TIE 12 (SUTURE) IMPLANT
SUT SILK 3 0 (SUTURE) ×1
SUT SILK 3 0 SH CR/8 (SUTURE) ×5 IMPLANT
SUT SILK 3-0 18XBRD TIE 12 (SUTURE) ×4 IMPLANT
SUT V-LOC BARB 180 2/0GR6 GS22 (SUTURE)
SUT VIC AB 2-0 SH 27 (SUTURE) ×1
SUT VIC AB 2-0 SH 27X BRD (SUTURE) IMPLANT
SUT VIC AB 3-0 SH 18 (SUTURE) IMPLANT
SUT VIC AB 3-0 SH 27 (SUTURE)
SUT VIC AB 3-0 SH 27XBRD (SUTURE) IMPLANT
SUT VICRYL 0 UR6 27IN ABS (SUTURE) ×5 IMPLANT
SUTURE V-LC BRB 180 2/0GR6GS22 (SUTURE) IMPLANT
SYR 10ML LL (SYRINGE) ×5 IMPLANT
SYS LAPSCP GELPORT 120MM (MISCELLANEOUS)
SYSTEM LAPSCP GELPORT 120MM (MISCELLANEOUS) IMPLANT
TAPE UMBILICAL COTTON 1/8X30 (MISCELLANEOUS) ×5 IMPLANT
TOWEL OR NON WOVEN STRL DISP B (DISPOSABLE) ×5 IMPLANT
TRAY FOLEY MTR SLVR 16FR STAT (SET/KITS/TRAYS/PACK) ×5 IMPLANT
TROCAR ADV FIXATION 5X100MM (TROCAR) ×5 IMPLANT
TUBING CONNECTING 10 (TUBING) ×15 IMPLANT
TUBING INSUFFLATION 10FT LAP (TUBING) ×5 IMPLANT

## 2018-01-17 NOTE — H&P (Signed)
CC: Here today with surgery - rectal cancer s/p total neoadjuvant therapy  HPI: Charles [name] is very pleasant 53 year old gentleman with history of HTN, HLD,  DM here for surgery for his rectal cancer. He underwent a colonoscopy with Dr. Michail Sermon 05/24/17 which demonstrated a malignant tumor in the rectosigmoid colon at 12 cm proximal to the anus. Biopsied and tattooed. He also had a polyp in the cecum removed with a hot snare. Internal hemorrhoids. Pathology returned invasive poorly differentiated adenoCA of the rectal mass; TA in cecum. This colonoscopy was done for the purposes of rectal bleeding.He had had no prior colonoscopy  He tolerated systemic therapy reasonably well.  CT C/A/P 06/06/17 demonstrated an eccentric hyperenhancing upper left rectal 2.9 cm mass. 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 No definite additional sites of metastatic disease. Solitary sclerotic lesion in the superior right iliac crest nonspecific.  MRI Pelvis with/without contrast 06/06/17 Demonstrated rectal adenocarcinoma T3c N1, 5.3 cm from the anal sphincter by MRI - interpreted as mid rectum  CEA 0.7  Completed chemoradiation - 5.5 weeks - finished 11/22/17. Since completing neoadjuvant FOLFOX, he has developed numbness in his hands and feet. He also reports some proprioceptive issues in his feet. He denies any blood in his stool states this has resolved. He denies any fever/chills. He has been tolerating a diet without issues. He has managed to get his weight 200 pounds and is working to lose an additional 20 pounds. He denies any issues with incontinence to gas, liquid stool, or solid stool   PMH: HTN, HLD, DM (on Victoza and insulin)  PSH: Open appendectomy via RLQ incision; right achilles; open inguinal hernia repair as infant x2  FHx: Aunt had lymphoma; Mother had cervical cancer; paternal family history  unknown  Social: Denies use of tobacco/drugs; social EtOH  ROS: A comprehensive 10 system review of systems was completed with the patient and pertinent findings as noted above.   Past Medical History:  Diagnosis Date  . Diabetes mellitus without complication ([place])    Type II  . Family history of colon cancer   . Family history of prostate cancer   . Family history of stomach cancer   . Family history of uterine cancer   . History of chemotherapy LAST CHEMO NOV 2019  . Hyperlipidemia   . Neuropathy    FEET AND HANDS PAIN IN LEGS AT TIMES    Past Surgical History:  Procedure Laterality Date  . ACHILLES TENDON REPAIR Right 2005  . APPENDECTOMY    . HERNIA REPAIR     as a baby  . PORTACATH PLACEMENT N/A 06/17/2017   Procedure: RIGHT VS LEFT INTERNAL JUGULAR PORT-A-CATH PLACEMENT WITH ULTRASOUND;  Surgeon: Ileana Roup, MD;  Location: [facility];  Service: General;  Laterality: N/A;  FLURO    Family History  Problem Relation Age of Onset  . Hypertension Mother   . Hyperlipidemia Mother   . Non-Hodgkin's lymphoma Maternal Aunt 39  . Heart disease Maternal Grandmother   . Uterine cancer Maternal Grandmother        dx under 66  . Cirrhosis Maternal Grandfather   . Colon cancer Other        MGF sister  . Stomach cancer Other        MGMs brother  . Prostate cancer Other        MGMs brother  . Prostate cancer Cousin        mother's mat frist  cousin    Social:  reports that he has never smoked. He has never used smokeless tobacco. He reports current alcohol use. He reports that he does not use drugs.  Allergies:  Allergies  Allergen Reactions  . Cefradine [Cephradine]     Gi upset     Medications: I have reviewed the patient's current medications.  Results for orders placed or performed during the hospital encounter of 01/17/18 (from the past 48 hour(s))  Glucose, capillary     Status: Abnormal   Collection Time: 01/17/18  5:58 AM  Result Value Ref Range    Glucose-Capillary 244 (H) 70 - 99 mg/dL   Comment 1 Notify RN    Comment 2 Document in Chart     No results found.  ROS - all of the below systems have been reviewed with the patient and positives are indicated with bold text General: chills, fever or night sweats Eyes: blurry vision or double vision ENT: epistaxis or sore throat Allergy/Immunology: itchy/watery eyes or nasal congestion Hematologic/Lymphatic: bleeding problems, blood clots or swollen lymph nodes Endocrine: temperature intolerance or unexpected weight changes Breast: new or changing breast lumps or nipple discharge Resp: cough, shortness of breath, or wheezing CV: chest pain or dyspnea on exertion GI: as per HPI GU: dysuria, trouble voiding, or hematuria MSK: joint pain or joint stiffness Neuro: TIA or stroke symptoms Derm: pruritus and skin lesion changes Psych: anxiety and depression  PE Blood pressure 127/75, pulse (!) 103, temperature 97.9 F (36.6 C), temperature source Oral, resp. rate 18, height 5\' 8"  (1.727 m), weight 90.7 kg, SpO2 100 %. Constitutional: NAD; conversant; no deformities Eyes: Moist conjunctiva; no lid lag; anicteric; PERRL Neck: Trachea midline; no thyromegaly Lungs: Normal respiratory effort; no tactile fremitus CV: RRR; no palpable thrills; no pitting edema GI: Abd soft, NT/ND; stoma site marked; no palpable hepatosplenomegaly MSK: Normal gait; no clubbing/cyanosis Psychiatric: Appropriate affect; alert and oriented x3 Lymphatic: No palpable cervical or axillary lymphadenopathy  Results for orders placed or performed during the hospital encounter of 01/17/18 (from the past 48 hour(s))  Glucose, capillary     Status: Abnormal   Collection Time: 01/17/18  5:58 AM  Result Value Ref Range   Glucose-Capillary 244 (H) 70 - 99 mg/dL   Comment 1 Notify RN    Comment 2 Document in Chart     A/P: Charles [name] is an 53 y.o. male with with history of HTN, HLD, DM here for surgery for his  rectal cancer -We discussed at length today the anatomy physiology of the GI tract again today. We discussed the pathophysiology of rectal cancer. -We discussed robotic/laparoscopic and open techniques; low anterior resection; possible diverting loop ileostomy. We discussed need for cysto/stents by urology given pelvic XRT. We discussed expectations following surgery. I answered all his and his wife's questions. -The planned procedure, material risks (including, but not limited to, pain, bleeding, infection, scarring, need for blood transfusion, damage to surrounding structures- blood vessels/nerves/viscus/organs, damage to ureter, urine leak, leak from anastomosis, need for additional procedures, need for stoma which may be permanent, low anterior resection syndrome, erectile dysfunction, retrograde ejaculation, hernia, recurrence, pneumonia, heart attack, stroke, death) benefits and alternatives to surgery were discussed at length. The patient's questions were answered to his satisfaction, he voiced understanding and elected to proceed with surgery. -Additionally, we discussed that this case will have a robotic proctor present as I am early on in my robotic experience at this institution. He voiced understanding and agreement with proceeding  Sharon Mt. Dema Severin, M.D. General and Colorectal Surgery [facility]

## 2018-01-17 NOTE — Anesthesia Procedure Notes (Signed)
Procedure Name: Intubation Date/Time: 01/17/2018 7:42 AM Performed by: Deysy Schabel D, CRNA Pre-anesthesia Checklist: Patient identified, Emergency Drugs available, Suction available and Patient being monitored Patient Re-evaluated:Patient Re-evaluated prior to induction Oxygen Delivery Method: Circle system utilized Preoxygenation: Pre-oxygenation with 100% oxygen Induction Type: IV induction Ventilation: Mask ventilation without difficulty Laryngoscope Size: Mac and 4 Grade View: Grade I Tube type: Oral Tube size: 7.5 mm Number of attempts: 1 Airway Equipment and Method: Stylet Placement Confirmation: ETT inserted through vocal cords under direct vision,  positive ETCO2 and breath sounds checked- equal and bilateral Secured at: 22 cm Tube secured with: Tape Dental Injury: Teeth and Oropharynx as per pre-operative assessment

## 2018-01-17 NOTE — Op Note (Signed)
PATIENT: Charles [name]  53 y.o. male  Patient Care Team: Lujean Amel, MD as PCP - General (Family Medicine) Ileana Roup, MD as Consulting Physician (General Surgery)  PREOP DIAGNOSIS: Rectal cancer  POSTOP DIAGNOSIS: Rectal cancer  PROCEDURE:  1. Robotic assisted low anterior resection with double stapled coloproctostomy 2. Flexible sigmoidoscopy 3. Bilateral transversus abdominus plane blocks  SURGEON: Sharon Mt. Dema Severin, MD  ASSISTANT: Leighton Ruff, MD  ANESTHESIA: General endotracheal  EBL: 100 cc Total I/O In: 2000 [I.V.:2000] Out: 275 [Urine:175; Blood:100]  DRAINS: None  SPECIMEN: Proximal rectum and sigmoid colon  COUNTS: Sponge, needle and instrument counts were reported correct x2  FINDINGS: No visible metastatic disease on visceral parietal peritoneum or liver. Robotic assisted low anterior resection with double stapled coloproctostomy - anastomosis is located 10 cm from the anal verge by flexible sigmoidoscopy. ICG was used to further evaluate perfusion of the remnant sigmoid and the rectal stump - both exhibited strong fluorescence with good perfusion. Anastomosis was evaluated and noted to be pink, tension free, air tight and hemostatic. Tissues of good quality. Given all these findings, the decision was made to not perform a diverting loop ileostomy.  STATEMENT OF MEDICAL NECESSITY: Charles [name] is a 53 y.o. male with a history of HTN, HLD, DM whom was diagnosed with a locally advanced (cT3N1M0) upper rectal cancer 05/24/17. His case was presented at our multidisciplinary tumor board conference and the consensus decision was made to proceed with total neoadjuvant chemotherapy (TNT) - with chemotherapy initially, followed by chemoXRT. He completed chemoXRT 11/22/17. Throughout his treatment he was followed in the office and discussions had moving forward. We discussed all potential scenarios including partial and complete response to chemo+chemoXRT. He  opted to undergo surgical resection. Please refer to H&P for details regarding this discussion.  NARRATIVE: Informed consent was verified. The patient was taken to the operating room where a pink pad was in place, placed supine on the operating table and SCD's were applied. General endotracheal anesthesia was induced. The patient was then positioned in the lithotomy position with Allen stirrups. All pressure points were then padded and re-evaluated. Urology then scrubbed; the patient was prepped and draped for placement of ureteral stents and cystoscopy.  Antibiotics had been administered.  Cystoscopy/stents was then performed - please refer to Dr. Simone Curia dictation for details regarding this portion of the procedure. The patient's abdomen was then prepped and draped in the standard sterile fashion for the abdominal portion of the procedure. Surgical timeout confirmed our plan.   An OG was in place and to suction. A Veress needle was then inserted in the left upper quadrant at Palmer's point. Intraperitoneal location was then confirmed with aspiration and a saline drop test. Insufflation commenced to 54mmHg of CO2. An 59mm trocar was then carefully placed in the right upper quadrant and laparoscopic evluation revealed no evidence of injury. The Veress needle was removed.  Three additional 44mm ports were carefully placed under direct laparoscopic visualization along the right abdomen. Two 41mm assistant ports were also placed in the right hemiabdomen.   The patient was then positioned in Trendelenburg. Bilateral transversus abdominus plane blocks were then performed with a dilute mixture of Marcaine/epi + Exparel. The omentum and small bowel was reflected cephalad out of the pelvis. The rectum was inspected and the tattoo identified in the rectum cephalad to the peritoneal reflection. The robot was then docked.  A medial to lateral approach was then utilized.  Sigmoidal attachments to the intersigmoid fossa  were freed  so as to mobilize the sigmoid colon.  The rectosigmoid colon was grasped and elevated anteriorly.  The peritoneum overlying the sacral promontory was then incised.  The avascular plane underneath the proximal mesorectum was developed cephalad and caudad.  The hypogastric nerve bundle and bifurcation was identified and swept "down," preserving these nerve during the dissection.  Working cephalad, the IMA pedicle was identified.  The peritoneum overlying this was incised.  The IMA was then circumferentially dissected approximately 1 cm from its takeoff from the aorta.  The IMV was identified just cephalad to this within a centimeter of the IMA pedicle.  This bundle was relatively close together, likely from neoadjuvant chemoradiation.  Just lateral to the IMA, the left ureter was identified with a stent in place.  This was swept "down" protecting it from injury during the dissection.  Just lateral to this, the gonadal vessels were identified.  The plane was developed further, working cephalad and behind the descending colon but in front of the left kidney.  Attention was then turned back to the IMA pedicle.  This was circumferentially dissected and again the left ureter was confirmed to be down and away from the dissection.  The IMA was then ligated using the vessel sealer.  The stump was then inspected and noted to be hemostatic.  Given the proximity of the IMV to the IMA and the subsequent tethering of the mesentery that would ensue, the decision was made to take the IMV.  Again prior to doing this, the left ureter was confirmed to be dissected and away from our field.  The IMV was then ligated using the vessel sealer.  The stump was inspected and noted to be hemostatic.  The medial to lateral approach continued working cephalad.  The descending colon mesentery was reflected anteriorly and the plane anterior to the kidney was developed within the retroperitoneum.  Following this, the descending colon was  mobilized along the Nehemiah Montee line of Toldt.  Most of the descending colon had been mobilized up to the splenic flexure.  Attention was then turned to the proctectomy portion of the operation.  Working back down at the rectosigmoid colon, the total mesorectal excision plane was developed.  The fascia propria of the rectum was dissected from the underlying presacral fascia sharply.  Beginning with the posterior dissection first, this plane was developed below the level of the tattoo on the rectum.  Following this, the dissection commenced on the right and left lateral sides, following the plane developed posteriorly.  During the most cephalad portion of the dissection, both the left and right ureters were identified and protected.  After completing this, the anterior dissection was done last.  The peritoneum was incised and the dissection commenced within the TME plane -again preserving the dissection anterior to the fascia propria of the rectum.  Denonvillier's fascia overlying the urogenital structures was identified and preserved.  Both seminal vesicles and the urinary bladder were identified and protected free of injury.  At this point, a circumferential tumor-specific mesorectal excision had been performed.  I then went below and passed the flexible sigmoidoscope after clamping the sigmoid colon with a bowel grasper.  Flexible sigmoidoscopy was then performed and demonstrated that the tattoo was distal to the rectal cancer.  An approximately 1 to 1.5 cm lesion was identified proximal to the tattoo consistent with his rectal cancer and a favorable treatment response.  The distal portion of the tattoo was well over 2 cm below the mass.  Given the location  of the tumor, a 2 cm margin was selected and using laparoscopic guidance, a 2 cm margin was identified.  I then went back to the robot console.  At the location 2 cm distal to the lesion, the mesorectum was cleared using the vessel sealer.  1 of the right lower  quadrant ports was then exchanged for a 12 mm port.  The robotic stapler was then passed and using 2 fires of the robotic green load stapler, the rectum at the location of the cleared mesorectum was divided.  The mesorectum at this location was hemostatic.  The planned point of proximal transection was identified proximal to the IMA pedicle.  The mesentery at this location was then cleared using the vessel sealer out to the level of the planned site of division.  This portion of the proximal sigmoid region of the pelvis to the point of division without any tension and remained there on its own.  Given these findings, full splenic flexure mobilization was not performed.  Attention was turned to performing a perfusion test.  Anesthesia administered ICG.  Using firefly, the proximal point of transection was visualized and had excellent perfusion.  The rectal stump was then examined and also exhibited excellent perfusion.   Attention was then turned to extracorporeal portion of the procedure. The robot was undocked.  A Pfannenstiel incision was made 2 finger breadths above the pubic symphysis and carried down to the anterior rectus fascia. The anterior rectus fascia was then opened transversely.  Kocher clamps were placed on the fascia and the fibroconnective attachments to the anterior rectus fascia were taken down with electrocautery.  Hemostasis was verified.  The midline peritoneum was then incised and the peritoneal cavity entered.  Care was taken to protect the bladder from injury during opening of the peritoneum.  An Garrison wound protector was then placed.  The proximal rectum and sigmoid colon were then eviscerated from the peritoneal cavity.  At the proximal site of transection, pursestring device was applied and a 2-0 Prolene passed through the device using a Keith needle. Tthe colon was divided and the rectum+sigmoid was passed off as specimen. The staple line marks the distal portion of the specimen,  open portion proximal. I then evaluated the specimen on the back table - the lesion was identified in the rectum and there was grossly again a 2 cm margin. Gown/gloves where then changed. EEA sizers were then used and a 29 mm EEA selected. Additional 3-0 silk sutures were used to reinforce the pursestring in a belt loop fashion. The anvil was placed and the pursestring tied.  This was placed back in the abdomen.  A cap was placed on the Alexis wound protector and the peritoneal cavity was reinsufflated with CO2.  My partner then went below to pass the stapler and I stayed above.  Under direct visualization, the 29 mm EEA stapler was then passed into the rectum.  The spike was deployed just anterior to the staple line.  The bladder and urogenital structures were then retracted and well away from our field.  The anvil and spike were then mated.  The descending and sigmoid colon were inspected and noted to be lying in appropriate orientation without twisting of the colon.  There was no bowel underneath the mesenteric defect. The components were mated and closed. Orientation of the proximal limb of colon again confirmed there was no twisting. The two ends reached one another without any tension. Care was taken to ensure no other structures were  included in the staple line. The anastomosis was then created.  We then turned our attention to performing a leak test.  The colon proximal to the anastomosis was occluded and the flexible sigmoidoscope passed. The pelvis was filled with sterile saline. Air insufflation via scope commenced and no bubbles or air leak was identified.  Both ends of the bowel at the level of the anastomosis were pink and hemostatic.  The irrigation was evacuated.  Hemostasis was verified in the pelvis. The IMA and IMV were hemostatic as well. The omentum was brought back down - and reached into the pelvis. The 12 mm trocar was removed and the port site closed with two 0 figure of eight sutures using  the laparoscopic suture passer. The port site was palpated and fascia noted to be completely closed.  The remaining 8 mm trochars were removed under direct visualization and port sites were hemostatic.  The 5 mm assistant ports were also removed under direct visualization and hemostatic.  The Alexis wound protector was then removed from the abdomen.  The abdomen was redraped with sterile drapes.  All equipment was exchanged for clean equipment.  Gloves and gowns of all scrubbed participants were changed. The peritoneum at the Pfannenstiel site was closed with a running 2-0 Vicryl suture.  The rectus fascia was then approximated with running #1 PDS suture. The skin of all incision sites was closed with 4-0 Monocryl subcuticular sutures.  Dermabond was applied to all incisions and a sterile dressing placed over the Pfannenstiel incision. The ureteral stents at the end of the case were then removed and intact and the Foley catheter left in place.   An MD assistant was necessary for tissue manipulation, retraction and positioning due to the complexity of the case, obesity of the patient and hospital policies  DISPOSITION: PACU in satisfactory condition

## 2018-01-17 NOTE — Op Note (Signed)
PATIENT:  Charles [name]  PRE-OPERATIVE DIAGNOSIS: Need for ureteral identification with stents  POST-OPERATIVE DIAGNOSIS: Same  PROCEDURE: 1.  Cystoscopy with bilateral retrograde pyelograms including interpretation. 2.  Bilateral ureteral catheter placement. 3.  Fluoroscopy time less than 1 hour.  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  He is undergone chemoradiation and presents for low anterior resection.  Due to previous radiation and chemotherapy it was felt advisable to place ureteral catheters in order to better aid in identification of the ureters intraoperatively.  I discussed the rationale behind this as well as how this would be performed including its risks and complications with the patient preoperatively.  He understands and is elected to proceed with this portion of the operation.  ANESTHESIA:  General  EBL:  Minimal  DRAINS: 6 [place] open-ended ureteral catheters in the right and left ureter and a 16 French Foley catheter in the bladder  LOCAL MEDICATIONS USED:  None  SPECIMEN: None  Description of procedure: After informed consent the patient was taken to the operating room and placed on the table in a supine position. General anesthesia was then administered. Once fully anesthetized the patient was moved to the dorsal lithotomy position and the genitalia were sterilely prepped and draped in standard fashion. An official timeout was then performed.  The 23 French cystoscope was passed under direct vision down the urethra which was noted to be normal.  The prostatic urethra revealed some slight elongation and lateral lobe hypertrophy but was nonobstructing.  There were no lesions noted.  The bladder was then entered and fully inspected.  I noted no tumors, stones or inflammatory lesions.  His right and left ureteral orifice were noted to be of normal configuration and  position.  A 6 French open-ended ureteral catheter was then passed through the cystoscope and into the right ureteral orifice.  Full-strength Omnipaque contrast was then injected under direct fluoroscopy of the right ureter which revealed no filling defects or mass-effect.  The ureter followed a normal anatomic course to the renal pelvis and collecting system which were noted to be normal as well.  I then passed a 0.038 inch sensor guidewire through the open-ended catheter and up the ureter into the area of the renal pelvis and then passed the open-ended catheter over the guidewire into the renal pelvis removing the guidewire and leaving the catheter in place.  I then proceeded to perform an identical procedure on the left-hand side.  My findings were similar in that the ureter followed a normal anatomic course with no mass-effect or filling defects and the 6 [place] open-ended catheter was then passed into the area of the renal pelvis in an identical fashion as stated above.  I then removed the cystoscope and inserted a 16 French Foley catheter.  The 2 open-ended ureteral catheters were then connected to a Goldberg drainage device as well as the Foley catheter and then this was connected to closed system drainage.  The patient tolerated this portion of the procedure well with no intraoperative complications.  PLAN OF CARE: Discharge to home after PACU  PATIENT DISPOSITION:  PACU - hemodynamically stable.

## 2018-01-17 NOTE — Anesthesia Postprocedure Evaluation (Signed)
Anesthesia Post Note  Patient: Charles [name]  Procedure(s) Performed: XI ROBOT ASSISTED LAPAROSCOPIC  low anterior resection (N/A Abdomen) possible diverting LOOP ILEOSTOMY (N/A ) FLEXIBLE SIGMOIDOSCOPY (N/A Rectum) CYSTOSCOPY WITH STENT PLACEMENT (Bilateral Urethra)     Patient location during evaluation: PACU Anesthesia Type: General Level of consciousness: awake Pain management: pain level controlled Vital Signs Assessment: post-procedure vital signs reviewed and stable Respiratory status: spontaneous breathing Cardiovascular status: stable Postop Assessment: no apparent nausea or vomiting Anesthetic complications: no    Last Vitals:  Vitals:   01/17/18 1445 01/17/18 1500  BP: (!) 156/92 (!) 154/91  Pulse: (!) 103 (!) 103  Resp: 13 14  Temp:  36.7 C  SpO2: 100% 96%    Last Pain:  Vitals:   01/17/18 1500  TempSrc:   PainSc: Asleep                 Dorota Heinrichs

## 2018-01-17 NOTE — Transfer of Care (Signed)
Immediate Anesthesia Transfer of Care Note  Patient: Charles [name]  Procedure(s) Performed: XI ROBOT ASSISTED LAPAROSCOPIC  low anterior resection (N/A Abdomen) possible diverting LOOP ILEOSTOMY (N/A ) FLEXIBLE SIGMOIDOSCOPY (N/A Rectum) CYSTOSCOPY WITH STENT PLACEMENT (Bilateral Urethra)  Patient Location: PACU  Anesthesia Type:General  Level of Consciousness: awake, alert  and oriented  Airway & Oxygen Therapy: Patient Spontanous Breathing and Patient connected to face mask oxygen  Post-op Assessment: Report given to RN and Post -op Vital signs reviewed and stable  Post vital signs: Reviewed and stable  Last Vitals:  Vitals Value Taken Time  BP 148/90 01/17/2018  1:45 PM  Temp    Pulse 102 01/17/2018  1:47 PM  Resp 10 01/17/2018  1:47 PM  SpO2 100 % 01/17/2018  1:47 PM  Vitals shown include unvalidated device data.  Last Pain:  Vitals:   01/17/18 0618  TempSrc:   PainSc: 0-No pain      Patients Stated Pain Goal: 4 (17/79/39 0300)  Complications: No apparent anesthesia complications

## 2018-01-18 ENCOUNTER — Encounter (HOSPITAL_COMMUNITY): Payer: Self-pay | Admitting: Surgery

## 2018-01-18 LAB — GLUCOSE, CAPILLARY
Glucose-Capillary: 183 mg/dL — ABNORMAL HIGH (ref 70–99)
Glucose-Capillary: 173 mg/dL — ABNORMAL HIGH (ref 70–99)
Glucose-Capillary: 174 mg/dL — ABNORMAL HIGH (ref 70–99)
Glucose-Capillary: 175 mg/dL — ABNORMAL HIGH (ref 70–99)

## 2018-01-18 LAB — CBC
HCT: 35.9 % — ABNORMAL LOW (ref 39.0–52.0)
Hemoglobin: 11.8 g/dL — ABNORMAL LOW (ref 13.0–17.0)
MCH: 31.7 pg (ref 26.0–34.0)
MCHC: 32.9 g/dL (ref 30.0–36.0)
MCV: 96.5 fL (ref 80.0–100.0)
PLATELETS: 164 10*3/uL (ref 150–400)
RBC: 3.72 MIL/uL — ABNORMAL LOW (ref 4.22–5.81)
RDW: 12.4 % (ref 11.5–15.5)
WBC: 6.6 10*3/uL (ref 4.0–10.5)
nRBC: 0 % (ref 0.0–0.2)

## 2018-01-18 LAB — PHOSPHORUS: Phosphorus: 3.8 mg/dL (ref 2.5–4.6)

## 2018-01-18 LAB — MAGNESIUM: Magnesium: 2.3 mg/dL (ref 1.7–2.4)

## 2018-01-18 LAB — BASIC METABOLIC PANEL
Anion gap: 6 (ref 5–15)
BUN: 14 mg/dL (ref 6–20)
CO2: 25 mmol/L (ref 22–32)
CREATININE: 0.86 mg/dL (ref 0.61–1.24)
Calcium: 8.5 mg/dL — ABNORMAL LOW (ref 8.9–10.3)
Chloride: 109 mmol/L (ref 98–111)
GFR calc Af Amer: >60 mL/min (ref >60)
GFR calc non Af Amer: >60 mL/min (ref >60)
Glucose, Bld: 190 mg/dL — ABNORMAL HIGH (ref 70–99)
Potassium: 3.8 mmol/L (ref 3.5–5.1)
Sodium: 140 mmol/L (ref 135–145)

## 2018-01-18 NOTE — Evaluation (Signed)
Occupational Therapy Evaluation Patient Details Name: Charles [name] MRN: [id number] DOB: 28-Dec-1965 Today's Date: 01/18/2018    History of Present Illness s/p robot assisted laparoscopic lower anterior resection, cystoscopy with stent placement.  H/o rectal CA, s/p XRT and chemo   Clinical Impression   This 53 year old man was admitted for the above. He was independent with adls prior to admission and is limited at this time due to incisional pain.  Educated on AE.  Pt will benefit from 3:1 to go over his standard commode at home. No further OT needs.    Follow Up Recommendations  No OT follow up    Equipment Recommendations  3 in 1 bedside commode    Recommendations for Other Services       Precautions / Restrictions Precautions Precautions: Fall Precaution Comments: peripheral neuropathy Restrictions Weight Bearing Restrictions: No      Mobility Bed Mobility               General bed mobility comments: oob; educated on rolling/sidelying to sit from flat bed  Transfers Overall transfer level: Needs assistance Equipment used: None Transfers: Sit to/from Stand Sit to Stand: Supervision              Balance                                           ADL either performed or assessed with clinical judgement   ADL Overall ADL's : Needs assistance/impaired             Lower Body Bathing: Supervison/ safety;Sit to/from stand;With adaptive equipment       Lower Body Dressing: Moderate assistance;Sit to/from stand;With adaptive equipment   Toilet Transfer: Min guard;Ambulation(iv pole)   Toileting- Clothing Manipulation and Hygiene: Supervision/safety;Sit to/from stand         General ADL Comments: pt can perform UB adls with set up. Educated on AE for adls as pt cannot reach forward due to pain from sx. He is able to cross LLE but not right.  Pt reports strain getting up from our comfort height commode. He will benefit from 3:1  over toilet. Educated on side stepping into tub when cleared by MD.  Venita Lick on button hook as he has difficulty with these, but did not have one to show him/practice with     Vision         Perception     Praxis      Pertinent Vitals/Pain Pain Assessment: Faces Faces Pain Scale: Hurts little more Pain Location: incision Pain Descriptors / Indicators: Sore Pain Intervention(s): Limited activity within patient's tolerance;Monitored during session;Repositioned     Hand Dominance     Extremity/Trunk Assessment Upper Extremity Assessment Upper Extremity Assessment: Overall WFL for tasks assessed(perpheral neuropathy)           Communication Communication Communication: No difficulties   Cognition Arousal/Alertness: Awake/alert Behavior During Therapy: WFL for tasks assessed/performed Overall Cognitive Status: Within Functional Limits for tasks assessed                                     General Comments       Exercises     Shoulder Instructions      Home Living Family/patient expects to be discharged to:: Private residence Living Arrangements: Spouse/significant other  Bathroom Shower/Tub: Occupational psychologist: None          Prior Functioning/Environment Level of Independence: Independent        Comments: wife home x 1 week then mom and aunt will help        OT Problem List:        OT Treatment/Interventions:      OT Goals(Current goals can be found in the care plan section) Acute Rehab OT Goals Patient Stated Goal: return to independence OT Goal Formulation: All assessment and education complete, DC therapy  OT Frequency:     Barriers to D/C:            Co-evaluation              AM-PAC OT "6 Clicks" Daily Activity     Outcome Measure Help from another person eating meals?: None Help from another person taking care of personal grooming?: A  Little Help from another person toileting, which includes using toliet, bedpan, or urinal?: A Little Help from another person bathing (including washing, rinsing, drying)?: A Little Help from another person to put on and taking off regular upper body clothing?: A Little Help from another person to put on and taking off regular lower body clothing?: A Lot 6 Click Score: 18   End of Session    Activity Tolerance: Patient tolerated treatment well Patient left: in chair;with call bell/phone within reach;with family/visitor present  OT Visit Diagnosis: Muscle weakness (generalized) (M62.81)                Time: 2119-4174 OT Time Calculation (min): 18 min Charges:  OT General Charges $OT Visit: 1 Visit OT Evaluation $OT Eval Low Complexity: 1 Low  Lesle Chris, OTR/L Acute Rehabilitation Services [phone] [facility] pager [phone] office 01/18/2018  [place] 01/18/2018, 12:20 PM

## 2018-01-18 NOTE — Progress Notes (Signed)
[facility] Office:  [phone] General Surgery Progress Note   LOS: 1 day  POD -  1 Day Post-Op  Chief Complaint: Rectal tumor  Assessment and Plan: 1.  XI ROBOT ASSISTED LAPAROSCOPIC  low anterior resection, FLEXIBLE SIGMOIDOSCOPY CYSTOSCOPY WITH STENT PLACEMENT - White/Ottelin - 01/17/2018  Schooler - GI  On Entereg - stopped because of BM.  Had BM.  On bariatric fulls.  2.  Has foley - to leave in until 01/19/2018 3.  DM  BS - 183 - 01/18/2018 4.  HTN 5.  DVT prophylaxis - sQ Heparin   Active Problems:   Rectal cancer (HCC)  Subjective:  Doing well.  Had 3 BM's.  Pain well controlled.  Objective:   Vitals:   01/18/18 0537 01/18/18 1056  BP: 106/69 116/69  Pulse: 99 87  Resp: 16 16  Temp: 97.6 F (36.4 C) 97.7 F (36.5 C)  SpO2: 100% 100%     Intake/Output from previous day:  01/10 0701 - 01/11 0700 In: 5114.5 [P.O.:490; I.V.:4624.5] Out: 2575 [Urine:2475; Blood:100]  Intake/Output this shift:  Total I/O In: -  Out: 450 [Urine:450]   Physical Exam:   General: AA M who is alert and oriented.    HEENT: Normal. Pupils equal. .   Lungs: IS = 1,400 cc   Abdomen: Soft.   Wound: Look good   Has foley   Lab Results:    Recent Labs    01/18/18 0453  WBC 6.6  HGB 11.8*  HCT 35.9*  PLT 164    BMET   Recent Labs    01/18/18 0453  NA 140  K 3.8  CL 109  CO2 25  GLUCOSE 190*  BUN 14  CREATININE 0.86  CALCIUM 8.5*    PT/INR  No results for input(s): LABPROT, INR in the last 72 hours.  ABG  No results for input(s): PHART, HCO3 in the last 72 hours.  Invalid input(s): PCO2, PO2   Studies/Results:  Dg C-arm 1-60 Min-no Report  Result Date: 01/17/2018 Fluoroscopy was utilized by the requesting physician.  No radiographic interpretation.     Anti-infectives:   Anti-infectives (From admission, onward)   Start     Dose/Rate Route Frequency Ordered Stop   01/17/18 1400  neomycin (MYCIFRADIN) tablet 1,000 mg  Status:   Discontinued     1,000 mg Oral 3 times per day 01/17/18 0537 01/17/18 1528   01/17/18 1400  metroNIDAZOLE (FLAGYL) tablet 1,000 mg  Status:  Discontinued     1,000 mg Oral 3 times per day 01/17/18 0537 01/17/18 1528   01/17/18 0600  cefoTEtan (CEFOTAN) 2 g in sodium chloride 0.9 % 100 mL IVPB     2 g 200 mL/hr over 30 Minutes Intravenous On call to O.R. 01/17/18 0537 01/17/18 0744      Alphonsa Overall, MD, FACS Pager: [facility] Office: [phone] 01/18/2018

## 2018-01-18 NOTE — Progress Notes (Signed)
PT Cancellation Note  Patient Details Name: Charles [name] MRN: [id number] DOB: 06-29-1965   Cancelled Treatment:    Reason Eval/Treat Not Completed: Fatigue/lethargy limiting ability to participate, pt. Reports taking pain meds and cannot practice steps at this time. Follow up tomorrow.    Claretha Cooper 01/18/2018, 3:12 PM [place] Pager [phone] Office [phone]

## 2018-01-19 DIAGNOSIS — G629 Polyneuropathy, unspecified: Secondary | ICD-10-CM

## 2018-01-19 DIAGNOSIS — E785 Hyperlipidemia, unspecified: Secondary | ICD-10-CM | POA: Diagnosis present

## 2018-01-19 DIAGNOSIS — Z794 Long term (current) use of insulin: Secondary | ICD-10-CM

## 2018-01-19 DIAGNOSIS — E119 Type 2 diabetes mellitus without complications: Secondary | ICD-10-CM

## 2018-01-19 LAB — BASIC METABOLIC PANEL
Anion gap: 8 (ref 5–15)
BUN: 12 mg/dL (ref 6–20)
CO2: 24 mmol/L (ref 22–32)
Calcium: 8.5 mg/dL — ABNORMAL LOW (ref 8.9–10.3)
Chloride: 108 mmol/L (ref 98–111)
Creatinine, Ser: 0.82 mg/dL (ref 0.61–1.24)
GFR calc Af Amer: >60 mL/min (ref >60)
GFR calc non Af Amer: >60 mL/min (ref >60)
Glucose, Bld: 159 mg/dL — ABNORMAL HIGH (ref 70–99)
Potassium: 3.6 mmol/L (ref 3.5–5.1)
SODIUM: 140 mmol/L (ref 135–145)

## 2018-01-19 LAB — GLUCOSE, CAPILLARY
Glucose-Capillary: 145 mg/dL — ABNORMAL HIGH (ref 70–99)
Glucose-Capillary: 320 mg/dL — ABNORMAL HIGH (ref 70–99)
Glucose-Capillary: 152 mg/dL — ABNORMAL HIGH (ref 70–99)
Glucose-Capillary: 170 mg/dL — ABNORMAL HIGH (ref 70–99)

## 2018-01-19 LAB — CBC
HCT: 34.3 % — ABNORMAL LOW (ref 39.0–52.0)
Hemoglobin: 11.2 g/dL — ABNORMAL LOW (ref 13.0–17.0)
MCH: 32.2 pg (ref 26.0–34.0)
MCHC: 32.7 g/dL (ref 30.0–36.0)
MCV: 98.6 fL (ref 80.0–100.0)
Platelets: 144 10*3/uL — ABNORMAL LOW (ref 150–400)
RBC: 3.48 MIL/uL — ABNORMAL LOW (ref 4.22–5.81)
RDW: 12.3 % (ref 11.5–15.5)
WBC: 4.2 10*3/uL (ref 4.0–10.5)
nRBC: 0 % (ref 0.0–0.2)

## 2018-01-19 LAB — MAGNESIUM: MAGNESIUM: 2.2 mg/dL (ref 1.7–2.4)

## 2018-01-19 LAB — PHOSPHORUS: Phosphorus: 2.7 mg/dL (ref 2.5–4.6)

## 2018-01-19 MED ORDER — LIRAGLUTIDE 18 MG/3ML ~~LOC~~ SOPN
1.8000 mg | PEN_INJECTOR | Freq: Every day | SUBCUTANEOUS | Status: DC
  Administered 2018-01-21: 1.8 mg via SUBCUTANEOUS

## 2018-01-19 MED ORDER — FLUTICASONE PROPIONATE 50 MCG/ACT NA SUSP: 1.0000 | Freq: Every day | NASAL | Status: DC | PRN

## 2018-01-19 MED ORDER — LACTATED RINGERS IV BOLUS: 1000.0000 mL | Freq: Three times a day (TID) | INTRAVENOUS | Status: DC | PRN

## 2018-01-19 MED ORDER — GUAIFENESIN-DM 100-10 MG/5ML PO SYRP: 10.0000 mL | ORAL_SOLUTION | ORAL | Status: DC | PRN

## 2018-01-19 MED ORDER — PHENOL 1.4 % MT LIQD: 1.0000 | OROMUCOSAL | Status: DC | PRN

## 2018-01-19 MED ORDER — LORATADINE 10 MG PO TABS
10.0000 mg | ORAL_TABLET | Freq: Every day | ORAL | Status: DC
  Administered 2018-01-19 – 2018-01-21 (×3): 10 mg via ORAL
  Filled 2018-01-19 (×3): qty 1

## 2018-01-19 MED ORDER — METHOCARBAMOL 1000 MG/10ML IJ SOLN
1000.0000 mg | Freq: Four times a day (QID) | INTRAVENOUS | Status: DC | PRN
  Filled 2018-01-19: qty 10

## 2018-01-19 MED ORDER — ENSURE SURGERY PO LIQD
237.0000 mL | Freq: Two times a day (BID) | ORAL | Status: DC
  Administered 2018-01-20 – 2018-01-21 (×2): 237 mL via ORAL
  Filled 2018-01-19 (×6): qty 237

## 2018-01-19 MED ORDER — METHOCARBAMOL 500 MG PO TABS: 1000.0000 mg | ORAL_TABLET | Freq: Four times a day (QID) | ORAL | Status: DC | PRN

## 2018-01-19 MED ORDER — DULOXETINE HCL 30 MG PO CPEP
30.0000 mg | ORAL_CAPSULE | Freq: Every day | ORAL | Status: DC
  Administered 2018-01-19 – 2018-01-21 (×3): 30 mg via ORAL
  Filled 2018-01-19 (×3): qty 1

## 2018-01-19 MED ORDER — CANAGLIFLOZIN 100 MG PO TABS
100.0000 mg | ORAL_TABLET | Freq: Every day | ORAL | Status: DC
  Administered 2018-01-19 – 2018-01-20 (×2): 100 mg via ORAL
  Filled 2018-01-19 (×3): qty 1

## 2018-01-19 MED ORDER — LATANOPROST 0.005 % OP SOLN
1.0000 [drp] | Freq: Every day | OPHTHALMIC | Status: DC
  Administered 2018-01-20: 1 [drp] via OPHTHALMIC
  Filled 2018-01-19: qty 2.5

## 2018-01-19 MED ORDER — HYDROCORTISONE 2.5 % RE CREA: 1.0000 "application " | TOPICAL_CREAM | Freq: Four times a day (QID) | RECTAL | Status: DC | PRN

## 2018-01-19 MED ORDER — PSYLLIUM 95 % PO PACK
1.0000 | PACK | Freq: Every day | ORAL | Status: DC
  Administered 2018-01-19 – 2018-01-21 (×3): 1 via ORAL
  Filled 2018-01-19 (×3): qty 1

## 2018-01-19 MED ORDER — SODIUM CHLORIDE 0.9 % IV SOLN: 250.0000 mL | INTRAVENOUS | Status: DC | PRN

## 2018-01-19 MED ORDER — SODIUM CHLORIDE 0.9% FLUSH
3.0000 mL | Freq: Two times a day (BID) | INTRAVENOUS | Status: DC
  Administered 2018-01-19 – 2018-01-20 (×4): 3 mL via INTRAVENOUS

## 2018-01-19 MED ORDER — MENTHOL 3 MG MT LOZG: 1.0000 | LOZENGE | OROMUCOSAL | Status: DC | PRN

## 2018-01-19 MED ORDER — ATORVASTATIN CALCIUM 40 MG PO TABS
80.0000 mg | ORAL_TABLET | Freq: Every day | ORAL | Status: DC
  Administered 2018-01-20: 80 mg via ORAL
  Filled 2018-01-19 (×2): qty 2

## 2018-01-19 MED ORDER — HYDROCORTISONE 1 % EX CREA: 1.0000 "application " | TOPICAL_CREAM | Freq: Three times a day (TID) | CUTANEOUS | Status: DC | PRN

## 2018-01-19 MED ORDER — SODIUM CHLORIDE 0.9% FLUSH: 3.0000 mL | INTRAVENOUS | Status: DC | PRN

## 2018-01-19 NOTE — Progress Notes (Signed)
PT Cancellation Note  Patient Details Name: Charles [name] MRN: [id number] DOB: [birth year]   Cancelled Treatment:    Reason Eval/Treat Not Completed: Attempted PT eval-spoke with family who declined participation on today. Will check back another day.    Weston Anna, PT Acute Rehabilitation Services Pager: [phone] Office: [phone]

## 2018-01-19 NOTE — Progress Notes (Signed)
Charles [name] [id number] April 15, 1965  CARE TEAM:  PCP: Lujean Amel, MD  Outpatient Care Team: Patient Care Team: Lujean Amel, MD as PCP - General (Family Medicine) Ileana Roup, MD as Consulting Physician (General Surgery)  Inpatient Treatment Team: Treatment Team: Attending Provider: Ileana Roup, MD; Technician: Orson Aloe, NT; Physical Therapist: Alphonzo Severance, PT   Problem List:   Principal Problem:   Rectal cancer s/p robotic LAR rectosigmoid resection 01/17/2018 Active Problems:   Diabetes mellitus without complication ([place])   Hyperlipidemia   Neuropathy   2 Days Post-Op  01/17/2018  POSTOP DIAGNOSIS: Rectal cancer  PROCEDURE:  1. Robotic low anterior resection with double stapled coloproctostomy 2. Flexible sigmoidoscopy 3. Bilateral transversus abdominus plane blocks  SURGEON: Sharon Mt. White, MD     Assessment  Recovering status post robotic excision of rectal cancer  [facility] Stay = 2 days)  Plan:  1.  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    DC Foley since postoperative day 2  3.  DM Restart oral hypoglycemics.   Sliding scale insulin for backup. 4.  HTN Medicine control 5.  DVT prophylaxis - sQ Heparin  D/C patient from hospital when patient meets criteria (anticipate in 1-2 day(s)):  Tolerating oral intake well Ambulating well Adequate pain control without IV medications Urinating  Having flatus Disposition planning in place    20 minutes spent in review, evaluation, examination, counseling, and coordination of care.  More than 50% of that time was spent in counseling.  01/19/2018    Subjective: (Chief complaint)  Feeling better overall.  Wife in room.  Hoping to try solid food.  Walking  well.  Pain less.  Objective:  Vital signs:  Vitals:   01/18/18 1056 01/18/18 1431 01/18/18 2046 01/19/18 0637  BP: 116/69 133/77 (!) 142/77 133/72  Pulse: 87 84 86 93  Resp: 16 16 18 18   Temp: 97.7 F (36.5 C) 98.1 F (36.7 C) 99 F (37.2 C) 98.9 F (37.2 C)  TempSrc: Oral Oral Oral Oral  SpO2: 100% 98% 100% 97%  Weight:      Height:        Last BM Date: 01/18/18  Intake/Output   Yesterday:  01/11 0701 - 01/12 0700 In: 3536.3 [P.O.:1800; I.V.:1736.3] Out: 4098 [Urine:3375] This shift:  No intake/output data recorded.  Bowel function:  Flatus: YES  BM:  YES  Drain: (No drain)   Physical Exam:  General: Pt awake/alert/oriented x4 in no acute distress Eyes: PERRL, normal EOM.  Sclera clear.  No icterus Neuro: CN II-XII intact w/o focal sensory/motor deficits. Lymph: No head/neck/groin lymphadenopathy Psych:  No delerium/psychosis/paranoia HENT: Normocephalic, Mucus membranes moist.  No thrush Neck: Supple, No tracheal deviation Chest: No chest wall pain w good excursion CV:  Pulses intact.  Regular rhythm MS: Normal AROM mjr joints.  No obvious deformity  Abdomen: Soft.  Nondistended.  Mildly tender at incisions only.  No evidence of peritonitis.  No incarcerated hernias.  Ext:   No deformity.  No mjr edema.  No cyanosis Skin: No petechiae / purpura  Results:   Labs: Results for orders placed or performed during the hospital encounter of 01/17/18 (from the past 48 hour(s))  Glucose, capillary  Status: Abnormal   Collection Time: 01/17/18  2:24 PM  Result Value Ref Range   Glucose-Capillary 236 (H) 70 - 99 mg/dL  Glucose, capillary     Status: Abnormal   Collection Time: 01/17/18  5:39 PM  Result Value Ref Range   Glucose-Capillary 267 (H) 70 - 99 mg/dL  Glucose, capillary     Status: Abnormal   Collection Time: 01/17/18  9:13 PM  Result Value Ref Range   Glucose-Capillary 200 (H) 70 - 99 mg/dL   Comment 1 Notify RN   Basic metabolic panel      Status: Abnormal   Collection Time: 01/18/18  4:53 AM  Result Value Ref Range   Sodium 140 135 - 145 mmol/L   Potassium 3.8 3.5 - 5.1 mmol/L   Chloride 109 98 - 111 mmol/L   CO2 25 22 - 32 mmol/L   Glucose, Bld 190 (H) 70 - 99 mg/dL   BUN 14 6 - 20 mg/dL   Creatinine, Ser 0.86 0.61 - 1.24 mg/dL   Calcium 8.5 (L) 8.9 - 10.3 mg/dL   GFR calc non Af Amer >60 >60 mL/min   GFR calc Af Amer >60 >60 mL/min   Anion gap 6 5 - 15    Comment: Performed at [facility], [place] [address], [place], [place] [address]  CBC     Status: Abnormal   Collection Time: 01/18/18  4:53 AM  Result Value Ref Range   WBC 6.6 4.0 - 10.5 K/uL   RBC 3.72 (L) 4.22 - 5.81 MIL/uL   Hemoglobin 11.8 (L) 13.0 - 17.0 g/dL   HCT 35.9 (L) 39.0 - 52.0 %   MCV 96.5 80.0 - 100.0 fL   MCH 31.7 26.0 - 34.0 pg   MCHC 32.9 30.0 - 36.0 g/dL   RDW 12.4 11.5 - 15.5 %   Platelets 164 150 - 400 K/uL   nRBC 0.0 0.0 - 0.2 %    Comment: Performed at [facility], [place] [address] [place], [place] [address]  Magnesium     Status: None   Collection Time: 01/18/18  4:53 AM  Result Value Ref Range   Magnesium 2.3 1.7 - 2.4 mg/dL    Comment: Performed at [facility], [place] [address] [place], [place] [address]  Phosphorus     Status: None   Collection Time: 01/18/18  4:53 AM  Result Value Ref Range   Phosphorus 3.8 2.5 - 4.6 mg/dL    Comment: Performed at [facility], [place] [address], [place], [place] [address]  Glucose, capillary     Status: Abnormal   Collection Time: 01/18/18  8:04 AM  Result Value Ref Range   Glucose-Capillary 183 (H) 70 - 99 mg/dL  Glucose, capillary     Status: Abnormal   Collection Time: 01/18/18 12:05 PM  Result Value Ref Range   Glucose-Capillary 173 (H) 70 - 99 mg/dL  Glucose, capillary     Status: Abnormal   Collection Time: 01/18/18  4:15 PM  Result Value Ref Range   Glucose-Capillary 174 (H) 70 - 99 mg/dL  Glucose,  capillary     Status: Abnormal   Collection Time: 01/18/18  9:46 PM  Result Value Ref Range   Glucose-Capillary 175 (H) 70 - 99 mg/dL  Basic metabolic panel     Status: Abnormal   Collection Time: 01/19/18  4:08 AM  Result Value Ref Range   Sodium 140 135 - 145 mmol/L   Potassium  3.6 3.5 - 5.1 mmol/L   Chloride 108 98 - 111 mmol/L   CO2 24 22 - 32 mmol/L   Glucose, Bld 159 (H) 70 - 99 mg/dL   BUN 12 6 - 20 mg/dL   Creatinine, Ser 0.82 0.61 - 1.24 mg/dL   Calcium 8.5 (L) 8.9 - 10.3 mg/dL   GFR calc non Af Amer >60 >60 mL/min   GFR calc Af Amer >60 >60 mL/min   Anion gap 8 5 - 15    Comment: Performed at [facility], [place] [address] [place], [place] [address]  CBC     Status: Abnormal   Collection Time: 01/19/18  4:08 AM  Result Value Ref Range   WBC 4.2 4.0 - 10.5 K/uL   RBC 3.48 (L) 4.22 - 5.81 MIL/uL   Hemoglobin 11.2 (L) 13.0 - 17.0 g/dL   HCT 34.3 (L) 39.0 - 52.0 %   MCV 98.6 80.0 - 100.0 fL   MCH 32.2 26.0 - 34.0 pg   MCHC 32.7 30.0 - 36.0 g/dL   RDW 12.3 11.5 - 15.5 %   Platelets 144 (L) 150 - 400 K/uL   nRBC 0.0 0.0 - 0.2 %    Comment: Performed at [facility], [place] [address] [place], [place] [address]  Magnesium     Status: None   Collection Time: 01/19/18  4:08 AM  Result Value Ref Range   Magnesium 2.2 1.7 - 2.4 mg/dL    Comment: Performed at [facility], [place] [address], [place], [place] [address]  Phosphorus     Status: None   Collection Time: 01/19/18  4:08 AM  Result Value Ref Range   Phosphorus 2.7 2.5 - 4.6 mg/dL    Comment: Performed at [facility], [place] [address] [place], [place] [address]  Glucose, capillary     Status: Abnormal   Collection Time: 01/19/18  7:35 AM  Result Value Ref Range   Glucose-Capillary 145 (H) 70 - 99 mg/dL    Imaging / Studies: No results found.  Medications / Allergies: per chart  Antibiotics: Anti-infectives (From admission, onward)    Start     Dose/Rate Route Frequency Ordered Stop   01/17/18 1400  neomycin (MYCIFRADIN) tablet 1,000 mg  Status:  Discontinued     1,000 mg Oral 3 times per day 01/17/18 0537 01/17/18 1528   01/17/18 1400  metroNIDAZOLE (FLAGYL) tablet 1,000 mg  Status:  Discontinued     1,000 mg Oral 3 times per day 01/17/18 0537 01/17/18 1528   01/17/18 0600  cefoTEtan (CEFOTAN) 2 g in sodium chloride 0.9 % 100 mL IVPB     2 g 200 mL/hr over 30 Minutes Intravenous On call to O.R. 01/17/18 6270 01/17/18 0744        Note: Portions of this report may have been transcribed using voice recognition software. Every effort was made to ensure accuracy; however, inadvertent computerized transcription errors may be present.   Any transcriptional errors that result from this process are unintentional.     Adin Hector, MD, FACS, MASCRS Gastrointestinal and Minimally Invasive Surgery    [address] [place], [place] [address] [phone] Main / Paging [phone] Fax

## 2018-01-19 NOTE — Progress Notes (Signed)
Nutrition Brief Note  Patient identified on the Malnutrition Screening Tool (MST) Report  Patient with weight loss but has been attempting to lose the weight. Pt's goal is to lose an additional 20 lb. Pt consuming 75% of meals.  Wt Readings from Last 15 Encounters:  01/18/18 94.8 kg  01/13/18 90.7 kg  12/30/17 91.8 kg  12/30/17 91.9 kg  12/03/17 90.4 kg  11/11/17 92.2 kg  10/28/17 94.3 kg  10/10/17 93.6 kg  10/02/17 93.3 kg  09/26/17 95.6 kg  09/12/17 95.6 kg  08/29/17 98.9 kg  08/15/17 100 kg  08/01/17 100.2 kg  07/17/17 100.5 kg    Body mass index is 31.78 kg/m. Patient meets criteria for obesity based on current BMI.   Current diet order is heart healthy, patient is consuming approximately 75% of meals at this time. Labs and medications reviewed.   No nutrition interventions warranted at this time. If nutrition issues arise, please consult RD.   Clayton Bibles, MS, RD, [place] Dietitian Pager: [phone] After Hours Pager: [phone]

## 2018-01-20 LAB — CBC
HCT: 37.9 % — ABNORMAL LOW (ref 39.0–52.0)
Hemoglobin: 12.4 g/dL — ABNORMAL LOW (ref 13.0–17.0)
MCH: 31.3 pg (ref 26.0–34.0)
MCHC: 32.7 g/dL (ref 30.0–36.0)
MCV: 95.7 fL (ref 80.0–100.0)
Platelets: 167 10*3/uL (ref 150–400)
RBC: 3.96 MIL/uL — ABNORMAL LOW (ref 4.22–5.81)
RDW: 11.9 % (ref 11.5–15.5)
WBC: 4.6 10*3/uL (ref 4.0–10.5)
nRBC: 0 % (ref 0.0–0.2)

## 2018-01-20 LAB — BASIC METABOLIC PANEL
Anion gap: 10 (ref 5–15)
BUN: 13 mg/dL (ref 6–20)
CO2: 25 mmol/L (ref 22–32)
Calcium: 9.2 mg/dL (ref 8.9–10.3)
Chloride: 107 mmol/L (ref 98–111)
Creatinine, Ser: 0.89 mg/dL (ref 0.61–1.24)
GFR calc Af Amer: >60 mL/min (ref >60)
GFR calc non Af Amer: >60 mL/min (ref >60)
Glucose, Bld: 152 mg/dL — ABNORMAL HIGH (ref 70–99)
Potassium: 4 mmol/L (ref 3.5–5.1)
Sodium: 142 mmol/L (ref 135–145)

## 2018-01-20 LAB — PHOSPHORUS: Phosphorus: 3.9 mg/dL (ref 2.5–4.6)

## 2018-01-20 LAB — GLUCOSE, CAPILLARY
GLUCOSE-CAPILLARY: 176 mg/dL — AB (ref 70–99)
GLUCOSE-CAPILLARY: 237 mg/dL — AB (ref 70–99)
Glucose-Capillary: 147 mg/dL — ABNORMAL HIGH (ref 70–99)
Glucose-Capillary: 204 mg/dL — ABNORMAL HIGH (ref 70–99)

## 2018-01-20 LAB — MAGNESIUM: Magnesium: 2.4 mg/dL (ref 1.7–2.4)

## 2018-01-20 MED ORDER — IBUPROFEN 200 MG PO TABS
600.0000 mg | ORAL_TABLET | Freq: Four times a day (QID) | ORAL | Status: DC | PRN
  Administered 2018-01-20: 600 mg via ORAL
  Filled 2018-01-20: qty 3

## 2018-01-20 NOTE — Progress Notes (Signed)
Pathology returned today:  1. Colon, segmental resection for tumor, rectum and distal sigmoid - POORLY DIFFERENTIATED ADENOCARCINOMA, SPANNING 1 CM. - TUMOR IS LIMITED TO SUBMUCOSA. - RESECTION MARGINS ARE NEGATIVE. - METASTATIC CARCINOMA IN ONE OF EIGHTEEN LYMPH NODES (1/18). - ISOLATED TUMOR CELLS IN ONE LYMPH NODE. - SEE ONCOLOGY TABLE. 2. Colon, resection margin (donut), distal anastomotic ring - BENIGN COLONIC MUCOSA. - NO DYSPLASIA OR MALIGNANCY.  I discussed the pathology with the patient and his wife today. We discussed importance of surveillance moving forward as per NCCN. His case has been listed for presentation at our multidisciplinary tumor board.  Sharon Mt. Dema Severin, M.D. General and Colorectal Surgery [facility]

## 2018-01-20 NOTE — Progress Notes (Addendum)
Subjective Feeling well - some incisional soreness - denies soreness elsewhere. He denies nausea/vomiting; appetite down some but tolerating solids. Voiding spontaneously and having BMs and flatus  Objective: Vital signs in last 24 hours: Temp:  [97.8 F (36.6 C)-98.6 F (37 C)] 98.6 F (37 C) (01/13 0552) Pulse Rate:  [82-92] 92 (01/13 0552) Resp:  [15-18] 15 (01/13 0552) BP: (129-148)/(68-87) 129/68 (01/13 0552) SpO2:  [98 %-100 %] 98 % (01/13 0552) Weight:  [96.2 kg] 96.2 kg (01/13 0552) Last BM Date: 01/19/18  Intake/Output from previous day: 01/12 0701 - 01/13 0700 In: 323 [P.O.:240; I.V.:83] Out: 1600 [Urine:1600] Intake/Output this shift: No intake/output data recorded.  Gen: NAD, comfortable CV: RRR Pulm: Normal work of breathing Abd: Soft, minimal incisional tenderness; incisions c/d/i without erythema. Not significantly distended. No rebound. No guarding Ext: SCDs in place  Lab Results: CBC  Recent Labs    01/19/18 0408 01/20/18 0442  WBC 4.2 4.6  HGB 11.2* 12.4*  HCT 34.3* 37.9*  PLT 144* 167   BMET Recent Labs    01/19/18 0408 01/20/18 0442  NA 140 142  K 3.6 4.0  CL 108 107  CO2 24 25  GLUCOSE 159* 152*  BUN 12 13  CREATININE 0.82 0.89  CALCIUM 8.5* 9.2   PT/INR No results for input(s): LABPROT, INR in the last 72 hours. ABG No results for input(s): PHART, HCO3 in the last 72 hours.  Invalid input(s): PCO2, PO2  Studies/Results:  Anti-infectives: Anti-infectives (From admission, onward)   Start     Dose/Rate Route Frequency Ordered Stop   01/17/18 1400  neomycin (MYCIFRADIN) tablet 1,000 mg  Status:  Discontinued     1,000 mg Oral 3 times per day 01/17/18 0537 01/17/18 1528   01/17/18 1400  metroNIDAZOLE (FLAGYL) tablet 1,000 mg  Status:  Discontinued     1,000 mg Oral 3 times per day 01/17/18 0537 01/17/18 1528   01/17/18 0600  cefoTEtan (CEFOTAN) 2 g in sodium chloride 0.9 % 100 mL IVPB     2 g 200 mL/hr over 30 Minutes  Intravenous On call to O.R. 01/17/18 0537 01/17/18 0744       Assessment/Plan: Patient Active Problem List   Diagnosis Date Noted  . Insulin-requiring or dependent type II diabetes mellitus ([place])   . Hyperlipidemia   . Neuropathy   . Genetic testing 08/27/2017  . Family history of colon cancer   . Family history of uterine cancer   . Family history of stomach cancer   . Family history of prostate cancer   . Port-A-Cath in place 06/20/2017  . Rectal cancer s/p robotic LAR rectosigmoid resection 01/17/2018 06/12/2017  . Other and unspecified hyperlipidemia 06/19/2013   s/p Procedure(s): XI ROBOT ASSISTED LAPAROSCOPIC  low anterior resection possible diverting LOOP ILEOSTOMY FLEXIBLE SIGMOIDOSCOPY CYSTOSCOPY WITH STENT PLACEMENT 01/17/2018  -Ambulate 5x/day -Carb controlled diet -Add ibuprofen for musculoskeletal pain  -Continue diet as tolerated -PPx: SQH, SCDs -Dispo: Possible d/c tomorrow   LOS: 3 days   Sharon Mt. Dema Severin, M.D. General and Colorectal Surgery [facility]

## 2018-01-20 NOTE — Evaluation (Signed)
Physical Therapy Evaluation Patient Details Name: Charles [name] MRN: [id number] DOB: 10/09/1965 Today's Date: 01/20/2018   History of Present Illness  s/p robot assisted laparoscopic lower anterior resection, cystoscopy with stent placement.  H/o rectal CA, s/p XRT and chemo  Clinical Impression  Patient evaluated by Physical Therapy with no further acute PT needs identified. All education has been completed and the patient has no further questions.  Pt doing quite well today, reviewed gait, stairs, and graded squats as well as step ups for quad/glut/hamstring strengthening at home; no further needs, pt in agreement and is planning to d/c tomorrow;  Encouraged pt to continue to amb on the unit See below for any follow-up Physical Therapy or equipment needs. PT is signing off. Thank you for this referral.     Follow Up Recommendations No PT follow up    Equipment Recommendations  3in1 (PT)    Recommendations for Other Services       Precautions / Restrictions Precautions Precautions: Fall Precaution Comments: peripheral neuropathy      Mobility  Bed Mobility Overal bed mobility: Modified Independent                Transfers     Transfers: Sit to/from Stand           General transfer comment: incr time to stand and steady self  Ambulation/Gait Ambulation/Gait assistance: Supervision;Modified independent (Device/Increase time) Gait Distance (Feet): 700 Feet Assistive device: None Gait Pattern/deviations: Wide base of support;Step-through pattern     General Gait Details: no LOB, mildly unsteady initially however improved with distance  Stairs Stairs: Yes Stairs assistance: Min guard;Supervision Stair Management: One rail Right;One rail Left;Forwards;Step to pattern Number of Stairs: 10 General stair comments: cues for sequence, pt able to return demo correct and safe technique  Wheelchair Mobility    Modified Rankin (Stroke Patients Only)        Balance     Sitting balance-Leahy Scale: Normal       Standing balance-Leahy Scale: Good               High level balance activites: Backward walking;Direction changes;Turns;Head turns High Level Balance Comments: no LOB             Pertinent Vitals/Pain Pain Assessment: Faces Faces Pain Scale: Hurts a little bit Pain Location: incision Pain Descriptors / Indicators: Pressure Pain Intervention(s): Limited activity within patient's tolerance;Monitored during session;Repositioned    Home Living Family/patient expects to be discharged to:: Private residence Living Arrangements: Spouse/significant other Available Help at Discharge: Family;Available PRN/intermittently Type of Home: House       Home Layout: Multi-level;Bed/bath upstairs Home Equipment: None      Prior Function Level of Independence: Independent               Hand Dominance        Extremity/Trunk Assessment   Upper Extremity Assessment Upper Extremity Assessment: Defer to OT evaluation;Overall WFL for tasks assessed    Lower Extremity Assessment Lower Extremity Assessment: Overall WFL for tasks assessed;RLE deficits/detail;LLE deficits/detail RLE Sensation: history of peripheral neuropathy LLE Deficits / Details: WFL however pt reported occasional knee buckling LLE Sensation: history of peripheral neuropathy       Communication   Communication: No difficulties  Cognition Arousal/Alertness: Awake/alert Behavior During Therapy: WFL for tasks assessed/performed Overall Cognitive Status: Within Functional Limits for tasks assessed  General Comments      Exercises     Assessment/Plan    PT Assessment Patent does not need any further PT services  PT Problem List         PT Treatment Interventions      PT Goals (Current goals can be found in the Care Plan section)  Acute Rehab PT Goals Patient Stated Goal: return  to independence PT Goal Formulation: All assessment and education complete, DC therapy    Frequency     Barriers to discharge        Co-evaluation               AM-PAC PT "6 Clicks" Mobility  Outcome Measure Help needed turning from your back to your side while in a flat bed without using bedrails?: None Help needed moving from lying on your back to sitting on the side of a flat bed without using bedrails?: None Help needed moving to and from a bed to a chair (including a wheelchair)?: None Help needed standing up from a chair using your arms (e.g., wheelchair or bedside chair)?: None Help needed to walk in hospital room?: None Help needed climbing 3-5 steps with a railing? : A Little 6 Click Score: 23    End of Session Equipment Utilized During Treatment: Gait belt Activity Tolerance: Patient tolerated treatment well Patient left: Other (comment)(standing in room. ) Nurse Communication: Mobility status PT Visit Diagnosis: Unsteadiness on feet (R26.81)    Time: 1212-1226 PT Time Calculation (min) (ACUTE ONLY): 14 min   Charges:   PT Evaluation $PT Eval Low Complexity: 1 Low          Kenyon Ana, PT  Pager: [phone] Acute Rehab Dept [facility]): [phone]   01/20/2018   [facility] 01/20/2018, 12:57 PM

## 2018-01-21 LAB — GLUCOSE, CAPILLARY: Glucose-Capillary: 167 mg/dL — ABNORMAL HIGH (ref 70–99)

## 2018-01-21 MED ORDER — TRAMADOL HCL 50 MG PO TABS: 50.0000 mg | ORAL_TABLET | Freq: Four times a day (QID) | ORAL | 0 refills | Status: AC | PRN

## 2018-01-21 NOTE — Discharge Summary (Signed)
Patient ID: Krishiv Sandler MRN: [id number] DOB/AGE: 53-Sep-1967 53 y.o.  Admit date: 01/17/2018 Discharge date: 01/21/2018  Discharge Diagnoses Patient Active Problem List   Diagnosis Date Noted  . Insulin-requiring or dependent type II diabetes mellitus ([place])   . Hyperlipidemia   . Neuropathy   . Genetic testing 08/27/2017  . Family history of colon cancer   . Family history of uterine cancer   . Family history of stomach cancer   . Family history of prostate cancer   . Port-A-Cath in place 06/20/2017  . Rectal cancer s/p robotic LAR rectosigmoid resection 01/17/2018 06/12/2017  . Other and unspecified hyperlipidemia 06/19/2013    Consultants None  Procedures XI ROBOT ASSISTED LAPAROSCOPIC  low anterior resection (rectal cancer) FLEXIBLE SIGMOIDOSCOPY CYSTOSCOPY WITH STENT PLACEMENT 01/17/2018  Hospital Course: He was admitted to the hospital postoperatively where he recovered well. His diet was advanced. On POD#0 he was having bowel function which continued through his hospital stay. On POD#4, he was tolerating a regular diet, ambulating, pain was well controlled on oral analgesics, he continued to have bowel function. He cleared PT. He was deemed stable for discharge home.  His pathology returned 1. Colon, segmental resection for tumor, rectum and distal sigmoid - POORLY DIFFERENTIATED ADENOCARCINOMA, SPANNING 1 CM. - TUMOR IS LIMITED TO SUBMUCOSA. - RESECTION MARGINS ARE NEGATIVE. - METASTATIC CARCINOMA IN ONE OF EIGHTEEN LYMPH NODES (1/18). - ISOLATED TUMOR CELLS IN ONE LYMPH NODE. - SEE ONCOLOGY TABLE. 2. Colon, resection margin (donut), distal anastomotic ring - BENIGN COLONIC MUCOSA. - NO DYSPLASIA OR MALIGNANCY.  This was reviewed with him prior to discharge.   Allergies as of 01/21/2018      Reactions   Cefradine [cephradine]    Gi upset      Medication List    STOP taking these medications   fluconazole 100 MG tablet Commonly known as:  DIFLUCAN      TAKE these medications   atorvastatin 80 MG tablet Commonly known as:  LIPITOR Take 80 mg by mouth daily at 6 PM.   B-D UF III MINI PEN NEEDLES 31G X 5 MM Misc Generic drug:  Insulin Pen Needle USE AS DIRECTED EVERY DAY   cetirizine 10 MG tablet Commonly known as:  ZYRTEC Take 10 mg by mouth daily as needed for allergies.   DULoxetine 30 MG capsule Commonly known as:  CYMBALTA Take 1 capsule (30 mg total) by mouth daily. Do not take Tramadol while on duloxetine   fluticasone 50 MCG/ACT nasal spray Commonly known as:  FLONASE Place 1 spray into both nostrils daily as needed for allergies or rhinitis.   FREESTYLE LIBRE 14 DAY SENSOR Misc APPLY 1 SENSOR TO THE BACK OF UPPER ARM EVERY 14 DAYS   ibuprofen 200 MG tablet Commonly known as:  ADVIL,MOTRIN Take 400 mg by mouth 2 (two) times daily as needed for moderate pain or cramping.   JARDIANCE 25 MG Tabs tablet Generic drug:  empagliflozin Take 25 mg by mouth daily.   latanoprost 0.005 % ophthalmic solution Commonly known as:  XALATAN Place 1 drop into both eyes at bedtime.   lidocaine-prilocaine cream Commonly known as:  EMLA Apply 1 application topically as needed. What changed:  reasons to take this   liraglutide 18 MG/3ML Sopn Commonly known as:  VICTOZA Inject 1.8 mg into the skin daily.   NOVOLOG MIX 70/30 FLEXPEN (70-30) 100 UNIT/ML FlexPen Generic drug:  insulin aspart protamine - aspart Inject 40-120 Units into the skin See admin instructions. Inject 120  units in the morning and 40 units in the evening   potassium chloride SA 20 MEQ tablet Commonly known as:  K-DUR,KLOR-CON Take 1 tablet (20 mEq total) by mouth daily. What changed:    when to take this  reasons to take this   prochlorperazine 10 MG tablet Commonly known as:  COMPAZINE Take 1 tablet (10 mg total) by mouth every 6 (six) hours as needed for nausea or vomiting.   traMADol 50 MG tablet Commonly known as:  ULTRAM Take 1 tablet (50 mg  total) by mouth every 6 (six) hours as needed for up to 7 days (acute postop pain not controlled with tylenol and ibuprofen).          Sharon Mt. Dema Severin, M.D. [facility]

## 2018-01-21 NOTE — Progress Notes (Addendum)
Subjective Feeling well - some incisional soreness, improving. He denies nausea/vomiting; appetite picked up. Continues to pass gas; 3 BMs yesterday. Ambulating on his own. Cleared PT.  Objective: Vital signs in last 24 hours: Temp:  [97.4 F (36.3 C)-98.2 F (36.8 C)] 98.2 F (36.8 C) (01/14 0614) Pulse Rate:  [84-87] 84 (01/14 0614) Resp:  [18-20] 18 (01/14 0614) BP: (129-150)/(70-74) 142/74 (01/14 0614) SpO2:  [92 %-99 %] 98 % (01/14 0614) Last BM Date: 01/20/18  Intake/Output from previous day: 01/13 0701 - 01/14 0700 In: 1440 [P.O.:1440] Out: 1000 [Urine:1000] Intake/Output this shift: No intake/output data recorded.  Gen: NAD, comfortable CV: RRR Pulm: Normal work of breathing Abd: Soft, minimal incisional tenderness; incisions c/d/i without erythema. Not significantly distended. No rebound. No guarding Ext: SCDs in place  Lab Results: CBC  Recent Labs    01/19/18 0408 01/20/18 0442  WBC 4.2 4.6  HGB 11.2* 12.4*  HCT 34.3* 37.9*  PLT 144* 167   BMET Recent Labs    01/19/18 0408 01/20/18 0442  NA 140 142  K 3.6 4.0  CL 108 107  CO2 24 25  GLUCOSE 159* 152*  BUN 12 13  CREATININE 0.82 0.89  CALCIUM 8.5* 9.2   PT/INR No results for input(s): LABPROT, INR in the last 72 hours. ABG No results for input(s): PHART, HCO3 in the last 72 hours.  Invalid input(s): PCO2, PO2  Studies/Results:  Anti-infectives: Anti-infectives (From admission, onward)   Start     Dose/Rate Route Frequency Ordered Stop   01/17/18 1400  neomycin (MYCIFRADIN) tablet 1,000 mg  Status:  Discontinued     1,000 mg Oral 3 times per day 01/17/18 0537 01/17/18 1528   01/17/18 1400  metroNIDAZOLE (FLAGYL) tablet 1,000 mg  Status:  Discontinued     1,000 mg Oral 3 times per day 01/17/18 0537 01/17/18 1528   01/17/18 0600  cefoTEtan (CEFOTAN) 2 g in sodium chloride 0.9 % 100 mL IVPB     2 g 200 mL/hr over 30 Minutes Intravenous On call to O.R. 01/17/18 0537 01/17/18 0744        Assessment/Plan: Patient Active Problem List   Diagnosis Date Noted  . Insulin-requiring or dependent type II diabetes mellitus ([place])   . Hyperlipidemia   . Neuropathy   . Genetic testing 08/27/2017  . Family history of colon cancer   . Family history of uterine cancer   . Family history of stomach cancer   . Family history of prostate cancer   . Port-A-Cath in place 06/20/2017  . Rectal cancer s/p robotic LAR rectosigmoid resection 01/17/2018 06/12/2017  . Other and unspecified hyperlipidemia 06/19/2013   s/p Procedure(s): XI ROBOT ASSISTED LAPAROSCOPIC  low anterior resection possible diverting LOOP ILEOSTOMY FLEXIBLE SIGMOIDOSCOPY CYSTOSCOPY WITH STENT PLACEMENT 01/17/2018 POD#4  -Ambulate 5x/day -Carb controlled diet -Continue diet as tolerated -PPx: SQH, SCDs -Dispo: Anticipate discharge home today   LOS: 4 days   Sharon Mt. Dema Severin, M.D. General and Colorectal Surgery [facility]

## 2018-01-21 NOTE — Plan of Care (Signed)
  Problem: Education: Goal: Required Educational Video(s) Outcome: Progressing   Problem: Clinical Measurements: Goal: Ability to maintain clinical measurements within normal limits will improve Outcome: Progressing Goal: Postoperative complications will be avoided or minimized Outcome: Progressing   Problem: Skin Integrity: Goal: Demonstration of wound healing without infection will improve Outcome: Progressing   Problem: Education: Goal: Knowledge of General Education information will improve Description Including pain rating scale, medication(s)/side effects and non-pharmacologic comfort measures Outcome: Progressing   Problem: Health Behavior/Discharge Planning: Goal: Ability to manage health-related needs will improve Outcome: Progressing   Problem: Clinical Measurements: Goal: Ability to maintain clinical measurements within normal limits will improve Outcome: Progressing Goal: Will remain free from infection Outcome: Progressing Goal: Diagnostic test results will improve Outcome: Progressing Goal: Respiratory complications will improve Outcome: Progressing Goal: Cardiovascular complication will be avoided Outcome: Progressing   Problem: Activity: Goal: Risk for activity intolerance will decrease Outcome: Progressing   Problem: Nutrition: Goal: Adequate nutrition will be maintained Outcome: Progressing   Problem: Coping: Goal: Level of anxiety will decrease Outcome: Progressing   Problem: Elimination: Goal: Will not experience complications related to bowel motility Outcome: Progressing Goal: Will not experience complications related to urinary retention Outcome: Progressing   Problem: Pain Managment: Goal: General experience of comfort will improve Outcome: Progressing   Problem: Safety: Goal: Ability to remain free from injury will improve Outcome: Progressing

## 2018-01-21 NOTE — Discharge Instructions (Addendum)
POST OP INSTRUCTIONS AFTER COLORECTAL SURGERY  1. DIET: Be sure to include lots of fluids daily to stay hydrated - 64oz of water per day (8, 8 oz glasses).  Avoid fast food or heavy meals for the first couple of weeks as your are more likely to get nauseated. Avoid raw/uncooked fruits or vegetables for the first 2-3 weeks (its ok to have these if they are blended into smoothie form). Alternatively, you have fruits/vegetables, make sure they are cooked until soft enough to mash on the roof of your mouth and chew your food well. Otherwise, diet as tolerated.  2. Take your usually prescribed home medications unless otherwise directed.  3. PAIN CONTROL: a. Pain is best controlled by a usual combination of three different methods TOGETHER: i. Ice/Heat ii. Over the counter pain medication iii. Prescription pain medication b. Most patients will experience some swelling and bruising around the surgical site.  Ice packs or heating pads (30-60 minutes up to 6 times a day) will help. Some people prefer to use ice alone, heat alone, alternating between ice & heat.  Experiment to what works for you.  Swelling and bruising can take several weeks to resolve.   c. It is helpful to take an over-the-counter pain medication regularly for the first few weeks: i. Ibuprofen (Motrin/Advil) - 200mg  tabs - take 3 tabs (600mg ) every 6 hours as needed for pain (unless you have been directed previously to avoid NSAIDs/ibuprofen) ii. Acetaminophen (Tylenol) - you may take 650mg  every 6 hours as needed. You can take this with motrin as they act differently on the body. If you are taking a narcotic pain medication that has acetaminophen in it, do not take over the counter tylenol at the same time. iii. NOTE: You may take both of these medications together - most patients  find it most helpful when alternating between the two (i.e. Ibuprofen at 6am, tylenol at 9am, ibuprofen at 12pm ...) d. A  prescription for pain medication  should be given to you upon discharge.  Take your pain medication as prescribed if your pain is not adequatly controlled with the over-the-counter pain reliefs mentioned above.  4. Avoid getting constipated.  Between the surgery and the pain medications, you may experience some constipation.  Increasing fluid intake and taking a fiber supplement (such as Metamucil, Citrucel, FiberCon, MiraLax, etc) 1-2 times a day regularly will usually help prevent this problem from occurring.  A mild laxative (prune juice, Milk of Magnesia, MiraLax, etc) should be taken according to package directions if there are no bowel movements after 48 hours.    5. Dressing: Your incisions are covered in Dermabond which is like sterile superglue for the skin. This will come off on it's own in a couple weeks. It is waterproof and you may bathe normally starting the day after your surgery in a shower. Avoid baths/pools/lakes/oceans until your wounds have fully healed.  6. ACTIVITIES as tolerated:   a. Avoid heavy lifting (>10lbs or 1 gallon of milk) for the next 6 weeks. b. You may resume regular daily activities as tolerated--such as daily self-care, walking, climbing stairs--gradually increasing activities as tolerated.  If you can walk 30 minutes without difficulty, it is safe to try more intense activity such as jogging, treadmill, bicycling, low-impact aerobics.  c. DO NOT PUSH THROUGH PAIN.  Let pain be your guide: If it hurts to do something, don't do it. d. Dennis Bast may drive when you are no longer taking prescription pain medication, you can comfortably  wear a seatbelt, and you can safely maneuver your car and apply brakes.  7. FOLLOW UP in our office a. Please call [facility] at [phone] to set up an appointment to see your surgeon in the office for a follow-up appointment approximately 2-3 weeks after your surgery. b. Make sure that you call for this appointment the day you arrive home to insure a convenient appointment  time.  9. If you have disability or family leave forms that need to be completed, you may have them completed by your primary care physician's office; for return to work instructions, please ask our office staff and they will be happy to assist you in obtaining this documentation   When to call us [phone]: 1. Poor pain control 2. Reactions / problems with new medications (rash/itching, etc)  3. Fever over 101.5 F (38.5 C) 4. Inability to urinate 5. Nausea/vomiting 6. Worsening swelling or bruising 7. Continued bleeding from incision. 8. Increased pain, redness, or drainage from the incision  The clinic staff is available to answer your questions during regular business hours (8:30am-5pm).  Please dont hesitate to call and ask to speak to one of our nurses for clinical concerns.   A surgeon from [facility] is always on call at the hospitals   If you have a medical emergency, go to the nearest emergency room or call 911.  [facility], [place] [address] [place], [place], [place]  [address] MAIN: [phone] FAX: [phone] [email or web address]

## 2018-01-21 NOTE — Progress Notes (Signed)
Patient ambulated to vehicle with spouse. All belongings sent with patient. Bedside commode provided by case management. Discharge education provided and all questions answered. Vital signs stable and pain controlled.

## 2018-01-25 ENCOUNTER — Other Ambulatory Visit: Payer: Self-pay | Admitting: Oncology

## 2018-01-27 ENCOUNTER — Inpatient Hospital Stay: Payer: 59 | Attending: Oncology | Admitting: Nurse Practitioner

## 2018-01-27 ENCOUNTER — Telehealth: Payer: Self-pay | Admitting: Nurse Practitioner

## 2018-01-27 ENCOUNTER — Encounter: Payer: Self-pay | Admitting: Nurse Practitioner

## 2018-01-27 VITALS — BP 131/68 | HR 98 | Temp 97.7°F | Resp 18 | Ht 68.0 in | Wt 203.1 lb

## 2018-01-27 DIAGNOSIS — C779 Secondary and unspecified malignant neoplasm of lymph node, unspecified: Secondary | ICD-10-CM | POA: Diagnosis not present

## 2018-01-27 DIAGNOSIS — C20 Malignant neoplasm of rectum: Secondary | ICD-10-CM

## 2018-01-27 DIAGNOSIS — E1142 Type 2 diabetes mellitus with diabetic polyneuropathy: Secondary | ICD-10-CM | POA: Diagnosis not present

## 2018-01-27 NOTE — Progress Notes (Addendum)
  [facility] OFFICE PROGRESS NOTE   Diagnosis: Rectal cancer  INTERVAL HISTORY:   Charles [name] returns as scheduled.  He underwent low anterior resection 01/17/2018.  He has abdominal pain related to surgery.  He is taking tramadol, Advil and Tylenol.  Bowels are moving, irregular.  No nausea.  He notes decreased neuropathy in the fingertips, increased in the feet.  He is currently not taking Cymbalta due to a possible interaction with tramadol.  Objective:  Vital signs in last 24 hours:  Blood pressure 131/68, pulse 98, temperature 97.7 F (36.5 C), temperature source Oral, resp. rate 18, height '5\' 8"'$  (1.727 m), weight 203 lb 1.6 oz (92.1 kg), SpO2 100 %.    HEENT: No thrush or ulcers. Resp: Lungs clear bilaterally. Cardio: Regular rate and rhythm. GI: Abdomen is soft.  Healed surgical incisions.  No hepatomegaly. Vascular: No leg edema.  Port-A-Cath without erythema.  Lab Results:  Lab Results  Component Value Date   WBC 4.6 01/20/2018   HGB 12.4 (L) 01/20/2018   HCT 37.9 (L) 01/20/2018   MCV 95.7 01/20/2018   PLT 167 01/20/2018   NEUTROABS 3.9 01/13/2018    Imaging:  No results found.  Medications: I have reviewed the patient's current medications.  Assessment/Plan: 1. Rectal cancer, clinical stage III ? Colonoscopy 05/24/2017, mass at 12 cm from the anal verge, biopsy confirmed invasive poorly differentiated adenocarcinoma ? CTs 06/06/2017-rectal mass, perirectal adenopathy, no evidence of metastatic disease, nonspecific 1 cm sclerotic lesion at the right iliac crest ? MRI 06/06/2017, T3cN1tumor measured at 5.3 cm from the anal sphincter ? Cycle 1 FOLFOX 06/20/2017 ? Cycle 2 FOLFOX 07/04/2017 ? Cycle 3 FOLFOX 07/17/2017 ? Cycle 4 FOLFOX 08/01/2017 (Emend added secondary to prolonged nausea following chemotherapy) ? Cycle 5 FOLFOX 08/15/2017 ? Cycle 6 FOLFOX 08/29/2017 ? Cycle 7 FOLFOX 09/12/2017 ? Cycle 8 FOLFOX 09/26/2017 (dose reduced due to neuropathy  and thrombocytopenia) ? Initiation of radiation and capecitabine 10/16/2017, completed 11/22/2017 ? Restaging CTs 12/10/2017- no evidence of metastatic disease, decrease in the rectal mass and near complete resolution of perirectal lymphadenopathy  ? Status post low anterior resection 01/17/2018-1 cm poorly differentiated adenocarcinoma; tumor limited to submucosa; margins negative; metastatic carcinoma in 1 of 18 lymph nodes; isolated tumor cells in 1 lymph node; treatment effect present; no loss of mismatch repair protein expression  2. Diabetes 3. Glaucoma 4. Cecal polyp, tubular adenoma noted on the colonoscopy 05/24/2017 5. Port-A-Cath placement,Dr. White,06/17/2017 6. Oxaliplatinneuropathy  Disposition: Charles [name] appears stable.  He is recovering from the recent surgery.  Dr. Benay Spice reviewed the pathology report with him and his wife at today's visit.  No further treatment is recommended.  We are referring him to Dr. Dema Severin for removal of the Port-A-Cath.  He will return for a CEA and follow-up visit in 2 months.  He will contact the office in the interim with any problems.  Patient seen with Dr. Benay Spice.    [name] ANP/GNP-BC   01/27/2018  3:07 PM  This was a shared visit with [name].  Charles [name] is recovering from the low anterior resection.  Peripheral neuropathy symptoms appear partially improved.  He will resume Cymbalta when he is no longer taking tramadol. We reviewed the surgical pathology report with him.  He has been diagnosed with pathologic stage III rectal cancer.  He had significant downstaging with the neoadjuvant therapy.  He will be followed with observation.  Julieanne Manson, MD

## 2018-01-27 NOTE — Progress Notes (Signed)
FMLA disability paper work that pt needs to be filled out and faxed left on [employer] desk (01/27/18)

## 2018-01-27 NOTE — Telephone Encounter (Signed)
Scheduled appt per 1/20 los - sent reminder letter in the mail with appt date and time  F/u and lab in 3 months.

## 2018-01-28 ENCOUNTER — Encounter: Payer: Self-pay | Admitting: *Deleted

## 2018-02-11 ENCOUNTER — Ambulatory Visit: Payer: Self-pay | Admitting: Surgery

## 2018-02-11 NOTE — H&P (Signed)
CC: Newly diagnosed rectal cancer, referred by Dr. Schooler - here for f/u now s/p FOLFOX and then neoadjuvant chemoXRT  HPI: Charles [name] is a very pleasant 53-year-old gentleman with history of hypertension, hyperlipidemia, diabetes here for evaluation of a newly diagnosed rectal cancer. He underwent a colonoscopy with Dr. Schooler 05/24/17 which demonstrated a likely malignant tumor in the rectosigmoid colon at 12 cm proximal to the anus. Biopsied and tattooed. He also had 15 and only a polyp in the cecum removed with a hot snare. Internal hemorrhoids. We do not have a copy of the pathology report this time and has sent a request to Dr. Schooler for this. This colonoscopy was done for the purposes of rectal bleeding when she had the last month. He had had no prior colonoscopy. He denies any issues with abdominal pain unless he saw a spicy foods. He denies any nausea or vomiting unless he's on spicy foods. He is having 3-4 bowel movements per day. They're intermittently blood tinged.  He tolerated systemic therapy reasonably well.  CT C/A/P 06/06/17 demonstrated an eccentric hyperenhancing upper left rectal 2.9 cm mass. 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 No definite additional sites of metastatic disease. Solitary sclerotic lesion in the superior right iliac crest nonspecific.  MRI Pelvis with/without contrast 06/06/17 Demonstrated rectal adenocarcinoma T3c N1, 5.3 cm from the anal sphincter by MRI - interpreted as mid rectum  CEA 0.7  Completed chemoradiation - 5.5 weeks - finished 11/22/17. Since completing neoadjuvant FOLFOX, he has developed numbness in his hands and feet. He also reports some proprioceptive issues in his feet. He denies any blood in his stool states this has resolved. He denies any issues with incontinence to gas, liquid stool, or solid stool  INTERVAL HX He was taken to the  operating room on 01/17/2018 and underwent robotic-assisted low anterior resection with flexible sigmoidoscopy. He recovered well from this and was discharged home on POD#4. He is here today for follow-up. His pathology returned poor differentiated adenocarcinoma screening 1 cm, limited to submucosa, resection margins are negative, metastatic carcinoma in 1 of 18 lymph nodes area isolated tumor cells in 1 lymph node (2LN in total). Distal anastomotic ring was sent separately and came back benign colonic mucosa without dysplasia or malignancy. Mismatch repair proteins returned normal. ypT1N1aM0 He had undergone total neoadjuvant therapy. He was seen in follow-up with oncology after surgery who is planning ongoing surveillance.  Today, he reports some crampy achy right lower quadrant discomfort that has been present since he had his surgery. This appears to be musculoskeletal as it is made worse with certain torquing activities of his torso as well as sitting up and laying down. He denies pain anywhere else in his abdomen. He denies any fever/chills/nausea/vomiting. He is tolerating regular diet including cheeseburgers. He reports having 4-5 small volume bowel movements per day. He does report that his right-sided cramps seem to worsen when he bears down to have a bowel movement.   PMH: HTN, HLD, DM (on Victoza and insulin)  PSH: Open appendectomy via RLQ incision; right achilles; open inguinal hernia repair as infant x2  FHx: Aunt had lymphoma; Mother had cervical cancer; paternal family history unknown  Social: Denies use of tobacco/drugs; social EtOH  ROS: A comprehensive 10 system review of systems was completed with the patient and pertinent findings as noted above.  The patient is a 53 year old male.   Allergies (Patricia King, RMA; 02/03/2018 3:57 PM)   No Known Drug Allergies [05/31/2017]: Allergies Reconciled   Medication History (Patricia King, RMA; 02/03/2018 3:57  PM) Latanoprost (0.005% Solution, Ophthalmic) Active. Lidocaine-Prilocaine (2.5-2.5% Cream, External) Active. Xeloda (500MG Tablet, Oral) Active. Klor-Con M20 (20MEQ Tablet ER, Oral) Active. Fluconazole (100MG Tablet, Oral) Active. Clotrimazole (10MG Troche, Mouth/Throat) Active. Advil (200MG Tablet, Oral) Active. NovoLOG Mix 70/30 FlexPen ((70-30) 100UNIT/ML Susp Pen-inj, Subcutaneous) Active. Prochlorperazine Maleate (10MG Tablet, Oral) Active. traMADol HCl (50MG Tablet, Oral) Active. Victoza (18MG/3ML Solution, Subcutaneous) Active. FreeStyle Libre 14 Day Sensor Active. Jardiance (10MG Tablet, Oral) Active. Victoza (18MG/3ML Soln Pen-inj, Subcutaneous) Active. Atorvastatin Calcium (80MG Tablet, Oral) Active. ZyrTEC Allergy (10MG Capsule, Oral) Active. Jardiance (25MG Tablet, Oral) Active. BD Pen Needle Mini U/F (31G X 5 MM Misc,) Active. Medications Reconciled  Vitals (Patricia King RMA; 02/03/2018 3:56 PM) 02/03/2018 3:56 PM Weight: 200.8 lb Height: 68in Body Surface Area: 2.05 m Body Mass Index: 30.53 kg/m  Temp.: 98.4F  Pulse: 99 (Regular)  BP: 125/80 (Sitting, Left Arm, Standard)       Physical Exam (Fern Canova M. Annastasia Haskins MD; 02/03/2018 4:53 PM) The physical exam findings are as follows: Note:Gen: NAD, comfortable CV: RRR GI: Abdomen is soft, not significantly tender, nondistended. All incisions are clean/dry/intact without erythema. There is no drainage. There is some point tenderness just lateral to his Pfannenstiel incision likely at the edge of the fascial closure. No palpable hernias    Assessment & Plan (Amador Braddy M. Kale Dols MD; 02/03/2018 5:02 PM) RECTAL CANCER (C20) Story: [name] is a very pleasant 53yoM with HTN, HLD, DM here today for f/u - cT3cN1M0 invasive adenoCA of the rectum - 12cm from verge on scope, 5.3cm from verge on MRI; not palpable on physical exam; CEA 0.7 He has completed systemic chemotherapy and now  chemoXRT - last day was 11/22/17 Now s/p robotic-assisted low anterior resection 01/17/2018 Pathology returned ypT1N1aM0, MMR preserved Impression: -I recommended he began taking a fiber supplement such as Metamucil or Benefiber to help bind his stool and also potentially treat any potential underlying constipation which may be contributing. His discomfort appears to be musculoskeletal given that it is brought on by torquing of his torso and activity and alleviated by rest. I recommend he continue taking Tylenol and as needed ibuprofen. Should he need it tramadol for worsening pains. We discussed warning signs including fever/chills/nausea/vomiting or abdominal distention. -Both myself and Dr. Sherrill have discussed the pathology with him and NCCN recommendations for surveillance of colorectal cancer moving forward -I answered all his questions today -We will plan to see him back in 3-4 weeks for follow-up or sooner if necessary  Signed electronically by Kambry Takacs M Koralynn Greenspan, MD (02/03/2018 5:03 PM) 

## 2018-02-11 NOTE — H&P (View-Only) (Signed)
CC: Newly diagnosed rectal cancer, referred by Charles [name] - here for f/u now s/p FOLFOX and then neoadjuvant chemoXRT  HPI: Charles [name] is a very pleasant 53 year old gentleman with history of hypertension, hyperlipidemia, diabetes here for evaluation of a newly diagnosed rectal cancer. He underwent a colonoscopy with Charles [name] 05/24/17 which demonstrated a likely malignant tumor in the rectosigmoid colon at 12 cm proximal to the anus. Biopsied and tattooed. He also had 15 and only a polyp in the cecum removed with a hot snare. Internal hemorrhoids. We do not have a copy of the pathology report this time and has sent a request to Charles [name] for this. This colonoscopy was done for the purposes of rectal bleeding when she had the last month. He had had no prior colonoscopy. He denies any issues with abdominal pain unless he saw a spicy foods. He denies any nausea or vomiting unless he's on spicy foods. He is having 3-4 bowel movements per day. They're intermittently blood tinged.  He tolerated systemic therapy reasonably well.  CT C/A/P 06/06/17 demonstrated an eccentric hyperenhancing upper left rectal 2.9 cm mass. 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 No definite additional sites of metastatic disease. Solitary sclerotic lesion in the superior right iliac crest nonspecific.  MRI Pelvis with/without contrast 06/06/17 Demonstrated rectal adenocarcinoma T3c N1, 5.3 cm from the anal sphincter by MRI - interpreted as mid rectum  CEA 0.7  Completed chemoradiation - 5.5 weeks - finished 11/22/17. Since completing neoadjuvant FOLFOX, he has developed numbness in his hands and feet. He also reports some proprioceptive issues in his feet. He denies any blood in his stool states this has resolved. He denies any issues with incontinence to gas, liquid stool, or solid stool  INTERVAL HX He was taken to the  operating room on 01/17/2018 and underwent robotic-assisted low anterior resection with flexible sigmoidoscopy. He recovered well from this and was discharged home on POD#4. He is here today for follow-up. His pathology returned poor differentiated adenocarcinoma screening 1 cm, limited to submucosa, resection margins are negative, metastatic carcinoma in 1 of 18 lymph nodes area isolated tumor cells in 1 lymph node (2LN in total). Distal anastomotic ring was sent separately and came back benign colonic mucosa without dysplasia or malignancy. Mismatch repair proteins returned normal. ypT1N1aM0 He had undergone total neoadjuvant therapy. He was seen in follow-up with oncology after surgery who is planning ongoing surveillance.  Today, he reports some crampy achy right lower quadrant discomfort that has been present since he had his surgery. This appears to be musculoskeletal as it is made worse with certain torquing activities of his torso as well as sitting up and laying down. He denies pain anywhere else in his abdomen. He denies any fever/chills/nausea/vomiting. He is tolerating regular diet including cheeseburgers. He reports having 4-5 small volume bowel movements per day. He does report that his right-sided cramps seem to worsen when he bears down to have a bowel movement.   PMH: HTN, HLD, DM (on Victoza and insulin)  PSH: Open appendectomy via RLQ incision; right achilles; open inguinal hernia repair as infant x2  FHx: Aunt had lymphoma; Mother had cervical cancer; paternal family history unknown  Social: Denies use of tobacco/drugs; social EtOH  ROS: A comprehensive 10 system review of systems was completed with the patient and pertinent findings as noted above.  The patient is a 53 year old male.   Allergies Charles [name], Charles [name]; 02/03/2018 3:57 PM)  No Known Drug Allergies [05/31/2017]: Allergies Reconciled   Medication History Charles [name], Charles [name]; 02/03/2018 3:57  PM) Latanoprost (0.005% Solution, Ophthalmic) Active. Lidocaine-Prilocaine (2.5-2.5% Cream, External) Active. Xeloda (500MG Tablet, Oral) Active. Klor-Con M20 [facility] Tablet ER, Oral) Active. Fluconazole (100MG Tablet, Oral) Active. Clotrimazole (10MG Troche, Mouth/Throat) Active. Advil (200MG Tablet, Oral) Active. NovoLOG Mix 70/30 FlexPen ((70-30) 100UNIT/ML Susp Pen-inj, Subcutaneous) Active. Prochlorperazine Maleate (10MG Tablet, Oral) Active. traMADol HCl (50MG Tablet, Oral) Active. Victoza (18MG/3ML Solution, Subcutaneous) Active. FreeStyle Libre 14 Day Sensor Active. Jardiance (10MG Tablet, Oral) Active. Victoza (18MG/3ML Soln Pen-inj, Subcutaneous) Active. Atorvastatin Calcium (80MG Tablet, Oral) Active. ZyrTEC Allergy (10MG Capsule, Oral) Active. Jardiance (25MG Tablet, Oral) Active. BD Pen Needle Mini U/F (31G X 5 MM Misc,) Active. Medications Reconciled  Vitals Charles [name] RMA; 02/03/2018 3:56 PM) 02/03/2018 3:56 PM Weight: 200.8 lb Height: 68in Body Surface Area: 2.05 m Body Mass Index: 30.53 kg/m  Temp.: 98.41F  Pulse: 99 (Regular)  BP: 125/80 (Sitting, Left Arm, Standard)       Physical Exam Charles Gave M. White MD; 02/03/2018 4:53 PM) The physical exam findings are as follows: Note:Gen: NAD, comfortable CV: RRR GI: Abdomen is soft, not significantly tender, nondistended. All incisions are clean/dry/intact without erythema. There is no drainage. There is some point tenderness just lateral to his Charles [name] incision likely at the edge of the fascial closure. No palpable hernias    Assessment & Plan Charles Gave M. White MD; 02/03/2018 5:02 PM) RECTAL CANCER (C20) Story: Charles [name] is a very pleasant 24yoM with HTN, HLD, DM here today for f/u - cT3cN1M0 invasive adenoCA of the rectum - 12cm from verge on scope, 5.3cm from verge on MRI; not palpable on physical exam; CEA 0.7 He has completed systemic chemotherapy and now  chemoXRT - last day was 11/22/17 Now s/p robotic-assisted low anterior resection 01/17/2018 Pathology returned ypT1N1aM0, MMR preserved Impression: -I recommended he began taking a fiber supplement such as Metamucil or Benefiber to help bind his stool and also potentially treat any potential underlying constipation which may be contributing. His discomfort appears to be musculoskeletal given that it is brought on by torquing of his torso and activity and alleviated by rest. I recommend he continue taking Tylenol and as needed ibuprofen. Should he need it tramadol for worsening pains. We discussed warning signs including fever/chills/nausea/vomiting or abdominal distention. -Both myself and Dr. Benay Spice have discussed the pathology with him and NCCN recommendations for surveillance of colorectal cancer moving forward -I answered all his questions today -We will plan to see him back in 3-4 weeks for follow-up or sooner if necessary  Signed electronically by Ileana Roup, MD (02/03/2018 5:03 PM)

## 2018-02-12 ENCOUNTER — Encounter (HOSPITAL_COMMUNITY): Payer: Self-pay | Admitting: *Deleted

## 2018-02-12 ENCOUNTER — Other Ambulatory Visit: Payer: Self-pay

## 2018-02-12 NOTE — Progress Notes (Signed)
Patient has Freestyle continuous blood glucose monitoring system.  Instructed patient to remove prior to surgery on 02/17/2018 and to bring supplies day of surgery to place back on .

## 2018-02-17 ENCOUNTER — Ambulatory Visit (HOSPITAL_COMMUNITY)
Admission: RE | Admit: 2018-02-17 | Discharge: 2018-02-17 | Disposition: A | Discharge status: Home / Self Care | Payer: 59 | Attending: Surgery | Admitting: Surgery

## 2018-02-17 ENCOUNTER — Encounter (HOSPITAL_COMMUNITY): Payer: Self-pay | Admitting: *Deleted

## 2018-02-17 ENCOUNTER — Ambulatory Visit (HOSPITAL_COMMUNITY): Payer: 59 | Admitting: Anesthesiology

## 2018-02-17 ENCOUNTER — Encounter (HOSPITAL_COMMUNITY)
Admission: RE | Disposition: A | Discharge status: Home / Self Care | Payer: Self-pay | Source: Home / Self Care | Attending: Surgery

## 2018-02-17 DIAGNOSIS — E785 Hyperlipidemia, unspecified: Secondary | ICD-10-CM | POA: Diagnosis not present

## 2018-02-17 DIAGNOSIS — Z85048 Personal history of other malignant neoplasm of rectum, rectosigmoid junction, and anus: Secondary | ICD-10-CM | POA: Diagnosis not present

## 2018-02-17 DIAGNOSIS — E669 Obesity, unspecified: Secondary | ICD-10-CM | POA: Diagnosis not present

## 2018-02-17 DIAGNOSIS — Z6830 Body mass index (BMI) 30.0-30.9, adult: Secondary | ICD-10-CM | POA: Diagnosis not present

## 2018-02-17 DIAGNOSIS — I10 Essential (primary) hypertension: Secondary | ICD-10-CM | POA: Diagnosis not present

## 2018-02-17 DIAGNOSIS — E1140 Type 2 diabetes mellitus with diabetic neuropathy, unspecified: Secondary | ICD-10-CM | POA: Diagnosis not present

## 2018-02-17 DIAGNOSIS — Z452 Encounter for adjustment and management of vascular access device: Secondary | ICD-10-CM | POA: Insufficient documentation

## 2018-02-17 DIAGNOSIS — Z9221 Personal history of antineoplastic chemotherapy: Secondary | ICD-10-CM | POA: Diagnosis not present

## 2018-02-17 DIAGNOSIS — E7849 Other hyperlipidemia: Secondary | ICD-10-CM | POA: Diagnosis not present

## 2018-02-17 DIAGNOSIS — Z923 Personal history of irradiation: Secondary | ICD-10-CM | POA: Diagnosis not present

## 2018-02-17 DIAGNOSIS — K648 Other hemorrhoids: Secondary | ICD-10-CM | POA: Insufficient documentation

## 2018-02-17 HISTORY — PX: PORT-A-CATH REMOVAL: SHX5289

## 2018-02-17 LAB — BASIC METABOLIC PANEL
Anion gap: 9 (ref 5–15)
BUN: 12 mg/dL (ref 6–20)
CALCIUM: 9.2 mg/dL (ref 8.9–10.3)
CHLORIDE: 105 mmol/L (ref 98–111)
CO2: 24 mmol/L (ref 22–32)
CREATININE: 0.81 mg/dL (ref 0.61–1.24)
GFR calc Af Amer: >60 mL/min (ref >60)
GFR calc non Af Amer: >60 mL/min (ref >60)
Glucose, Bld: 195 mg/dL — ABNORMAL HIGH (ref 70–99)
Potassium: 3.9 mmol/L (ref 3.5–5.1)
Sodium: 138 mmol/L (ref 135–145)

## 2018-02-17 LAB — GLUCOSE, CAPILLARY
Glucose-Capillary: 213 mg/dL — ABNORMAL HIGH (ref 70–99)
Glucose-Capillary: 169 mg/dL — ABNORMAL HIGH (ref 70–99)

## 2018-02-17 LAB — CBC WITH DIFFERENTIAL/PLATELET
Abs Immature Granulocytes: 0.02 10*3/uL (ref 0.00–0.07)
Basophils Absolute: 0 10*3/uL (ref 0.0–0.1)
Basophils Relative: 1 %
Eosinophils Absolute: 0.1 10*3/uL (ref 0.0–0.5)
Eosinophils Relative: 2 %
HCT: 38.4 % — ABNORMAL LOW (ref 39.0–52.0)
Hemoglobin: 12.8 g/dL — ABNORMAL LOW (ref 13.0–17.0)
Immature Granulocytes: 0 %
LYMPHS ABS: 0.5 10*3/uL — AB (ref 0.7–4.0)
Lymphocytes Relative: 10 %
MCH: 31.4 pg (ref 26.0–34.0)
MCHC: 33.3 g/dL (ref 30.0–36.0)
MCV: 94.3 fL (ref 80.0–100.0)
Monocytes Absolute: 0.4 10*3/uL (ref 0.1–1.0)
Monocytes Relative: 7 %
Neutro Abs: 4.1 10*3/uL (ref 1.7–7.7)
Neutrophils Relative %: 80 %
Platelets: 175 10*3/uL (ref 150–400)
RBC: 4.07 MIL/uL — ABNORMAL LOW (ref 4.22–5.81)
RDW: 12.3 % (ref 11.5–15.5)
WBC: 5.1 10*3/uL (ref 4.0–10.5)
nRBC: 0 % (ref 0.0–0.2)

## 2018-02-17 LAB — PROTIME-INR
INR: 0.99
Prothrombin Time: 13 seconds (ref 11.4–15.2)

## 2018-02-17 LAB — APTT: aPTT: 25 seconds (ref 24–36)

## 2018-02-17 SURGERY — REMOVAL PORT-A-CATH
Anesthesia: Monitor Anesthesia Care | Site: Chest | Laterality: Right

## 2018-02-17 MED ORDER — FENTANYL CITRATE (PF) 100 MCG/2ML IJ SOLN: 25.0000 ug | INTRAMUSCULAR | Status: DC | PRN

## 2018-02-17 MED ORDER — LIDOCAINE-EPINEPHRINE 1 %-1:100000 IJ SOLN
INTRAMUSCULAR | Status: AC
  Filled 2018-02-17: qty 1

## 2018-02-17 MED ORDER — PROPOFOL 10 MG/ML IV BOLUS
INTRAVENOUS | Status: AC
  Filled 2018-02-17: qty 20

## 2018-02-17 MED ORDER — ONDANSETRON HCL 4 MG/2ML IJ SOLN
INTRAMUSCULAR | Status: DC | PRN
  Administered 2018-02-17: 4 mg via INTRAVENOUS

## 2018-02-17 MED ORDER — BUPIVACAINE HCL (PF) 0.25 % IJ SOLN
INTRAMUSCULAR | Status: AC
  Filled 2018-02-17: qty 30

## 2018-02-17 MED ORDER — PROMETHAZINE HCL 25 MG/ML IJ SOLN: 6.2500 mg | INTRAMUSCULAR | Status: DC | PRN

## 2018-02-17 MED ORDER — PROPOFOL 10 MG/ML IV BOLUS
INTRAVENOUS | Status: DC | PRN
  Administered 2018-02-17: 10 mg via INTRAVENOUS

## 2018-02-17 MED ORDER — MIDAZOLAM HCL 2 MG/2ML IJ SOLN
INTRAMUSCULAR | Status: AC
  Filled 2018-02-17: qty 2

## 2018-02-17 MED ORDER — ONDANSETRON HCL 4 MG/2ML IJ SOLN
INTRAMUSCULAR | Status: AC
  Filled 2018-02-17: qty 2

## 2018-02-17 MED ORDER — OXYCODONE HCL 5 MG PO TABS: 5.0000 mg | ORAL_TABLET | Freq: Once | ORAL | Status: DC | PRN

## 2018-02-17 MED ORDER — BUPIVACAINE HCL 0.25 % IJ SOLN
INTRAMUSCULAR | Status: DC | PRN
  Administered 2018-02-17: 10 mL

## 2018-02-17 MED ORDER — 0.9 % SODIUM CHLORIDE (POUR BTL) OPTIME
TOPICAL | Status: DC | PRN
  Administered 2018-02-17: 1000 mL

## 2018-02-17 MED ORDER — OXYCODONE HCL 5 MG/5ML PO SOLN
5.0000 mg | Freq: Once | ORAL | Status: DC | PRN
  Filled 2018-02-17: qty 5

## 2018-02-17 MED ORDER — PROPOFOL 10 MG/ML IV BOLUS
INTRAVENOUS | Status: AC
  Filled 2018-02-17: qty 40

## 2018-02-17 MED ORDER — ACETAMINOPHEN 500 MG PO TABS
1000.0000 mg | ORAL_TABLET | ORAL | Status: AC
  Administered 2018-02-17: 1000 mg via ORAL
  Filled 2018-02-17: qty 2

## 2018-02-17 MED ORDER — FENTANYL CITRATE (PF) 100 MCG/2ML IJ SOLN
INTRAMUSCULAR | Status: AC
  Filled 2018-02-17: qty 2

## 2018-02-17 MED ORDER — MIDAZOLAM HCL 5 MG/5ML IJ SOLN
INTRAMUSCULAR | Status: DC | PRN
  Administered 2018-02-17: 2 mg via INTRAVENOUS

## 2018-02-17 MED ORDER — PROPOFOL 500 MG/50ML IV EMUL
INTRAVENOUS | Status: DC | PRN
  Administered 2018-02-17: 150 ug/kg/min via INTRAVENOUS

## 2018-02-17 MED ORDER — CHLORHEXIDINE GLUCONATE CLOTH 2 % EX PADS: 6.0000 | MEDICATED_PAD | Freq: Once | CUTANEOUS | Status: DC

## 2018-02-17 MED ORDER — LIDOCAINE-EPINEPHRINE 1 %-1:100000 IJ SOLN
INTRAMUSCULAR | Status: DC | PRN
  Administered 2018-02-17: 10 mL

## 2018-02-17 MED ORDER — FENTANYL CITRATE (PF) 100 MCG/2ML IJ SOLN
INTRAMUSCULAR | Status: DC | PRN
  Administered 2018-02-17 (×2): 50 ug via INTRAVENOUS

## 2018-02-17 MED ORDER — LACTATED RINGERS IV SOLN
INTRAVENOUS | Status: DC | PRN
  Administered 2018-02-17: 10:00:00 via INTRAVENOUS

## 2018-02-17 MED ORDER — CLINDAMYCIN PHOSPHATE 900 MG/50ML IV SOLN
900.0000 mg | INTRAVENOUS | Status: AC
  Administered 2018-02-17: 900 mg via INTRAVENOUS
  Filled 2018-02-17: qty 50

## 2018-02-17 SURGICAL SUPPLY — 26 items
BLADE SURG 15 STRL LF DISP TIS (BLADE) ×1 IMPLANT
BLADE SURG 15 STRL SS (BLADE) ×1
CHLORAPREP W/TINT 26ML (MISCELLANEOUS) ×2 IMPLANT
COVER SURGICAL LIGHT HANDLE (MISCELLANEOUS) ×2 IMPLANT
COVER WAND RF STERILE (DRAPES) IMPLANT
DECANTER SPIKE VIAL GLASS SM (MISCELLANEOUS) ×2 IMPLANT
DERMABOND ADVANCED (GAUZE/BANDAGES/DRESSINGS) ×1
DERMABOND ADVANCED .7 DNX12 (GAUZE/BANDAGES/DRESSINGS) ×1 IMPLANT
DRAPE LAPAROTOMY TRNSV 102X78 (DRAPE) ×2 IMPLANT
ELECT COATED BLADE 2.86 ST (ELECTRODE) IMPLANT
ELECT PENCIL ROCKER SW 15FT (MISCELLANEOUS) ×2 IMPLANT
ELECT REM PT RETURN 15FT ADLT (MISCELLANEOUS) ×2 IMPLANT
GAUZE 4X4 16PLY RFD (DISPOSABLE) ×2 IMPLANT
GAUZE SPONGE 4X4 12PLY STRL (GAUZE/BANDAGES/DRESSINGS) IMPLANT
GLOVE SURG SIGNA 7.5 PF LTX (GLOVE) ×2 IMPLANT
GOWN STRL REUS W/TWL XL LVL3 (GOWN DISPOSABLE) ×4 IMPLANT
KIT BASIN OR (CUSTOM PROCEDURE TRAY) ×2 IMPLANT
NEEDLE HYPO 22GX1.5 SAFETY (NEEDLE) ×2 IMPLANT
PACK BASIC VI WITH GOWN DISP (CUSTOM PROCEDURE TRAY) ×2 IMPLANT
STRIP CLOSURE SKIN 1/4X4 (GAUZE/BANDAGES/DRESSINGS) IMPLANT
SUT MNCRL AB 4-0 PS2 18 (SUTURE) ×2 IMPLANT
SUT VIC AB 3-0 SH 18 (SUTURE) ×2 IMPLANT
SYR BULB IRRIGATION 50ML (SYRINGE) ×2 IMPLANT
SYR CONTROL 10ML LL (SYRINGE) ×2 IMPLANT
TOWEL OR 17X26 10 PK STRL BLUE (TOWEL DISPOSABLE) ×2 IMPLANT
YANKAUER SUCT BULB TIP 10FT TU (MISCELLANEOUS) ×2 IMPLANT

## 2018-02-17 NOTE — Discharge Instructions (Signed)
POST OP INSTRUCTIONS  DIET: As tolerated.   1. Take your usually prescribed home medications unless otherwise directed.  2. PAIN CONTROL: a. Pain is best controlled by a usual combination of three different methods TOGETHER: i. Ice/Heat ii. Over the counter pain medication iii. Prescription pain medication b. Most patients will experience some swelling and bruising around the surgical site.  Ice packs or heating pads (30-60 minutes up to 6 times a day) will help. Some people prefer to use ice alone, heat alone, alternating between ice & heat.  Experiment to what works for you.  Swelling and bruising can take several weeks to resolve.   c. It is helpful to take an over-the-counter pain medication regularly for the first few weeks: i. Ibuprofen (Motrin/Advil) - 200mg  tabs - take 3 tabs (600mg ) every 6 hours as needed for pain ii. Acetaminophen (Tylenol) - you may take 650mg  every 6 hours as needed. You can take this with motrin as they act differently on the body. If you are taking a narcotic pain medication that has acetaminophen in it, do not take over the counter tylenol at the same time.  Iii. NOTE: You may take both of these medications together - most patients  find it most helpful when alternating between the two (i.e. Ibuprofen at 6am,  tylenol at 9am, ibuprofen at 12pm ...)  3. Dressing: Your incision is covered in Dermabond which is like sterile superglue for the skin. This will come off on it's own in a couple weeks. It is waterproof and you may bathe normally starting the day after your surgery in a shower. Avoid baths/pools/lakes/oceans until your wounds have fully healed.  4. ACTIVITIES as tolerated:   a. Avoid heavy lifting (>10lbs or 1 gallon of milk) until you are 6 weeks out from your rectal cancer surgery. b. You may resume regular (light) daily activities beginning the next day--such as daily self-care, walking, climbing stairs--gradually increasing activities as tolerated.  If  you can walk 30 minutes without difficulty, it is safe to try more intense activity such as jogging, treadmill, bicycling, low-impact aerobics.  c. DO NOT PUSH THROUGH PAIN.  Let pain be your guide: If it hurts to do something, don't do it. d. Charles [name] may drive when you are no longer taking prescription pain medication, you can comfortably wear a seatbelt, and you can safely maneuver your car and apply brakes. e. Charles [name] may have sexual intercourse when it is comfortable.   5. FOLLOW UP in our office a. Please call [facility] at [phone] to set up an appointment to see your surgeon in the office for a follow-up appointment approximately 2 weeks after your surgery. b. Make sure that you call for this appointment the day you arrive home to insure a convenient appointment time.  9. If you have disability or family leave forms that need to be completed, you may have them completed by your primary care physician's office; for return to work instructions, please ask our office staff and they will be happy to assist you in obtaining this documentation   When to call us [phone]: 1. Poor pain control 2. Reactions / problems with new medications (rash/itching, etc)  3. Fever over 101.5 F (38.5 C) 4. Inability to urinate 5. Nausea/vomiting 6. Worsening swelling or bruising 7. Continued bleeding from incision. 8. Increased pain, redness, or drainage from the incision  The clinic staff is available to answer your questions during regular business hours (8:30am-5pm).  Please dont hesitate to call  and ask to speak to one of our nurses for clinical concerns.   A surgeon from [facility] is always on call at the hospitals   If you have a medical emergency, go to the nearest emergency room or call 911.  [facility], [place] [address] [place], [place], [place]  [address] MAIN: [phone] FAX: [phone] [email or web address]

## 2018-02-17 NOTE — Op Note (Signed)
02/17/2018  11:09 AM  PATIENT:  Charles [name]  53 y.o. male  Patient Care Team: Lujean Amel, MD as PCP - General (Family Medicine) Ileana Roup, MD as Consulting Physician (General Surgery)  PRE-OPERATIVE DIAGNOSIS:  History of rectal cancer; port-a-cath in place  POST-OPERATIVE DIAGNOSIS:  History of rectal cancer  PROCEDURE:  Removal of port-a-cath  SURGEON:  Sharon Mt. Leda [place], MD  ASSISTANT: OR staff  ANESTHESIA:   MAC  COUNTS:  Sponge, needle and instrument counts were reported correct x2 at the conclusion of the operation.  EBL: 1 cc  DRAINS: None  SPECIMEN: None  COMPLICATIONS: None  FINDINGS: Right IJ port-a-cath removed uneventfully and intact.  DISPOSITION: PACU in satisfactory condition  INDICATION: [name] is a very pleasant 53 year old gentleman with locally advanced rectal cancer. He underwent total neoadjuvant therapy with a good response. He was taken to the OR 01/17/2018 for a robotic low anterior resection. He recovered from this procedure well. His case was reviewed at our multidisciplinary tumor board and no further therapy was planned at this time. Oncology requested we remove his port.  DESCRIPTION: The patient was identified in preop holding and taken to the OR where he was placed on the operating room table. SCDs were placed. MAC anesthesia was induced without difficulty. He was then prepped and draped in the usual sterile fashion. A surgical timeout was performed indicating the correct patient, procedure, positioning and need for preoperative antibiotics.   Local anesthetic consisting of Marcaine diluted with 1% lidocaine was infiltrated as a field block.  A skin incision was made excising the old scar over his right chest wall port.  Subcutaneous tissue was divided using electrocautery.  The capsule encasing the port was identified and incised.  The port was then grasped with a clamp and all 3 Prolene sutures were cut and removed.   Pressure was then applied to the puncture site where the port tubing entered the right IJ.  The port was then removed without difficulty and inspected.  It was completely intact.  The tip of the port tubing was also present.  This was then passed off.  A figure-of-eight 2-0 Vicryl suture was placed at the place where the tubing exited into the port capsule.  Pressure was continually applied to the IJ puncture site for 5 minutes.  This was then released.  The wound was then inspected and noted to be completely hemostatic.  The back wall of the capsule was then fulgurated with electrocautery.  The wound was then closed in 2 layers.  Deep dermal 3-0 Vicryl sutures were placed.  The skin is then closed with a running 4-0 Monocryl subcuticular suture.  Dermabond was then applied to the skin.  He was then awakened from anesthesia, transferred to a stretcher and transferred to PACU in satisfactory condition.

## 2018-02-17 NOTE — Anesthesia Preprocedure Evaluation (Addendum)
Anesthesia Evaluation  Patient identified by MRN, date of birth, ID band Patient awake    Reviewed: Allergy & Precautions, NPO status , Patient's Chart, lab work & pertinent test results  History of Anesthesia Complications Negative for: history of anesthetic complications  Airway Mallampati: II  TM Distance: >3 FB Neck ROM: Full    Dental  (+) Dental Advisory Given, Teeth Intact   Pulmonary   Possible undiagnosed OSA    breath sounds clear to auscultation       Cardiovascular negative cardio ROS   Rhythm:Regular Rate:Normal     Neuro/Psych  Neuromuscular disease (neuropathy) negative psych ROS   GI/Hepatic Neg liver ROS,  Rectal cancer s/p rectosigmoid resection, chemo, and radiation     Endo/Other  diabetes, Type 2, Insulin Dependent Obesity   Renal/GU negative Renal ROS     Musculoskeletal negative musculoskeletal ROS (+)   Abdominal   Peds  Hematology negative hematology ROS (+)   Anesthesia Other Findings   Reproductive/Obstetrics                            Anesthesia Physical Anesthesia Plan  ASA: III  Anesthesia Plan: MAC   Post-op Pain Management:    Induction: Intravenous  PONV Risk Score and Plan: 1 and Propofol infusion and Treatment may vary due to age or medical condition  Airway Management Planned: Natural Airway and Mask  Additional Equipment: None  Intra-op Plan:   Post-operative Plan:   Informed Consent: I have reviewed the patients History and Physical, chart, labs and discussed the procedure including the risks, benefits and alternatives for the proposed anesthesia with the patient or authorized representative who has indicated his/her understanding and acceptance.       Plan Discussed with: CRNA, Anesthesiologist and Surgeon  Anesthesia Plan Comments:       Anesthesia Quick Evaluation

## 2018-02-17 NOTE — Interval H&P Note (Signed)
History and Physical Interval Note:  02/17/2018 9:57 AM  Charles [name]  has presented today for surgery, with the diagnosis of HX OF RECTAL CANCER - Port-a-cath in place.  The various methods of treatment have been discussed with the patient and family. After consideration of risks, benefits and other options for treatment, the patient has consented to  Procedure(s): REMOVAL OF PORT-A-CATH (N/A) as a surgical intervention .  The patient's history has been reviewed, patient examined, no change in status, stable for surgery.  I have reviewed the patient's chart and labs. We discussed the technical aspects of port-a-cath removal, material risks (including but not limited to [name], scarring, infection, bleeding, need for blood transfusion, ultimately needing a port replaced, damage to surrounding structures, need for additional procedures) benefits and alternatives were reviewed with him today.  Questions were answered to the patient's satisfaction, he expressed understanding and elected to proceed with the procedure.   [place]

## 2018-02-17 NOTE — Transfer of Care (Signed)
Immediate Anesthesia Transfer of Care Note  Patient: Brodrick Curran  Procedure(s) Performed: REMOVAL OF PORT-A-CATH (Right Chest)  Patient Location: PACU  Anesthesia Type:MAC  Level of Consciousness: awake, alert  and oriented  Airway & Oxygen Therapy: Patient Spontanous Breathing and Patient connected to face mask oxygen  Post-op Assessment: Report given to RN and Post -op Vital signs reviewed and stable  Post vital signs: Reviewed and stable  Last Vitals:  Vitals Value Taken Time  BP    Temp    Pulse 108 02/17/2018 11:15 AM  Resp 11 02/17/2018 11:15 AM  SpO2 97 % 02/17/2018 11:15 AM  Vitals shown include unvalidated device data.  Last Pain:  Vitals:   02/17/18 1004  PainSc: 2          Complications: No apparent anesthesia complications

## 2018-02-17 NOTE — Anesthesia Procedure Notes (Signed)
Procedure Name: LMA Insertion Date/Time: 02/17/2018 10:32 AM Performed by: Maxwell Caul, CRNA Pre-anesthesia Checklist: Patient identified, Emergency Drugs available, Suction available and Patient being monitored Oxygen Delivery Method: Simple face mask

## 2018-02-17 NOTE — Anesthesia Postprocedure Evaluation (Signed)
Anesthesia Post Note  Patient: Charles [name]  Procedure(s) Performed: REMOVAL OF PORT-A-CATH (Right Chest)     Patient location during evaluation: PACU Anesthesia Type: MAC Level of consciousness: awake and alert Pain management: pain level controlled Vital Signs Assessment: post-procedure vital signs reviewed and stable Respiratory status: spontaneous breathing, nonlabored ventilation and respiratory function stable Cardiovascular status: stable and blood pressure returned to baseline Anesthetic complications: no    Last Vitals:  Vitals:   02/17/18 1205 02/17/18 1217  BP: 114/72 123/69  Pulse: 98 97  Resp: 11 14  Temp: 36.4 C 36.4 C  SpO2: 95% 95%    Last Pain:  Vitals:   02/17/18 1205  PainSc: [place]

## 2018-02-18 ENCOUNTER — Encounter (HOSPITAL_COMMUNITY): Payer: Self-pay | Admitting: Surgery

## 2018-03-06 DIAGNOSIS — L03032 Cellulitis of left toe: Secondary | ICD-10-CM | POA: Diagnosis not present

## 2018-03-13 ENCOUNTER — Telehealth: Payer: Self-pay | Admitting: Oncology

## 2018-03-13 NOTE — Telephone Encounter (Signed)
Call day moved appts from 03/17 to 3/27. Confirmed with patient.

## 2018-03-20 DIAGNOSIS — L03032 Cellulitis of left toe: Secondary | ICD-10-CM | POA: Diagnosis not present

## 2018-03-25 ENCOUNTER — Other Ambulatory Visit: Payer: 59

## 2018-03-25 ENCOUNTER — Ambulatory Visit: Payer: 59 | Admitting: Oncology

## 2018-03-26 ENCOUNTER — Encounter: Payer: Self-pay | Admitting: *Deleted

## 2018-03-26 ENCOUNTER — Other Ambulatory Visit: Payer: Self-pay | Admitting: *Deleted

## 2018-03-26 NOTE — Telephone Encounter (Signed)
Refill request from [facility] for K+ 20 meq. Per Dr. Benay Spice, may d/c K+ now. Patient notified via [place].

## 2018-04-03 ENCOUNTER — Telehealth: Payer: Self-pay | Admitting: *Deleted

## 2018-04-03 NOTE — Telephone Encounter (Signed)
Called patient to reschedule his f/u with Dr. Benay Spice to 1 month or a phone appointment. He declines this and wants to see MD in person. He will need a statement in near future regarding this LTD and needs to have seen a physician. Confirmed he has no fever, cough, shortness of breath or travel out of [place] in last 2 weeks. Informed him he will need to come unaccompanied.

## 2018-04-04 ENCOUNTER — Telehealth: Payer: Self-pay | Admitting: *Deleted

## 2018-04-04 ENCOUNTER — Inpatient Hospital Stay: Payer: 59 | Admitting: Oncology

## 2018-04-04 ENCOUNTER — Inpatient Hospital Stay: Payer: 59 | Attending: Oncology

## 2018-04-04 ENCOUNTER — Other Ambulatory Visit: Payer: Self-pay

## 2018-04-04 VITALS — BP 124/71 | HR 103 | Temp 98.4°F | Resp 18 | Ht 68.0 in | Wt 203.2 lb

## 2018-04-04 DIAGNOSIS — C20 Malignant neoplasm of rectum: Secondary | ICD-10-CM

## 2018-04-04 DIAGNOSIS — G629 Polyneuropathy, unspecified: Secondary | ICD-10-CM | POA: Diagnosis not present

## 2018-04-04 DIAGNOSIS — E1140 Type 2 diabetes mellitus with diabetic neuropathy, unspecified: Secondary | ICD-10-CM | POA: Diagnosis not present

## 2018-04-04 LAB — CMP (CANCER CENTER ONLY)
ALT: 16 U/L (ref 0–44)
AST: 11 U/L — ABNORMAL LOW (ref 15–41)
Albumin: 3.8 g/dL (ref 3.5–5.0)
Alkaline Phosphatase: 123 U/L (ref 38–126)
Anion gap: 6 (ref 5–15)
BUN: 11 mg/dL (ref 6–20)
CHLORIDE: 105 mmol/L (ref 98–111)
CO2: 28 mmol/L (ref 22–32)
Calcium: 9.6 mg/dL (ref 8.9–10.3)
Creatinine: 0.95 mg/dL (ref 0.61–1.24)
GFR, Est AFR Am: >60 mL/min (ref >60)
Glucose, Bld: 208 mg/dL — ABNORMAL HIGH (ref 70–99)
Potassium: 4.5 mmol/L (ref 3.5–5.1)
Sodium: 139 mmol/L (ref 135–145)
Total Bilirubin: 0.6 mg/dL (ref 0.3–1.2)
Total Protein: 7.4 g/dL (ref 6.5–8.1)

## 2018-04-04 LAB — CEA (IN HOUSE-CHCC): CEA (CHCC-IN HOUSE): 1.17 ng/mL (ref 0.00–5.00)

## 2018-04-04 NOTE — Telephone Encounter (Signed)
Notified of normal CEA. 

## 2018-04-04 NOTE — Progress Notes (Signed)
[facility] OFFICE PROGRESS NOTE   Diagnosis: Rectal cancer  INTERVAL HISTORY:    Charles [name] returns for a scheduled visit.  Good appetite.  His bowels are functioning.  The 5-fluorouracil skin changes have resolved.  He continues to have neuropathy symptoms.  He reports numbness, tingling, and a cold feeling in his fingers and toes.  Gabapentin helps partially.  He took Cymbalta for only short time.  Gabapentin was started by Dr. Dema Severin when he had postoperative pain.  He has mild difficulty buttoning his shirt and has to be careful with proprioception with the feet.  He would like to return to work.  Objective:  Vital signs in last 24 hours:  Blood pressure 124/71, pulse (!) 103, temperature 98.4 F (36.9 C), temperature source Oral, resp. rate 18, height '5\' 8"'  (1.727 m), weight 203 lb 3.2 oz (92.2 kg), SpO2 100 %.   Physical examination-not performed today  Lab Results:  Lab Results  Component Value Date   WBC 5.1 02/17/2018   HGB 12.8 (L) 02/17/2018   HCT 38.4 (L) 02/17/2018   MCV 94.3 02/17/2018   PLT 175 02/17/2018   NEUTROABS 4.1 02/17/2018    CMP  Lab Results  Component Value Date   NA 138 02/17/2018   K 3.9 02/17/2018   CL 105 02/17/2018   CO2 24 02/17/2018   GLUCOSE 195 (H) 02/17/2018   BUN 12 02/17/2018   CREATININE 0.81 02/17/2018   CALCIUM 9.2 02/17/2018   PROT 7.6 01/13/2018   ALBUMIN 4.2 01/13/2018   AST 20 01/13/2018   ALT 27 01/13/2018   ALKPHOS 100 01/13/2018   BILITOT 0.7 01/13/2018   GFRNONAA >60 02/17/2018   GFRAA >60 02/17/2018     Medications: I have reviewed the patient's current medications.   Assessment/Plan: 1. Rectal cancer, clinical stage III ? Colonoscopy 05/24/2017, mass at 12 cm from the anal verge, biopsy confirmed invasive poorly differentiated adenocarcinoma ? CTs 06/06/2017-rectal mass, perirectal adenopathy, no evidence of metastatic disease, nonspecific 1 cm sclerotic lesion at the right iliac crest ?  MRI 06/06/2017, T3cN1tumor measured at 5.3 cm from the anal sphincter ? Cycle 1 FOLFOX 06/20/2017 ? Cycle 2 FOLFOX 07/04/2017 ? Cycle 3 FOLFOX 07/17/2017 ? Cycle 4 FOLFOX 08/01/2017 (Emend added secondary to prolonged nausea following chemotherapy) ? Cycle 5 FOLFOX 08/15/2017 ? Cycle 6 FOLFOX 08/29/2017 ? Cycle 7 FOLFOX 09/12/2017 ? Cycle 8 FOLFOX 09/26/2017 (dose reduced due to neuropathy and thrombocytopenia) ? Initiation of radiation and capecitabine 10/16/2017, completed 11/22/2017 ? Restaging CTs 12/10/2017- no evidence of metastatic disease, decrease in the rectal mass and near complete resolution of perirectal lymphadenopathy  ? Status post low anterior resection 01/17/2018-1 cm poorly differentiated adenocarcinoma; tumor limited to submucosa; margins negative; metastatic carcinoma in 1 of 18 lymph nodes; isolated tumor cells in 1 lymph node; treatment effect present; no loss of mismatch repair protein expression  2. Diabetes 3. Glaucoma 4. Cecal polyp, tubular adenoma noted on the colonoscopy 05/24/2017 5. Port-A-Cath placement,Dr. White,06/17/2017 6. Oxaliplatinneuropathy    Disposition: Mr. Gilmer is in clinical remission from rectal cancer.  He continues to be symptomatic with oxaliplatin neuropathy.  Gabapentin helps partially.  He will continue the gabapentin.  He will contact us if the neuropathy symptoms do not improve over the next few months and we will consider resuming Cymbalta or trying amitriptyline.  We will follow-up on the CEA from today.  He plans to return to work 05/09/2018.  I will refer him to Dr. Michail Sermon for a surveillance  colonoscopy.  Mr. Hudnall will return for an office visit in 3 months.  Betsy Coder, MD  04/04/2018  11:12 AM

## 2018-04-04 NOTE — Telephone Encounter (Signed)
-----   Message from Ladell Pier, MD sent at 04/04/2018  4:03 PM EDT ----- Please call patient, CEA is normal, follow-up as scheduled

## 2018-04-07 ENCOUNTER — Telehealth: Payer: Self-pay | Admitting: Oncology

## 2018-04-07 NOTE — Telephone Encounter (Signed)
Scheduled per 03/27 los Patient is aware of updated appointment.

## 2018-04-08 NOTE — Telephone Encounter (Signed)
GI referral sent to Dr. Kathline Magic office at [place] via [facility]. Office will contact patient.

## 2018-05-06 DIAGNOSIS — H401131 Primary open-angle glaucoma, bilateral, mild stage: Secondary | ICD-10-CM | POA: Diagnosis not present

## 2018-05-15 DIAGNOSIS — C20 Malignant neoplasm of rectum: Secondary | ICD-10-CM | POA: Diagnosis not present

## 2018-05-15 DIAGNOSIS — M79672 Pain in left foot: Secondary | ICD-10-CM | POA: Diagnosis not present

## 2018-05-16 DIAGNOSIS — Z794 Long term (current) use of insulin: Secondary | ICD-10-CM | POA: Diagnosis not present

## 2018-05-16 DIAGNOSIS — E1165 Type 2 diabetes mellitus with hyperglycemia: Secondary | ICD-10-CM | POA: Diagnosis not present

## 2018-05-16 DIAGNOSIS — E1142 Type 2 diabetes mellitus with diabetic polyneuropathy: Secondary | ICD-10-CM | POA: Diagnosis not present

## 2018-06-06 ENCOUNTER — Telehealth: Payer: Self-pay | Admitting: *Deleted

## 2018-06-06 NOTE — Telephone Encounter (Addendum)
Was on LTD and returned to work on 05/09/18, but due to continued abdominal pain and neuropathy he has had difficulty with full time schedule. Abdominal pain worsens after prolonged sitting, which then requires him to lie reclined to work on his laptop to complete work. Employer said if LTD is initiated again by MD, he could have this and work part-time. He saw Dr. Dema Severin last week and he defers this to Dr. Benay Spice since Dr. Benay Spice is the physician that put him on LDT initially. Would like to inquire if Dr. Benay Spice would agree to this before forms are sent in to office to be completed. Dr. Benay Spice agrees to help with his disability request. Patient requests start date of 06/24/18 and requested to move his OV to June 12th.

## 2018-06-19 ENCOUNTER — Other Ambulatory Visit: Payer: Self-pay | Admitting: Surgery

## 2018-06-19 DIAGNOSIS — C20 Malignant neoplasm of rectum: Secondary | ICD-10-CM

## 2018-06-20 ENCOUNTER — Other Ambulatory Visit: Payer: Self-pay

## 2018-06-20 ENCOUNTER — Inpatient Hospital Stay: Payer: 59 | Attending: Oncology | Admitting: Oncology

## 2018-06-20 VITALS — BP 124/79 | HR 102 | Temp 98.3°F | Resp 17 | Ht 68.0 in | Wt 203.7 lb

## 2018-06-20 DIAGNOSIS — E1140 Type 2 diabetes mellitus with diabetic neuropathy, unspecified: Secondary | ICD-10-CM | POA: Insufficient documentation

## 2018-06-20 DIAGNOSIS — G620 Drug-induced polyneuropathy: Secondary | ICD-10-CM | POA: Diagnosis not present

## 2018-06-20 DIAGNOSIS — C20 Malignant neoplasm of rectum: Secondary | ICD-10-CM | POA: Insufficient documentation

## 2018-06-20 NOTE — Progress Notes (Signed)
[place] OFFICE PROGRESS NOTE   Diagnosis: Rectal cancer  INTERVAL HISTORY:   Charles [name] returns as scheduled.  He continues to have low abdominal pain that is worse with prolonged sitting.  He continues to work from home.  He is unable to sit at a desk for prolonged time.  He has been able to alter his job so he can lay down.  He continues to have numbness and tingling in the extremities.  This has not changed.  He takes gabapentin at night.  Shirt buttoning has improved. He has approximately 3 bowel movements per day.  No incontinence.  Objective:  Vital signs in last 24 hours:  Blood pressure 124/79, pulse (!) 102, temperature 98.3 F (36.8 C), temperature source Oral, resp. rate 17, height '5\' 8"'  (1.727 m), weight 203 lb 11.2 oz (92.4 kg), SpO2 98 %.    Lymphatics: No cervical, supraclavicular, or inguinal nodes GI: No hepatomegaly, nontender, no mass Vascular: No leg edema, the left lower leg is larger than the right side      Lab Results:  Lab Results  Component Value Date   WBC 5.1 02/17/2018   HGB 12.8 (L) 02/17/2018   HCT 38.4 (L) 02/17/2018   MCV 94.3 02/17/2018   PLT 175 02/17/2018   NEUTROABS 4.1 02/17/2018    CMP  Lab Results  Component Value Date   NA 139 04/04/2018   K 4.5 04/04/2018   CL 105 04/04/2018   CO2 28 04/04/2018   GLUCOSE 208 (H) 04/04/2018   BUN 11 04/04/2018   CREATININE 0.95 04/04/2018   CALCIUM 9.6 04/04/2018   PROT 7.4 04/04/2018   ALBUMIN 3.8 04/04/2018   AST 11 (L) 04/04/2018   ALT 16 04/04/2018   ALKPHOS 123 04/04/2018   BILITOT 0.6 04/04/2018   GFRNONAA >60 04/04/2018   GFRAA >60 04/04/2018    Lab Results  Component Value Date   CEA1 1.17 04/04/2018     Medications: I have reviewed the patient's current medications.   Assessment/Plan: 1. Rectal cancer, clinical stage III ? Colonoscopy 05/24/2017, mass at 12 cm from the anal verge, biopsy confirmed invasive poorly differentiated adenocarcinoma  ? CTs 06/06/2017-rectal mass, perirectal adenopathy, no evidence of metastatic disease, nonspecific 1 cm sclerotic lesion at the right iliac crest ? MRI 06/06/2017, T3cN1tumor measured at 5.3 cm from the anal sphincter ? Cycle 1 FOLFOX 06/20/2017 ? Cycle 2 FOLFOX 07/04/2017 ? Cycle 3 FOLFOX 07/17/2017 ? Cycle 4 FOLFOX 08/01/2017 (Emend added secondary to prolonged nausea following chemotherapy) ? Cycle 5 FOLFOX 08/15/2017 ? Cycle 6 FOLFOX 08/29/2017 ? Cycle 7 FOLFOX 09/12/2017 ? Cycle 8 FOLFOX 09/26/2017 (dose reduced due to neuropathy and thrombocytopenia) ? Initiation of radiation and capecitabine 10/16/2017, completed 11/22/2017 ? Restaging CTs 12/10/2017- no evidence of metastatic disease, decrease in the rectal mass and near complete resolution of perirectal lymphadenopathy  ? Status post low anterior resection 01/17/2018-1 cm poorly differentiated adenocarcinoma; tumor limited to submucosa; margins negative; metastatic carcinoma in 1 of 18 lymph nodes; isolated tumor cells in 1 lymph node; treatment effect present; no loss of mismatch repair protein expression  2. Diabetes 3. Glaucoma 4. Cecal polyp, tubular adenoma noted on the colonoscopy 05/24/2017 5. Port-A-Cath placement,Dr. White,06/17/2017 6. Oxaliplatinneuropathy   Disposition: Charles [name] appears unchanged.  He continues to have low abdominal pain.  He is scheduled for a CT within the next few weeks.  He has stable symptoms related to oxaliplatin neuropathy.  He does not wish to change medical therapy at present.  We will consider a trial of Cymbalta if the neuropathy symptoms do not improve over the next few months.  He is scheduled for a surveillance colonoscopy next week.  Charles [name] will return for an office visit and CEA in 3 months.  Betsy Coder, MD  06/20/2018  9:29 AM

## 2018-06-23 ENCOUNTER — Telehealth: Payer: Self-pay | Admitting: Oncology

## 2018-06-23 NOTE — Telephone Encounter (Signed)
Scheduled per los. Called and spoke with patient. Confirmed date and time °

## 2018-07-04 ENCOUNTER — Ambulatory Visit: Payer: 59 | Admitting: Oncology

## 2018-07-04 ENCOUNTER — Ambulatory Visit
Admission: RE | Admit: 2018-07-04 | Discharge: 2018-07-04 | Disposition: A | Discharge status: Home / Self Care | Payer: 59 | Source: Ambulatory Visit | Attending: Surgery | Admitting: Surgery

## 2018-07-04 DIAGNOSIS — C20 Malignant neoplasm of rectum: Secondary | ICD-10-CM

## 2018-07-04 MED ORDER — IOPAMIDOL (ISOVUE-300) INJECTION 61%
125.0000 mL | Freq: Once | INTRAVENOUS | Status: AC | PRN
  Administered 2018-07-04: 125 mL via INTRAVENOUS

## 2018-09-19 ENCOUNTER — Other Ambulatory Visit: Payer: Self-pay

## 2018-09-19 ENCOUNTER — Telehealth: Payer: Self-pay | Admitting: Oncology

## 2018-09-19 ENCOUNTER — Telehealth: Payer: Self-pay

## 2018-09-19 ENCOUNTER — Inpatient Hospital Stay: Payer: 59

## 2018-09-19 ENCOUNTER — Inpatient Hospital Stay: Payer: 59 | Attending: Oncology | Admitting: Oncology

## 2018-09-19 VITALS — BP 132/81 | HR 108 | Temp 98.3°F | Resp 19 | Ht 68.0 in | Wt 207.3 lb

## 2018-09-19 DIAGNOSIS — C20 Malignant neoplasm of rectum: Secondary | ICD-10-CM

## 2018-09-19 DIAGNOSIS — R109 Unspecified abdominal pain: Secondary | ICD-10-CM | POA: Diagnosis not present

## 2018-09-19 DIAGNOSIS — E119 Type 2 diabetes mellitus without complications: Secondary | ICD-10-CM | POA: Insufficient documentation

## 2018-09-19 DIAGNOSIS — G620 Drug-induced polyneuropathy: Secondary | ICD-10-CM | POA: Insufficient documentation

## 2018-09-19 DIAGNOSIS — R194 Change in bowel habit: Secondary | ICD-10-CM | POA: Insufficient documentation

## 2018-09-19 LAB — CEA (IN HOUSE-CHCC): CEA (CHCC-In House): 1.36 ng/mL (ref 0.00–5.00)

## 2018-09-19 NOTE — Telephone Encounter (Signed)
Called and left msg. Mailed printout  °

## 2018-09-19 NOTE — Telephone Encounter (Signed)
TC to pt per Dr. Benay Spice to let him know that CEA is normal. Pt verbalized understanding. No further problems or concerns at this time.

## 2018-09-19 NOTE — Addendum Note (Signed)
Addended by: Betsy Coder B on: 09/19/2018 10:09 AM   Modules accepted: Orders

## 2018-09-19 NOTE — Progress Notes (Signed)
  [facility] OFFICE PROGRESS NOTE   Diagnosis: Rectal cancer  INTERVAL HISTORY:   Charles [name] returns for a scheduled visit.  He continues to have numbness in the fingers and feet.  This has improved partially.  He has no difficulty performing activities with his hands.  He takes gabapentin intermittently at night. He reports irregular bowel habits.  He continues to have low abdominal pain with prolonged sitting. He reports undergoing a surveillance colonoscopy with Charles [name]. Objective:  Vital signs in last 24 hours:  Blood pressure 132/81, pulse (!) 108, temperature 98.3 F (36.8 C), temperature source Oral, resp. rate 19, height '5\' 8"'$  (1.727 m), weight 207 lb 4.8 oz (94 kg), SpO2 98 %.   Limited physical examination secondary to distancing with the COVID pandemic HEENT: Neck without mass Lymphatics: No cervical, supraclavicular, axillary, or inguinal nodes GI: No hepatosplenomegaly, no mass, nontender Vascular: No leg edema    Lab Results:  Lab Results  Component Value Date   WBC 5.1 02/17/2018   HGB 12.8 (L) 02/17/2018   HCT 38.4 (L) 02/17/2018   MCV 94.3 02/17/2018   PLT 175 02/17/2018   NEUTROABS 4.1 02/17/2018    CMP  Lab Results  Component Value Date   NA 139 04/04/2018   K 4.5 04/04/2018   CL 105 04/04/2018   CO2 28 04/04/2018   GLUCOSE 208 (H) 04/04/2018   BUN 11 04/04/2018   CREATININE 0.95 04/04/2018   CALCIUM 9.6 04/04/2018   PROT 7.4 04/04/2018   ALBUMIN 3.8 04/04/2018   AST 11 (L) 04/04/2018   ALT 16 04/04/2018   ALKPHOS 123 04/04/2018   BILITOT 0.6 04/04/2018   GFRNONAA >60 04/04/2018   GFRAA >60 04/04/2018    Lab Results  Component Value Date   CEA1 1.17 04/04/2018    Medications: I have reviewed the patient's current medications.   Assessment/Plan: 1. Rectal cancer, clinical stage III ? Colonoscopy 05/24/2017, mass at 12 cm from the anal verge, biopsy confirmed invasive poorly differentiated adenocarcinoma ? CTs  06/06/2017-rectal mass, perirectal adenopathy, no evidence of metastatic disease, nonspecific 1 cm sclerotic lesion at the right iliac crest ? MRI 06/06/2017, T3cN1tumor measured at 5.3 cm from the anal sphincter ? Cycle 1 FOLFOX 06/20/2017 ? Cycle 2 FOLFOX 07/04/2017 ? Cycle 3 FOLFOX 07/17/2017 ? Cycle 4 FOLFOX 08/01/2017 (Emend added secondary to prolonged nausea following chemotherapy) ? Cycle 5 FOLFOX 08/15/2017 ? Cycle 6 FOLFOX 08/29/2017 ? Cycle 7 FOLFOX 09/12/2017 ? Cycle 8 FOLFOX 09/26/2017 (dose reduced due to neuropathy and thrombocytopenia) ? Initiation of radiation and capecitabine 10/16/2017, completed 11/22/2017 ? Restaging CTs 12/10/2017- no evidence of metastatic disease, decrease in the rectal mass and near complete resolution of perirectal lymphadenopathy  ? Status post low anterior resection 01/17/2018-1 cm poorly differentiated adenocarcinoma; tumor limited to submucosa; margins negative; metastatic carcinoma in 1 of 18 lymph nodes; isolated tumor cells in 1 lymph node; treatment effect present; no loss of mismatch repair protein expression  ? CTs 07/04/2018-no evidence of recurrent disease  2. Diabetes 3. Glaucoma 4. Cecal polyp, tubular adenoma noted on the colonoscopy 05/24/2017 5. Port-A-Cath placement,Charles [name],06/17/2017 6. Oxaliplatinneuropathy    Disposition: Charles [name] is in remission from rectal cancer.  The oxaliplatin neuropathy symptoms have partially improved.  On the CEA from today.  He will return for an office visit and CEA in 4 months. We will make a referral to the pelvic physical therapy clinic.  Betsy Coder, MD  09/19/2018  9:22 AM

## 2018-09-29 ENCOUNTER — Ambulatory Visit: Payer: 59 | Attending: Oncology | Admitting: Physical Therapy

## 2018-09-29 ENCOUNTER — Encounter: Payer: Self-pay | Admitting: Physical Therapy

## 2018-09-29 ENCOUNTER — Other Ambulatory Visit: Payer: Self-pay

## 2018-09-29 DIAGNOSIS — R279 Unspecified lack of coordination: Secondary | ICD-10-CM

## 2018-09-29 DIAGNOSIS — M6281 Muscle weakness (generalized): Secondary | ICD-10-CM | POA: Insufficient documentation

## 2018-09-29 NOTE — Patient Instructions (Signed)
Bowel Control and Holding On When bowel control is decreased or lost it is helpful to understand some control techniques. Here are some tips for controlling your bowel movements.  1. Choose the best time of day to have a bowel movement:  Usually the best time of day for a bowel movement will be a half hour to an hour after breakfast.  For some people, a half hour to an hour after lunch will work better.  These times are best because the body uses the gastrocolic reflex, a stimulation of bowel motion that occurs with eating, to help produce a bowel movement.  2. Make sure that you are not rushed and have convenient access to a bathroom at your selected time.  3. Eat all your meals at a predictable time each day.  The bowel functions best when food is introduced at the same regular intervals.  4. The amount of food eaten at a given time of day should be about the same size from day to day.  The bowel functions best when food is introduced in similar quantities from day to day.  It is fine to have a small breakfast and a large lunch, or vice versa, just be consistent.  5. Eat two servings of fruit or vegetables and at least one serving of complex carbohydrates (whole grains such as brown rice, bran, whole wheat bread, or oatmeal) at each meal.   A serving of fruit or vegetables is a half-cup or medium-sized piece of fruit.  A serving of a complex carbohydrate is a half-cup or a slice of bread.  It is often desirable to eat more than the recommended minimum amounts of fruits, vegetables, and complex carbohydrates.  6. Drink plenty of water-ideally eight glasses a day.  7. Until regular bowel movements are established at a desired time of day, take 2-3 dried prunes (or  to 1/3 cup of prune juice) each night to stimulate morning bowel function.  8. Exercise daily.  You may exercise at any time of day, but you may find that bowel function is helped most if the exercise is at a consistent time each  day.   Types of Fiber  There are two main types of fiber:  insoluble and soluble.  Both of these types can prevent and relieve constipation and diarrhea, although some people find one or the other to be more easily digested.  This handout details information about both types of fiber.  Insoluble Fiber       Functions of Insoluble Fiber . moves bulk through the intestines  . controls and balances the pH (acidity) in the intestines       Benefits of Insoluble Fiber . promotes regular bowel movement and prevents constipation  . removes fecal waste through colon in less time  . keeps an optimal pH in intestines to prevent microbes from producing cancer substances, therefore preventing colon cancer        Food Sources of Insoluble Fiber . whole-wheat products  . wheat bran "miller's bran" . corn bran  . flax seed or other seeds . vegetables such as green beans, broccoli, cauliflower and potato skins  . fruit skins and root vegetable skins  . popcorn . brown rice  Soluble Fiber       Functions of Soluble Fiber  . holds water in the colon to bulk and soften the stool . prolongs stomach emptying time so that sugar is released and absorbed more slowly  Benefits of Soluble Fiber . lowers total cholesterol and LDL cholesterol (the bad cholesterol) therefore reducing the risk of heart disease  . regulates blood sugar for people with diabetes       Food Sources of Soluble Fiber . oat/oat bran . dried beans and peas  . nuts  . barley  . flax seed or other seeds . fruits such as oranges, pears, peaches, and apples  . vegetables such as carrots  . psyllium husk  . prunes  Relaxation Exercises with the Urge to Void   When you experience an urge to void:  FIRST  Stop and stand very still    Sit down if you can    Don't move    You need to stay very still to maintain control  SECOND Squeeze your pelvic floor muscles 5 times, like a quick flick, to keep from  leaking  THIRD Relax  Take a deep breath and then let it out  Try to make the urge go away by using relaxation and visualization techniques  FINALLY When you feel the urge go away somewhat, walk normally to the bathroom.   If the urge gets suddenly stronger on the way, you may stop again and relax to regain control.

## 2018-09-29 NOTE — Therapy (Signed)
[facility] [address], [place], [place], [address] Phone: [phone]   Fax:  [phone]  Physical Therapy Evaluation  Patient Details  Name: Charles [name] MRN: [id number] Date of Birth: 07-22-1965 Referring Provider (PT): Ladell Pier, MD   Encounter Date: 09/29/2018  PT End of Session - 09/29/18 1219    Visit Number  1    Date for PT Re-Evaluation  12/22/18    PT Start Time  0801    PT Stop Time  0842    PT Time Calculation (min)  41 min    Activity Tolerance  Patient tolerated treatment well       Past Medical History:  Diagnosis Date  . Diabetes mellitus without complication ([place])    Type II  . Family history of colon cancer   . Family history of prostate cancer   . Family history of stomach cancer   . Family history of uterine cancer   . History of chemotherapy LAST CHEMO NOV 2019  . Hyperlipidemia   . Neuropathy    FEET AND HANDS PAIN IN LEGS AT TIMES    Past Surgical History:  Procedure Laterality Date  . ACHILLES TENDON REPAIR Right 2005  . APPENDECTOMY    . CYSTOSCOPY WITH STENT PLACEMENT Bilateral 01/17/2018   Procedure: CYSTOSCOPY WITH STENT PLACEMENT;  Surgeon: Kathie Rhodes, MD;  Location: [facility];  Service: Urology;  Laterality: Bilateral;  . FLEXIBLE SIGMOIDOSCOPY N/A 01/17/2018   Procedure: FLEXIBLE SIGMOIDOSCOPY;  Surgeon: Ileana Roup, MD;  Location: [facility];  Service: General;  Laterality: N/A;  . HERNIA REPAIR     as a baby  . ILEOSTOMY N/A 01/17/2018   Procedure: possible diverting LOOP ILEOSTOMY;  Surgeon: Ileana Roup, MD;  Location: [facility];  Service: General;  Laterality: N/A;  . PORT-A-CATH REMOVAL Right 02/17/2018   Procedure: REMOVAL OF PORT-A-CATH;  Surgeon: Ileana Roup, MD;  Location: [facility];  Service: General;  Laterality: Right;  . PORTACATH PLACEMENT N/A 06/17/2017   Procedure: RIGHT VS LEFT INTERNAL JUGULAR PORT-A-CATH PLACEMENT WITH  ULTRASOUND;  Surgeon: Ileana Roup, MD;  [facility]: [facility];  Service: General;  Laterality: N/A;  FLURO    There were no vitals filed for this visit.   Subjective Assessment - 09/29/18 0806    Subjective  Pt has had difficulty getting everything out on the toilet and getting back to exercise.  Somtimes have to sit for an hour.  Drink about 3-6 12 oz glasses/day    Limitations  Sitting    How long can you sit comfortably?  can sit    Patient Stated Goals  get bathroom more consistent    Currently in Pain?  Yes    Pain Score  4     Pain Location  Abdomen    Pain Orientation  Mid;Lower    Pain Descriptors / Indicators  Aching    Aggravating Factors   sitting    Multiple Pain Sites  No         [facility] PT Assessment - 09/29/18 0001      Assessment   Medical Diagnosis  C20 (ICD-10-CM) - Rectal cancer [facility])    Referring Provider (PT)  Ladell Pier, MD    Prior Therapy  Yes pre cancer treatment      Precautions   Precautions  None      Restrictions   Weight Bearing Restrictions  No      Balance Screen   Has  the patient fallen in the past 6 months  No      [place] residence    Living Arrangements  Spouse/significant other      Prior Function   Level of Independence  Independent    Vocation  Full time employment    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      Cognition   Overall Cognitive Status  Within Functional Limits for tasks assessed      Observation/Other Assessments   Observations  anterior rotation of pelvis, sacral sitting when sitting down, leaning back      Posture/Postural Control   Posture/Postural Control  Postural limitations    Postural Limitations  Rounded Shoulders;Increased lumbar lordosis      ROM / Strength   AROM / PROM / Strength  Strength;PROM      PROM   Overall PROM Comments  hip IR 50%      Strength   Overall Strength Comments  hip abduction 4-/5 bilat      Flexibility    Soft Tissue Assessment /Muscle Length  yes    Hamstrings  50%      Ambulation/Gait   Gait Pattern  Within Functional Limits                Objective measurements completed on examination: See above findings.    Pelvic Floor Special Questions - 09/29/18 0001    Urinary Leakage  No    Urinary urgency  Yes    Fecal incontinence  No   hard to get full BM, spends a long time on toilet   Pelvic Floor Internal Exam  Pt identity confirmed and informed consent to perform external exam and biofeedback    Tone  high - 5 mV    Biofeedback  contractions of 15-42mV held for 2 seconds    Biofeedback sensor type  Surface       [facility] Adult PT Treatment/Exercise - 09/29/18 0001      Self-Care   Self-Care  Other Self-Care Comments    Other Self-Care Comments   educated and performed intial HEP - stretches, type of fiber education, bowel habit education             PT Education - 09/29/18 0841    Education Details  Access Code: [email or web address], urge to void and type of fiber    Person(s) Educated  Patient    Methods  Explanation;Demonstration;Handout;Verbal cues    Comprehension  Verbalized understanding;Returned demonstration       PT Short Term Goals - 09/29/18 1208      PT SHORT TERM GOAL #1   Title  Pt will report 25% less abdominal pain with sitting    Time  4    Period  Weeks    Status  New    Target Date  10/27/18      PT SHORT TERM GOAL #2   Title  ind with intital HEP    Time  4    Period  Weeks    Status  New    Target Date  10/27/18        PT Long Term Goals - 09/29/18 1015      PT LONG TERM GOAL #1   Title  Pt will report 60% less pain when sitting for work due to improved core strength and posture    Baseline  has hard time relaxing after contracting the muscles    Time  12    Period  Weeks    Status  New    Target Date  12/22/18      PT LONG TERM GOAL #2   Title  Pt will demonstrate 5/5 MMT hip abduction for improved gait with return to walking  routine    Time  12    Period  Weeks    Status  New    Target Date  12/22/18      PT LONG TERM GOAL #3   Title  pt will demonstrate at least 20 mV contraction of pelvic floor and able to hold 10 sec for 5 reps    Baseline  can hold for 2 seconds    Time  12    Period  Weeks    Status  New    Target Date  12/22/18      PT LONG TERM GOAL #4   Title  pt will demonstrate 5 times sit to stand 10 seconds or less due to improved and maintained strength with exercise program    Time  12    Period  Weeks    Status  New    Target Date  12/22/18      PT LONG TERM GOAL #5   Title  Pt will report ability to have complete bowel movement without excessive bearing down due to improved muscle strength and coordination    Time  12    Period  Weeks    Status  New    Target Date  12/22/18             Plan - 09/29/18 1210    Clinical Impression Statement  Pt presents to skilled PT due to abdominal pain and difficulty emptying bowels.  Pt has comorbidities as mentioned.  He has aquired pelvic floor weakness and decreased endurance with ability to hold a contraction for 2 seconds.  Pt has weak hip abdcution strength.  He has fascial restrictions in abdomen with weakness of abdominal muscles.  Pt demonstrates rounded spine in sitting due to feeling like it releaves his abdomen.  Pt has very tight hamstrings and decreased hip ROM.  pt will benefit from skilled PT in order to address impairments mentioned above and restore full function of his pelvic floor for greater quality of life and ability to work.    Personal Factors and Comorbidities  Comorbidity 3+    Comorbidities  hx of rectal cancer, chemo, radiation, rectal surgery    Examination-Activity Limitations  Sit;Toileting    Examination-Participation Restrictions  Community Activity    Stability/Clinical Decision Making  Evolving/Moderate complexity    Clinical Decision Making  Moderate    Rehab Potential  Excellent    PT Frequency  2x /  week    PT Duration  12 weeks    PT Treatment/Interventions  ADLs/Self Care Home Management;Biofeedback;Cryotherapy;Electrical Stimulation;Moist Heat;Neuromuscular re-education;Therapeutic activities;Therapeutic exercise;Manual techniques;Patient/family education;Dry needling;Taping;Passive range of motion    PT Next Visit Plan  biofeedback if needed, core and pelvic floor strength, 5xsit to stand, endurance    PT Home Exercise Plan  Access Code: [email or web address]    Consulted and Agree with Plan of Care  Patient       Patient will benefit from skilled therapeutic intervention in order to improve the following deficits and impairments:  Decreased activity tolerance, Decreased strength, Pain, Increased fascial restricitons, Impaired flexibility, Decreased coordination  Visit Diagnosis: Muscle weakness (generalized)  Unspecified lack of coordination     Problem List Patient Active Problem List  Diagnosis Date Noted  . Insulin-requiring or dependent type II diabetes mellitus ([place])   . Hyperlipidemia   . Neuropathy   . Genetic testing 08/27/2017  . Family history of colon cancer   . Family history of uterine cancer   . Family history of stomach cancer   . Family history of prostate cancer   . Port-A-Cath in place 06/20/2017  . Rectal cancer s/p robotic LAR rectosigmoid resection 01/17/2018 06/12/2017  . Other and unspecified hyperlipidemia 06/19/2013    Jule Ser, PT 09/29/2018, 12:27 PM  [facility] [address] [place], [place], [address] Phone: [phone]   Fax:  [phone]  Name: Charles [name] MRN: [id number] Date of Birth: 1965-08-20

## 2018-10-01 ENCOUNTER — Encounter: Payer: Self-pay | Admitting: Physical Therapy

## 2018-10-01 ENCOUNTER — Ambulatory Visit: Payer: 59 | Admitting: Physical Therapy

## 2018-10-01 ENCOUNTER — Other Ambulatory Visit: Payer: Self-pay

## 2018-10-01 DIAGNOSIS — M6281 Muscle weakness (generalized): Secondary | ICD-10-CM | POA: Diagnosis not present

## 2018-10-01 DIAGNOSIS — R279 Unspecified lack of coordination: Secondary | ICD-10-CM

## 2018-10-01 NOTE — Patient Instructions (Addendum)
Toileting Techniques for Bowel Movements (Defecation) Using your belly (abdomen) and pelvic floor muscles to have a bowel movement is usually instinctive.  Sometimes people can have problems with these muscles and have to relearn proper defecation (emptying) techniques.  If you have weakness in your muscles, organs that are falling out, decreased sensation in your pelvis, or ignore your urge to go, you may find yourself straining to have a bowel movement.  You are straining if you are: . holding your breath or taking in a huge gulp of air and holding it  . keeping your lips and jaw tensed and closed tightly . turning red in the face because of excessive pushing or forcing . developing or worsening your  hemorrhoids . getting faint while pushing . not emptying completely and have to defecate many times a day  If you are straining, you are actually making it harder for yourself to have a bowel movement.  Many people find they are pulling up with the pelvic floor muscles and closing off instead of opening the anus. Due to lack pelvic floor relaxation and coordination the abdominal muscles, one has to work harder to push the feces out.  Many people have never been taught how to defecate efficiently and effectively.  Notice what happens to your body when you are having a bowel movement.  While you are sitting on the toilet pay attention to the following areas: . Jaw and mouth position . Angle of your hips   . Whether your feet touch the ground or not . Arm placement  . Spine position . Waist . Belly tension . Anus (opening of the anal canal)  An Evacuation/Defecation Plan   Here are the 4 basic points:  1. Lean forward enough for your elbows to rest on your knees 2. Support your feet on the floor or use a low stool if your feet don't touch the floor  3. Push out your belly as if you have swallowed a beach ball-you should feel a widening of your waist 4. Open and relax your pelvic floor muscles,  rather than tightening around the anus      The following conditions my require modifications to your toileting posture:  . If you have had surgery in the past that limits your back, hip, pelvic, knee or ankle flexibility . Constipation   Your healthcare practitioner may make the following additional suggestions and adjustments:  1) Sit on the toilet  a) Make sure your feet are supported. b) Notice your hip angle and spine position-most people find it effective to lean forward or raise their knees, which can help the muscles around the anus to relax  c) When you lean forward, place your forearms on your thighs for support  2) Relax suggestions a) Breath deeply in through your nose and out slowly through your mouth as if you are smelling the flowers and blowing out the candles. b) To become aware of how to relax your muscles, contracting and releasing muscles can be helpful.  Pull your pelvic floor muscles in tightly by using the image of holding back gas, or closing around the anus (visualize making a circle smaller) and lifting the anus up and in.  Then release the muscles and your anus should drop down and feel open. Repeat 5 times ending with the feeling of relaxation. c) Keep your pelvic floor muscles relaxed; let your belly bulge out. d) The digestive tract starts at the mouth and ends at the anal opening, so be   sure to relax both ends of the tube.  Place your tongue on the roof of your mouth with your teeth separated.  This helps relax your mouth and will help to relax the anus at the same time.  3) Empty (defecation) a) Keep your pelvic floor and sphincter relaxed, then bulge your anal muscles.  Make the anal opening wide.  b) Stick your belly out as if you have swallowed a beach ball. c) Make your belly wall hard using your belly muscles while continuing to breathe. Doing this makes it easier to open your anus. d) Breath out and give a grunt (or try using other sounds such as  ahhhh, shhhhh, ohhhh or grrrrrrr).  4) Finish a) As you finish your bowel movement, pull the pelvic floor muscles up and in.  This will leave your anus in the proper place rather than remaining pushed out and down. If you leave your anus pushed out and down, it will start to feel as though that is normal and give you incorrect signals about needing to have a bowel movement.    About Abdominal Massage  Abdominal massage, also called external colon massage, is a self-treatment circular massage technique that can reduce and eliminate gas and ease constipation. The colon naturally contracts in waves in a clockwise direction starting from inside the right hip, moving up toward the ribs, across the belly, and down inside the left hip.  When you perform circular abdominal massage, you help stimulate your colon's normal wave pattern of movement called peristalsis.  It is most beneficial when done after eating.  Positioning You can practice abdominal massage with oil while lying down, or in the shower with soap.  Some people find that it is just as effective to do the massage through clothing while sitting or standing.  How to Massage Start by placing your finger tips or knuckles on your right side, just inside your hip bone.  . Make small circular movements while you move upward toward your rib cage.   . Once you reach the bottom right side of your rib cage, take your circular movements across to the left side of the bottom of your rib cage.  . Next, move downward until you reach the inside of your left hip bone.  This is the path your feces travel in your colon. . Continue to perform your abdominal massage in this pattern for 10 minutes each day.     You can apply as much pressure as is comfortable in your massage.  Start gently and build pressure as you continue to practice.  Notice any areas of pain as you massage; areas of slight pain may be relieved as you massage, but if you have areas of  significant or intense pain, consult with your healthcare provider.  Other Considerations . General physical activity including bending and stretching can have a beneficial massage-like effect on the colon.  Deep breathing can also stimulate the colon because breathing deeply activates the same nervous system that supplies the colon.   . Abdominal massage should always be used in combination with a bowel-conscious diet that is high in the proper type of fiber for you, fluids (primarily water), and a regular exercise program.  [facility] [address] [place], [place] [address]

## 2018-10-01 NOTE — Therapy (Signed)
[facility] [address], [place], [place], [address] Phone: [phone]   Fax:  [phone]  Physical Therapy Treatment  Patient Details  Name: Charles [name] MRN: [id number] Date of Birth: 1965/04/16 Referring Provider (PT): Ladell Pier, MD   Encounter Date: 10/01/2018  PT End of Session - 10/01/18 1359    Visit Number  2    Date for PT Re-Evaluation  12/22/18    PT Start Time  1359    PT Stop Time  1440    PT Time Calculation (min)  41 min    Activity Tolerance  Patient tolerated treatment well    Behavior During Therapy  [facility] for tasks assessed/performed       Past Medical History:  Diagnosis Date  . Diabetes mellitus without complication ([place])    Type II  . Family history of colon cancer   . Family history of prostate cancer   . Family history of stomach cancer   . Family history of uterine cancer   . History of chemotherapy LAST CHEMO NOV 2019  . Hyperlipidemia   . Neuropathy    FEET AND HANDS PAIN IN LEGS AT TIMES    Past Surgical History:  Procedure Laterality Date  . ACHILLES TENDON REPAIR Right 2005  . APPENDECTOMY    . CYSTOSCOPY WITH STENT PLACEMENT Bilateral 01/17/2018   Procedure: CYSTOSCOPY WITH STENT PLACEMENT;  Surgeon: Kathie Rhodes, MD;  [facility];  Service: Urology;  Laterality: Bilateral;  . FLEXIBLE SIGMOIDOSCOPY N/A 01/17/2018   Procedure: FLEXIBLE SIGMOIDOSCOPY;  Surgeon: Ileana Roup, MD;  [facility]: [facility];  Service: General;  Laterality: N/A;  . HERNIA REPAIR     as a baby  . ILEOSTOMY N/A 01/17/2018   Procedure: possible diverting LOOP ILEOSTOMY;  Surgeon: Ileana Roup, MD;  [facility];  Service: General;  Laterality: N/A;  . PORT-A-CATH REMOVAL Right 02/17/2018   Procedure: REMOVAL OF PORT-A-CATH;  Surgeon: Ileana Roup, MD;  [facility];  Service: General;  Laterality: Right;  . PORTACATH PLACEMENT N/A 06/17/2017   Procedure: RIGHT  VS LEFT INTERNAL JUGULAR PORT-A-CATH PLACEMENT WITH ULTRASOUND;  Surgeon: Ileana Roup, MD;  [facility];  Service: General;  Laterality: N/A;  FLURO    There were no vitals filed for this visit.  Subjective Assessment - 10/01/18 1413    Subjective  I got the app on my phone, I haven't started doing the exercises yet.  I am not having as much pain today as the other day 3/10.    Patient Stated Goals  get bathroom more consistent    Currently in Pain?  Yes    Pain Score  3     Pain Location  Abdomen    Pain Orientation  Lower    Pain Descriptors / Indicators  Aching    Multiple Pain Sites  No                       [facility] Adult PT Treatment/Exercise - 10/01/18 0001      Self-Care   Other Self-Care Comments   toileting techniques      Exercises   Exercises  Lumbar      Lumbar Exercises: Stretches   Active Hamstring Stretch  3 reps;30 seconds    Single Knee to Chest Stretch  3 reps;30 seconds    Lower Trunk Rotation  3 reps;10 seconds    Pelvic Tilt  10 reps  towel under sacrum for increased stretch   Piriformis Stretch  3 reps;30 seconds      Manual Therapy   Manual Therapy  Myofascial release    Myofascial Release  abdominal myofascial release               PT Short Term Goals - 09/29/18 1208      PT SHORT TERM GOAL #1   Title  Pt will report 25% less abdominal pain with sitting    Time  4    Period  Weeks    Status  New    Target Date  10/27/18      PT SHORT TERM GOAL #2   Title  ind with intital HEP    Time  4    Period  Weeks    Status  New    Target Date  10/27/18        PT Long Term Goals - 09/29/18 1015      PT LONG TERM GOAL #1   Title  Pt will report 60% less pain when sitting for work due to improved core strength and posture    Baseline  has hard time relaxing after contracting the muscles    Time  12    Period  Weeks    Status  New    Target Date  12/22/18      PT LONG TERM GOAL #2   Title  Pt will  demonstrate 5/5 MMT hip abduction for improved gait with return to walking routine    Time  12    Period  Weeks    Status  New    Target Date  12/22/18      PT LONG TERM GOAL #3   Title  pt will demonstrate at least 20 mV contraction of pelvic floor and able to hold 10 sec for 5 reps    Baseline  can hold for 2 seconds    Time  12    Period  Weeks    Status  New    Target Date  12/22/18      PT LONG TERM GOAL #4   Title  pt will demonstrate 5 times sit to stand 10 seconds or less due to improved and maintained strength with exercise program    Time  12    Period  Weeks    Status  New    Target Date  12/22/18      PT LONG TERM GOAL #5   Title  Pt will report ability to have complete bowel movement without excessive bearing down due to improved muscle strength and coordination    Time  12    Period  Weeks    Status  New    Target Date  12/22/18            Plan - 10/01/18 1722    Clinical Impression Statement  Pt with initial treatment after eval. Responded well to myofascial with releases felt throughout abdomen.  Pt reviewed HEP with directions on focusing on breathing and stretching.  Pt will benefit from skilled PT to porgress with POC at this time.    PT Treatment/Interventions  ADLs/Self Care Home Management;Biofeedback;Cryotherapy;Electrical Stimulation;Moist Heat;Neuromuscular re-education;Therapeutic activities;Therapeutic exercise;Manual techniques;Patient/family education;Dry needling;Taping;Passive range of motion    PT Next Visit Plan  core strength, lumbar STM and stretches, 5xsi tto stand, f/u on HEP    PT Home Exercise Plan  Access Code: [email or web address]    Consulted and Agree with Plan of Care  Patient       Patient will benefit from skilled therapeutic intervention in order to improve the following deficits and impairments:  Decreased activity tolerance, Decreased strength, Pain, Increased fascial restricitons, Impaired flexibility, Decreased coordination  Visit  Diagnosis: Muscle weakness (generalized)  Unspecified lack of coordination     Problem List Patient Active Problem List   Diagnosis Date Noted  . Insulin-requiring or dependent type II diabetes mellitus ([place])   . Hyperlipidemia   . Neuropathy   . Genetic testing 08/27/2017  . Family history of colon cancer   . Family history of uterine cancer   . Family history of stomach cancer   . Family history of prostate cancer   . Port-A-Cath in place 06/20/2017  . Rectal cancer s/p robotic LAR rectosigmoid resection 01/17/2018 06/12/2017  . Other and unspecified hyperlipidemia 06/19/2013    Jule Ser, PT 10/01/2018, 5:26 PM  [facility] [address], [place], [place], [address] Phone: [phone]   Fax:  [phone]  Name: Charles [name] MRN: [id number] Date of Birth: 1965-12-31

## 2018-10-06 ENCOUNTER — Other Ambulatory Visit: Payer: Self-pay

## 2018-10-06 ENCOUNTER — Encounter: Payer: Self-pay | Admitting: Physical Therapy

## 2018-10-06 ENCOUNTER — Ambulatory Visit: Payer: 59 | Admitting: Physical Therapy

## 2018-10-06 DIAGNOSIS — M6281 Muscle weakness (generalized): Secondary | ICD-10-CM | POA: Diagnosis not present

## 2018-10-06 DIAGNOSIS — R279 Unspecified lack of coordination: Secondary | ICD-10-CM

## 2018-10-06 NOTE — Therapy (Signed)
[facility] [address] [place], [place], [address] Phone: [phone]   Fax:  [phone]  Physical Therapy Treatment  Patient Details  Name: Charles [name] MRN: [id number] Date of Birth: 15-Feb-1965 Referring Provider (PT): Ladell Pier, MD   Encounter Date: 10/06/2018  PT End of Session - 10/06/18 0804    Visit Number  3    Date for PT Re-Evaluation  12/22/18    PT Start Time  0801    PT Stop Time  0845    PT Time Calculation (min)  44 min    Activity Tolerance  Patient tolerated treatment well    Behavior During Therapy  [facility] for tasks assessed/performed       Past Medical History:  Diagnosis Date  . Diabetes mellitus without complication ([place])    Type II  . Family history of colon cancer   . Family history of prostate cancer   . Family history of stomach cancer   . Family history of uterine cancer   . History of chemotherapy LAST CHEMO NOV 2019  . Hyperlipidemia   . Neuropathy    FEET AND HANDS PAIN IN LEGS AT TIMES    Past Surgical History:  Procedure Laterality Date  . ACHILLES TENDON REPAIR Right 2005  . APPENDECTOMY    . CYSTOSCOPY WITH STENT PLACEMENT Bilateral 01/17/2018   Procedure: CYSTOSCOPY WITH STENT PLACEMENT;  Surgeon: Kathie Rhodes, MD;  [facility]: [facility];  Service: Urology;  Laterality: Bilateral;  . FLEXIBLE SIGMOIDOSCOPY N/A 01/17/2018   Procedure: FLEXIBLE SIGMOIDOSCOPY;  Surgeon: Ileana Roup, MD;  [facility];  Service: General;  Laterality: N/A;  . HERNIA REPAIR     as a baby  . ILEOSTOMY N/A 01/17/2018   Procedure: possible diverting LOOP ILEOSTOMY;  Surgeon: Ileana Roup, MD;  [facility];  Service: General;  Laterality: N/A;  . PORT-A-CATH REMOVAL Right 02/17/2018   Procedure: REMOVAL OF PORT-A-CATH;  Surgeon: Ileana Roup, MD;  [facility];  Service: General;  Laterality: Right;  . PORTACATH PLACEMENT N/A 06/17/2017   Procedure: RIGHT  VS LEFT INTERNAL JUGULAR PORT-A-CATH PLACEMENT WITH ULTRASOUND;  Surgeon: Ileana Roup, MD;  [facility];  Service: General;  Laterality: N/A;  FLURO    There were no vitals filed for this visit.  Subjective Assessment - 10/06/18 0807    Subjective  I have been doing the exercises, but not the one where you push your low back down into the mat, that one is still hard.  The abdominal massage felt good but I haven't tried it at home yet. Denies pain currently.    Patient Stated Goals  get bathroom more consistent    Currently in Pain?  No/denies                       [facility] Adult PT Treatment/Exercise - 10/06/18 0001      Lumbar Exercises: Stretches   Active Hamstring Stretch  3 reps;30 seconds   standing   Hip Flexor Stretch  Right;Left;30 seconds   standing   Piriformis Stretch  3 reps;10 seconds;Right;Left   supine   Gastroc Stretch  Right;Left;30 seconds   slant board     Lumbar Exercises: Aerobic   Nustep  seat/UE 11; L1 x 8 min   PT present for status update     Lumbar Exercises: Supine   AB Set Limitations  ball squeeze with core bracing - 3 sec x 15  Clam  20 reps;3 seconds    Clam Limitations  yellow    Other Supine Lumbar Exercises  horizontal abduction yellow band - 20x      Manual Therapy   Manual Therapy  Soft tissue mobilization    Soft tissue mobilization  lumbar and T8-12 paraspinals             PT Education - 10/06/18 0850    Education Details  Access Code: [email or web address]    Person(s) Educated  Patient    Methods  Explanation;Demonstration;Verbal cues;Handout    Comprehension  Verbalized understanding;Returned demonstration       PT Short Term Goals - 09/29/18 1208      PT SHORT TERM GOAL #1   Title  Pt will report 25% less abdominal pain with sitting    Time  4    Period  Weeks    Status  New    Target Date  10/27/18      PT SHORT TERM GOAL #2   Title  ind with intital HEP    Time  4    Period  Weeks    Status   New    Target Date  10/27/18        PT Long Term Goals - 09/29/18 1015      PT LONG TERM GOAL #1   Title  Pt will report 60% less pain when sitting for work due to improved core strength and posture    Baseline  has hard time relaxing after contracting the muscles    Time  12    Period  Weeks    Status  New    Target Date  12/22/18      PT LONG TERM GOAL #2   Title  Pt will demonstrate 5/5 MMT hip abduction for improved gait with return to walking routine    Time  12    Period  Weeks    Status  New    Target Date  12/22/18      PT LONG TERM GOAL #3   Title  pt will demonstrate at least 20 mV contraction of pelvic floor and able to hold 10 sec for 5 reps    Baseline  can hold for 2 seconds    Time  12    Period  Weeks    Status  New    Target Date  12/22/18      PT LONG TERM GOAL #4   Title  pt will demonstrate 5 times sit to stand 10 seconds or less due to improved and maintained strength with exercise program    Time  12    Period  Weeks    Status  New    Target Date  12/22/18      PT LONG TERM GOAL #5   Title  Pt will report ability to have complete bowel movement without excessive bearing down due to improved muscle strength and coordination    Time  12    Period  Weeks    Status  New    Target Date  12/22/18            Plan - 10/06/18 0850    Clinical Impression Statement  Pt is ind with initial HEP and is able to complete every morning.  Pt responded well from soft tissue has most of his tension in lower thoracic spine.  He needed cues to brace core without bulging abdomen or holding his breath.  Pt was fatigued from  basic core strengthening exercises but able to do them correctly and add to HEP.  Pt will benefit from skilled PT to continue to slowly progress core and pelvic floor strength and endurance    PT Treatment/Interventions  ADLs/Self Care Home Management;Biofeedback;Cryotherapy;Electrical Stimulation;Moist Heat;Neuromuscular  re-education;Therapeutic activities;Therapeutic exercise;Manual techniques;Patient/family education;Dry needling;Taping;Passive range of motion    PT Next Visit Plan  nustep warm up, core strength, lumbar STM and stretches, 5x sitto stand, f/u on new exercises added last time    PT Home Exercise Plan  Access Code: [email or web address]    Recommended Other Services  order signed    Consulted and Agree with Plan of Care  Patient       Patient will benefit from skilled therapeutic intervention in order to improve the following deficits and impairments:     Visit Diagnosis: Muscle weakness (generalized)  Unspecified lack of coordination     Problem List Patient Active Problem List   Diagnosis Date Noted  . Insulin-requiring or dependent type II diabetes mellitus ([place])   . Hyperlipidemia   . Neuropathy   . Genetic testing 08/27/2017  . Family history of colon cancer   . Family history of uterine cancer   . Family history of stomach cancer   . Family history of prostate cancer   . Port-A-Cath in place 06/20/2017  . Rectal cancer s/p robotic LAR rectosigmoid resection 01/17/2018 06/12/2017  . Other and unspecified hyperlipidemia 06/19/2013    Jule Ser ,PT 10/06/2018, 8:55 AM  [facility] [address] [place], [place], [address] Phone: [phone]   Fax:  [phone]  Name: Lemanuel Fukuda MRN: [id number] Date of Birth: [birth year]

## 2018-10-06 NOTE — Patient Instructions (Signed)
Access Code: [email or web address]  URL: [email or web address]  Date: 10/06/2018  Prepared by: Jari Favre   Exercises  Supine Diaphragmatic Breathing - 10 reps - 1 sets - 3x daily - 7x weekly  Supine Single Knee to Chest Stretch - 5 reps - 1 sets - 10 sec hold - 2x daily - 7x weekly  Supine Piriformis Stretch - 3 reps - 1 sets - 30 sec hold - 1x daily - 7x weekly  Seated Hamstring Stretch - 3 reps - 1 sets - 30 sec hold - 1x daily - 7x weekly  Seated Piriformis Stretch with Trunk Bend - 3 reps - 1 sets - 30 sec hold - 2x daily - 7x weekly  Bent Knee Fallouts - 10 reps - 1 sets - 2x daily - 7x weekly  Hooklying Clamshell with Resistance - 10 reps - 1 sets - 3 sec hold - 2x daily - 7x weekly  Supine Transversus Abdominis Bracing with Heel Slide - 10 reps - 2 sets - 1x daily - 7x weekly  Bridge - 10 reps - 1 sets - 3 sec hold - 2x daily - 7x weekly  Standing Shoulder Row with Anchored Resistance - 10 reps - 2 sets - 2x daily - 7x weekly  Shoulder Extension with Resistance - Neutral - 10 reps - 2 sets - 2x daily - 7x weekly  Walking Tandem Stance - 10 reps - 3 sets - 1x daily - 7x weekly  Alternating Single Leg Balance - Foot Behind - 10 reps - 3 sets - 1x daily - 7x weekly  Single Leg Balance with Opposite Leg Star Reach - 10 reps - 3 sets - 1x daily - 7x weekly  Supine ITB Stretch with Strap - 10 reps - 3 sets - 1x daily - 7x weekly  Sit to Stand with Pelvic Floor Contraction - 10 reps - 3 sets - 1x daily - 7x weekly  Swiss Ball Knee Extension - 10 reps - 3 sets - 1x daily - 7x weekly  Forward T - 10 reps - 3 sets - 1x daily - 7x weekly

## 2018-10-10 ENCOUNTER — Other Ambulatory Visit: Payer: Self-pay

## 2018-10-10 ENCOUNTER — Encounter: Payer: Self-pay | Admitting: Physical Therapy

## 2018-10-10 ENCOUNTER — Ambulatory Visit: Payer: 59 | Attending: Oncology | Admitting: Physical Therapy

## 2018-10-10 DIAGNOSIS — M6281 Muscle weakness (generalized): Secondary | ICD-10-CM | POA: Diagnosis not present

## 2018-10-10 DIAGNOSIS — R279 Unspecified lack of coordination: Secondary | ICD-10-CM | POA: Diagnosis present

## 2018-10-10 NOTE — Therapy (Signed)
[facility] [address], [place], [place], [address] Phone: [phone]   Fax:  [phone]  Physical Therapy Treatment  Patient Details  Name: Charles [name] MRN: [id number] Date of Birth: 12-Mar-1965 Referring Provider (PT): Ladell Pier, MD   Encounter Date: 10/10/2018  PT End of Session - 10/10/18 0953    Visit Number  4    Date for PT Re-Evaluation  12/22/18    PT Start Time  0932    PT Stop Time  1012    PT Time Calculation (min)  40 min    Activity Tolerance  Patient tolerated treatment well    Behavior During Therapy  [facility] for tasks assessed/performed       Past Medical History:  Diagnosis Date  . Diabetes mellitus without complication ([place])    Type II  . Family history of colon cancer   . Family history of prostate cancer   . Family history of stomach cancer   . Family history of uterine cancer   . History of chemotherapy LAST CHEMO NOV 2019  . Hyperlipidemia   . Neuropathy    FEET AND HANDS PAIN IN LEGS AT TIMES    Past Surgical History:  Procedure Laterality Date  . ACHILLES TENDON REPAIR Right 2005  . APPENDECTOMY    . CYSTOSCOPY WITH STENT PLACEMENT Bilateral 01/17/2018   Procedure: CYSTOSCOPY WITH STENT PLACEMENT;  Surgeon: Kathie Rhodes, MD;  [facility];  Service: Urology;  Laterality: Bilateral;  . FLEXIBLE SIGMOIDOSCOPY N/A 01/17/2018   Procedure: FLEXIBLE SIGMOIDOSCOPY;  Surgeon: Ileana Roup, MD;  [facility];  Service: General;  Laterality: N/A;  . HERNIA REPAIR     as a baby  . ILEOSTOMY N/A 01/17/2018   Procedure: possible diverting LOOP ILEOSTOMY;  Surgeon: Ileana Roup, MD;  [facility];  Service: General;  Laterality: N/A;  . PORT-A-CATH REMOVAL Right 02/17/2018   Procedure: REMOVAL OF PORT-A-CATH;  Surgeon: Ileana Roup, MD;  [facility];  Service: General;  Laterality: Right;  . PORTACATH PLACEMENT N/A 06/17/2017   Procedure: RIGHT  VS LEFT INTERNAL JUGULAR PORT-A-CATH PLACEMENT WITH ULTRASOUND;  Surgeon: Ileana Roup, MD;  [facility];  Service: General;  Laterality: N/A;  FLURO    There were no vitals filed for this visit.  Subjective Assessment - 10/10/18 0936    Subjective  Pt reports he has had some pain near his Rt shoulder blade that has been worsening.    Patient Stated Goals  get bathroom more consistent    Currently in Pain?  Yes    Pain Score  5     Pain Location  Shoulder    Pain Orientation  Right    Pain Descriptors / Indicators  Aching    Pain Type  Acute pain    Pain Radiating Towards  by the inside of the shoulder blade    Pain Onset  More than a month ago    Pain Frequency  Intermittent    Aggravating Factors   computer    Multiple Pain Sites  No                       [facility] Adult PT Treatment/Exercise - 10/10/18 0001      Lumbar Exercises: Supine   Other Supine Lumbar Exercises  horizontal abduction yellow band - 20x    Other Supine Lumbar Exercises  shoulder ER yellow band - 20  Shoulder Exercises: Supine   Other Supine Exercises  supine thoracic extension with towel roll and UE flex - 10x 5 sec    Other Supine Exercises  open book stretch towel roll lengthways - 10x 5 sec      Manual Therapy   Soft tissue mobilization  Rhomboids, low trap, thoracic paraspinals; edu in self massage with tennis ball against wall               PT Short Term Goals - 10/10/18 1027      PT SHORT TERM GOAL #1   Title  Pt will report 25% less abdominal pain with sitting    Status  On-going      PT SHORT TERM GOAL #2   Title  ind with intital HEP    Status  On-going        PT Long Term Goals - 09/29/18 1015      PT LONG TERM GOAL #1   Title  Pt will report 60% less pain when sitting for work due to improved core strength and posture    Baseline  has hard time relaxing after contracting the muscles    Time  12    Period  Weeks    Status  New    Target  Date  12/22/18      PT LONG TERM GOAL #2   Title  Pt will demonstrate 5/5 MMT hip abduction for improved gait with return to walking routine    Time  12    Period  Weeks    Status  New    Target Date  12/22/18      PT LONG TERM GOAL #3   Title  pt will demonstrate at least 20 mV contraction of pelvic floor and able to hold 10 sec for 5 reps    Baseline  can hold for 2 seconds    Time  12    Period  Weeks    Status  New    Target Date  12/22/18      PT LONG TERM GOAL #4   Title  pt will demonstrate 5 times sit to stand 10 seconds or less due to improved and maintained strength with exercise program    Time  12    Period  Weeks    Status  New    Target Date  12/22/18      PT LONG TERM GOAL #5   Title  Pt will report ability to have complete bowel movement without excessive bearing down due to improved muscle strength and coordination    Time  12    Period  Weeks    Status  New    Target Date  12/22/18            Plan - 10/10/18 1018    Clinical Impression Statement  Today's session focused more on patient's posture and wokring on improved thoracic extension.  Pt has muscle spasm in left low traps and Rt rhomboids that loosened with soft tissue.  He felt a good pec stretch with stretches provided in today's sesion and will add to HEP.  Pt will continue to benefit from skilled PT to progress according to POC and reach functional goals.    Comorbidities  hx of rectal cancer, chemo, radiation, rectal surgery    PT Treatment/Interventions  ADLs/Self Care Home Management;Biofeedback;Cryotherapy;Electrical Stimulation;Moist Heat;Neuromuscular re-education;Therapeutic activities;Therapeutic exercise;Manual techniques;Patient/family education;Dry needling;Taping;Passive range of motion    PT Next Visit Plan  nustep warm up, core  strength, lumbar STM and stretches, 5x sitto stand, f/u on new exercises added last time    PT Home Exercise Plan  Access Code: [email or web address]    Consulted and  Agree with Plan of Care  Patient       Patient will benefit from skilled therapeutic intervention in order to improve the following deficits and impairments:  Decreased activity tolerance, Decreased strength, Pain, Increased fascial restricitons, Impaired flexibility, Decreased coordination  Visit Diagnosis: Muscle weakness (generalized)  Unspecified lack of coordination     Problem List Patient Active Problem List   Diagnosis Date Noted  . Insulin-requiring or dependent type II diabetes mellitus ([place])   . Hyperlipidemia   . Neuropathy   . Genetic testing 08/27/2017  . Family history of colon cancer   . Family history of uterine cancer   . Family history of stomach cancer   . Family history of prostate cancer   . Port-A-Cath in place 06/20/2017  . Rectal cancer s/p robotic LAR rectosigmoid resection 01/17/2018 06/12/2017  . Other and unspecified hyperlipidemia 06/19/2013    Jule Ser, PT 10/10/2018, 10:28 AM  [facility] [address], [place], [place], [address] Phone: [phone]   Fax:  [phone]  Name: Charles [name] MRN: [id number] Date of Birth: 12-12-1965

## 2018-10-13 ENCOUNTER — Other Ambulatory Visit: Payer: Self-pay

## 2018-10-13 ENCOUNTER — Ambulatory Visit: Payer: 59 | Admitting: Physical Therapy

## 2018-10-13 ENCOUNTER — Encounter: Payer: Self-pay | Admitting: Physical Therapy

## 2018-10-13 DIAGNOSIS — R279 Unspecified lack of coordination: Secondary | ICD-10-CM

## 2018-10-13 DIAGNOSIS — M6281 Muscle weakness (generalized): Secondary | ICD-10-CM

## 2018-10-13 NOTE — Therapy (Signed)
[facility] [address], [place], [place], [address] Phone: [phone]   Fax:  [phone]  Physical Therapy Treatment  Patient Details  Name: Charles [name] MRN: [id number] Date of Birth: Feb 15, 1965 Referring Provider (PT): Ladell Pier, MD   Encounter Date: 10/13/2018  PT End of Session - 10/13/18 0814    Visit Number  5    Date for PT Re-Evaluation  12/22/18    PT Start Time  0802    PT Stop Time  0842    PT Time Calculation (min)  40 min    Activity Tolerance  Patient tolerated treatment well    Behavior During Therapy  [facility] for tasks assessed/performed       Past Medical History:  Diagnosis Date  . Diabetes mellitus without complication ([place])    Type II  . Family history of colon cancer   . Family history of prostate cancer   . Family history of stomach cancer   . Family history of uterine cancer   . History of chemotherapy LAST CHEMO NOV 2019  . Hyperlipidemia   . Neuropathy    FEET AND HANDS PAIN IN LEGS AT TIMES    Past Surgical History:  Procedure Laterality Date  . ACHILLES TENDON REPAIR Right 2005  . APPENDECTOMY    . CYSTOSCOPY WITH STENT PLACEMENT Bilateral 01/17/2018   Procedure: CYSTOSCOPY WITH STENT PLACEMENT;  Surgeon: Kathie Rhodes, MD;  [facility];  Service: Urology;  Laterality: Bilateral;  . FLEXIBLE SIGMOIDOSCOPY N/A 01/17/2018   Procedure: FLEXIBLE SIGMOIDOSCOPY;  Surgeon: Ileana Roup, MD;  [facility];  Service: General;  Laterality: N/A;  . HERNIA REPAIR     as a baby  . ILEOSTOMY N/A 01/17/2018   Procedure: possible diverting LOOP ILEOSTOMY;  Surgeon: Ileana Roup, MD;  [facility];  Service: General;  Laterality: N/A;  . PORT-A-CATH REMOVAL Right 02/17/2018   Procedure: REMOVAL OF PORT-A-CATH;  Surgeon: Ileana Roup, MD;  [facility];  Service: General;  Laterality: Right;  . PORTACATH PLACEMENT N/A 06/17/2017   Procedure: RIGHT  VS LEFT INTERNAL JUGULAR PORT-A-CATH PLACEMENT WITH ULTRASOUND;  Surgeon: Ileana Roup, MD;  [facility];  Service: General;  Laterality: N/A;  FLURO    There were no vitals filed for this visit.  Subjective Assessment - 10/13/18 0805    Subjective  As soon as I sat down this morning my shoulder was hurting again so I think it is the way I'm sitting.    Currently in Pain?  Yes    Pain Score  5     Pain Location  Shoulder    Pain Orientation  Right    Pain Descriptors / Indicators  Aching    Pain Type  Acute pain    Pain Onset  More than a month ago    Aggravating Factors   computer work    Multiple Pain Sites  No                       [facility] Adult PT Treatment/Exercise - 10/13/18 0001      Lumbar Exercises: Stretches   Other Lumbar Stretch Exercise  upper trap stretch - 10 sec x 3 bilat      Lumbar Exercises: Aerobic   Stationary Bike  L2 x 8 min PT present for status update      Lumbar Exercises: Standing   Row  Strengthening;Right;Left;Theraband;20 reps    Theraband  Level (Row)  Level 3 (Green)    Shoulder Extension  Strengthening;20 reps    Theraband Level (Shoulder Extension)  Level 2 (Red)      Lumbar Exercises: Supine   Other Supine Lumbar Exercises  ball squeeze with breathing and pelvic floor and core engaged - 20x 3 sec hold      Shoulder Exercises: Supine   Other Supine Exercises  seated thoracic extension and flexion with rotation; upper trap stretch - 10x 10 sec each      Manual Therapy   Soft tissue mobilization  Rhomboids, low trap, thoracic paraspinals, upper traps             PT Education - 10/13/18 0846    Education Details  Access Code: [email or web address]    Person(s) Educated  Patient    Methods  Explanation;Demonstration;Handout;Verbal cues    Comprehension  Verbalized understanding;Returned demonstration       PT Short Term Goals - 10/13/18 0820      PT SHORT TERM GOAL #1   Title  Pt will report 25% less abdominal pain  with sitting    Baseline  45-50% better    Status  Achieved      PT SHORT TERM GOAL #2   Title  ind with intital HEP    Baseline  initial HEP - stretches doing every day    Status  Achieved        PT Long Term Goals - 09/29/18 1015      PT LONG TERM GOAL #1   Title  Pt will report 60% less pain when sitting for work due to improved core strength and posture    Baseline  has hard time relaxing after contracting the muscles    Time  12    Period  Weeks    Status  New    Target Date  12/22/18      PT LONG TERM GOAL #2   Title  Pt will demonstrate 5/5 MMT hip abduction for improved gait with return to walking routine    Time  12    Period  Weeks    Status  New    Target Date  12/22/18      PT LONG TERM GOAL #3   Title  pt will demonstrate at least 20 mV contraction of pelvic floor and able to hold 10 sec for 5 reps    Baseline  can hold for 2 seconds    Time  12    Period  Weeks    Status  New    Target Date  12/22/18      PT LONG TERM GOAL #4   Title  pt will demonstrate 5 times sit to stand 10 seconds or less due to improved and maintained strength with exercise program    Time  12    Period  Weeks    Status  New    Target Date  12/22/18      PT LONG TERM GOAL #5   Title  Pt will report ability to have complete bowel movement without excessive bearing down due to improved muscle strength and coordination    Time  12    Period  Weeks    Status  New    Target Date  12/22/18            Plan - 10/13/18 0846    Clinical Impression Statement  Pt was able to do exercises today without increased pain.  Pt needed cues to  keep shoulders down and back. He has shortening of upper traps and muscle spasms throughout.  Pt was agreeable to goal of adding one stretch during the day to his exercises this week.  Pt was educated on the importance of adding exercise throughout the day to progress his strength and endurnace.  Pt reports he is feeling better overall and met two  short term goals today.    PT Treatment/Interventions  ADLs/Self Care Home Management;Biofeedback;Cryotherapy;Electrical Stimulation;Moist Heat;Neuromuscular re-education;Therapeutic activities;Therapeutic exercise;Manual techniques;Patient/family education;Dry needling;Taping;Passive range of motion    PT Next Visit Plan  nustep warm up, core strength, thoracic extension stretches, sit to stand, f/u on stretches and follow through on exercises added last time    PT Home Exercise Plan  Access Code: [email or web address]    Consulted and Agree with Plan of Care  Patient       Patient will benefit from skilled therapeutic intervention in order to improve the following deficits and impairments:  Decreased activity tolerance, Decreased strength, Pain, Increased fascial restricitons, Impaired flexibility, Decreased coordination  Visit Diagnosis: Muscle weakness (generalized)  Unspecified lack of coordination     Problem List Patient Active Problem List   Diagnosis Date Noted  . Insulin-requiring or dependent type II diabetes mellitus ([place])   . Hyperlipidemia   . Neuropathy   . Genetic testing 08/27/2017  . Family history of colon cancer   . Family history of uterine cancer   . Family history of stomach cancer   . Family history of prostate cancer   . Port-A-Cath in place 06/20/2017  . Rectal cancer s/p robotic LAR rectosigmoid resection 01/17/2018 06/12/2017  . Other and unspecified hyperlipidemia 06/19/2013    Jule Ser, PT 10/13/2018, 9:33 AM  [facility] [address] [place], [place], [address] Phone: [phone]   Fax:  [phone]  Name: Charles [name] MRN: [id number] Date of Birth: [birth year]

## 2018-10-13 NOTE — Patient Instructions (Signed)
Access Code: [email or web address]  URL: [email or web address]  Date: 10/13/2018  Prepared by: Jari Favre   Exercises  Supine Diaphragmatic Breathing - 10 reps - 1 sets - 3x daily - 7x weekly  Supine Single Knee to Chest Stretch - 5 reps - 1 sets - 10 sec hold - 2x daily - 7x weekly  Supine Piriformis Stretch - 3 reps - 1 sets - 30 sec hold - 1x daily - 7x weekly  Seated Hamstring Stretch - 3 reps - 1 sets - 30 sec hold - 1x daily - 7x weekly  Seated Piriformis Stretch with Trunk Bend - 3 reps - 1 sets - 30 sec hold - 2x daily - 7x weekly  Bent Knee Fallouts - 10 reps - 1 sets - 2x daily - 7x weekly  Hooklying Clamshell with Resistance - 10 reps - 1 sets - 3 sec hold - 2x daily - 7x weekly  Supine Transversus Abdominis Bracing with Heel Slide - 10 reps - 2 sets - 1x daily - 7x weekly  Bridge - 10 reps - 1 sets - 3 sec hold - 2x daily - 7x weekly  Standing Shoulder Row with Anchored Resistance - 10 reps - 2 sets - 2x daily - 7x weekly  Shoulder Extension with Resistance - Neutral - 10 reps - 2 sets - 2x daily - 7x weekly  Alternating Single Leg Balance - Foot Behind - 10 reps - 3 sets - 1x daily - 7x weekly  Single Leg Balance with Opposite Leg Star Reach - 10 reps - 3 sets - 1x daily - 7x weekly  Supine ITB Stretch with Strap - 10 reps - 3 sets - 1x daily - 7x weekly  Open Book Chest Stretch on Towel Roll - 10 reps - 1 sets - 5 sec hold - 1x daily - 7x weekly  Sit to Stand with Pelvic Floor Contraction - 10 reps - 3 sets - 1x daily - 7x weekly  Thoracic Extension Mobilization with Noodle - 10 reps - 1 sets - 5 se hold - 1x daily - 7x weekly  Seated Thoracic Flexion and Rotation with Arms Crossed - 10 reps - 1 sets - 5 sec hold - 1x daily - 7x weekly  Seated Thoracic Lumbar Extension - 10 reps - 1 sets - 5 sec hold - 1x daily - 7x weekly  Seated Cervical Sidebending Stretch - 6 reps - 1 sets - 10 sec hold - 1x daily - 7x weekly

## 2018-10-17 ENCOUNTER — Encounter: Payer: Self-pay | Admitting: Physical Therapy

## 2018-10-17 ENCOUNTER — Ambulatory Visit: Payer: 59 | Admitting: Physical Therapy

## 2018-10-17 ENCOUNTER — Other Ambulatory Visit: Payer: Self-pay

## 2018-10-17 DIAGNOSIS — M6281 Muscle weakness (generalized): Secondary | ICD-10-CM | POA: Diagnosis not present

## 2018-10-17 DIAGNOSIS — R279 Unspecified lack of coordination: Secondary | ICD-10-CM

## 2018-10-17 NOTE — Patient Instructions (Signed)
Access Code: [email or web address]  URL: [email or web address]  Date: 10/17/2018  Prepared by: Jari Favre   Exercises  Supine Diaphragmatic Breathing - 10 reps - 1 sets - 3x daily - 7x weekly  Supine Single Knee to Chest Stretch - 5 reps - 1 sets - 10 sec hold - 2x daily - 7x weekly  Supine Piriformis Stretch - 3 reps - 1 sets - 30 sec hold - 1x daily - 7x weekly  Seated Hamstring Stretch - 3 reps - 1 sets - 30 sec hold - 1x daily - 7x weekly  Seated Piriformis Stretch with Trunk Bend - 3 reps - 1 sets - 30 sec hold - 2x daily - 7x weekly  Bent Knee Fallouts - 10 reps - 1 sets - 2x daily - 7x weekly  Hooklying Clamshell with Resistance - 10 reps - 1 sets - 3 sec hold - 2x daily - 7x weekly  Supine Transversus Abdominis Bracing with Heel Slide - 10 reps - 2 sets - 1x daily - 7x weekly  Bridge - 10 reps - 1 sets - 3 sec hold - 2x daily - 7x weekly  Standing Shoulder Row with Anchored Resistance - 10 reps - 2 sets - 2x daily - 7x weekly  Shoulder Extension with Resistance - Neutral - 10 reps - 2 sets - 2x daily - 7x weekly  Alternating Single Leg Balance - Foot Behind - 10 reps - 3 sets - 1x daily - 7x weekly  Single Leg Balance with Opposite Leg Star Reach - 10 reps - 3 sets - 1x daily - 7x weekly  Supine ITB Stretch with Strap - 10 reps - 3 sets - 1x daily - 7x weekly  Open Book Chest Stretch on Towel Roll - 10 reps - 1 sets - 5 sec hold - 1x daily - 7x weekly  Sit to Stand with Pelvic Floor Contraction - 10 reps - 3 sets - 1x daily - 7x weekly  Thoracic Extension Mobilization with Noodle - 10 reps - 1 sets - 5 se hold - 1x daily - 7x weekly  Seated Thoracic Flexion and Rotation with Arms Crossed - 10 reps - 1 sets - 5 sec hold - 1x daily - 7x weekly  Seated Thoracic Lumbar Extension - 10 reps - 1 sets - 5 sec hold - 1x daily - 7x weekly  Seated Cervical Sidebending Stretch - 6 reps - 1 sets - 10 sec hold - 1x daily - 7x weekly  Standing Isometric Cervical Retraction with Chin  Tucks and Ball at [employer] - 10 reps - 1 sets - 5 sec hold - 1x daily - 7x weekly

## 2018-10-17 NOTE — Therapy (Signed)
[facility] [address], [place], [place], [address] Phone: [phone]   Fax:  [phone]  Physical Therapy Treatment  Patient Details  Name: Charles [name] MRN: [id number] Date of Birth: [birth year] Referring Provider (PT): Ladell Pier, MD   Encounter Date: 10/17/2018  PT End of Session - 10/17/18 0942    Visit Number  6    Date for PT Re-Evaluation  12/22/18    PT Start Time  0940    PT Stop Time  1018    PT Time Calculation (min)  38 min    Activity Tolerance  Patient tolerated treatment well    Behavior During Therapy  [facility] for tasks assessed/performed       Past Medical History:  Diagnosis Date  . Diabetes mellitus without complication ([place])    Type II  . Family history of colon cancer   . Family history of prostate cancer   . Family history of stomach cancer   . Family history of uterine cancer   . History of chemotherapy LAST CHEMO NOV 2019  . Hyperlipidemia   . Neuropathy    FEET AND HANDS PAIN IN LEGS AT TIMES    Past Surgical History:  Procedure Laterality Date  . ACHILLES TENDON REPAIR Right 2005  . APPENDECTOMY    . CYSTOSCOPY WITH STENT PLACEMENT Bilateral 01/17/2018   Procedure: CYSTOSCOPY WITH STENT PLACEMENT;  Surgeon: Kathie Rhodes, MD;  Location: [facility];  Service: Urology;  Laterality: Bilateral;  . FLEXIBLE SIGMOIDOSCOPY N/A 01/17/2018   Procedure: FLEXIBLE SIGMOIDOSCOPY;  Surgeon: Ileana Roup, MD;  Location: [facility];  Service: General;  Laterality: N/A;  . HERNIA REPAIR     as a baby  . ILEOSTOMY N/A 01/17/2018   Procedure: possible diverting LOOP ILEOSTOMY;  Surgeon: Ileana Roup, MD;  Location: [facility];  Service: General;  Laterality: N/A;  . PORT-A-CATH REMOVAL Right 02/17/2018   Procedure: REMOVAL OF PORT-A-CATH;  Surgeon: Ileana Roup, MD;  Location: [facility];  Service: General;  Laterality: Right;  . PORTACATH PLACEMENT N/A 06/17/2017   Procedure: RIGHT  VS LEFT INTERNAL JUGULAR PORT-A-CATH PLACEMENT WITH ULTRASOUND;  Surgeon: Ileana Roup, MD;  Location: [facility];  Service: General;  Laterality: N/A;  FLURO    There were no vitals filed for this visit.  Subjective Assessment - 10/17/18 0944    Subjective  Feeling good today.  Shoulder is a little stiff.    Patient Stated Goals  get bathroom more consistent    Currently in Pain?  No/denies                       [facility] Adult PT Treatment/Exercise - 10/17/18 0001      Self-Care   Self-Care  Posture    Other Self-Care Comments   standing with foam roll behind at the wall - press into cervical retraction, cues for looking straight ahead and shoulders down Lt more elevated than Rt; bilateral ER yellow 20x each      Lumbar Exercises: Aerobic   Stationary Bike  L3 x 8 min PT present for status update      Lumbar Exercises: Standing   Row  Strengthening;Power tower;Both;10 reps   3 sets ; 25#   Shoulder Extension  Strengthening;Power Tower;Both;10 reps   3 sets; 20#     Manual Therapy   Soft tissue mobilization  Rhomboids, low trap, thoracic paraspinals, upper traps, cervical traction scalenes and suboccipitals  PT Education - 10/17/18 1156    Education Details  Access Code: [email or web address]    Person(s) Educated  Patient    Methods  Explanation;Demonstration;Handout;Verbal cues    Comprehension  Verbalized understanding;Returned demonstration       PT Short Term Goals - 10/13/18 0820      PT SHORT TERM GOAL #1   Title  Pt will report 25% less abdominal pain with sitting    Baseline  45-50% better    Status  Achieved      PT SHORT TERM GOAL #2   Title  ind with intital HEP    Baseline  initial HEP - stretches doing every day    Status  Achieved        PT Long Term Goals - 10/17/18 0946      PT LONG TERM GOAL #1   Title  Pt will report 60% less pain when sitting for work due to improved core strength and posture    Baseline  this week was  not too bad, feels like improvement    Status  On-going            Plan - 10/17/18 1151    Clinical Impression Statement  Today's session  emphasized posture.  He needed cues for shoulder and neck position during exercises.  pt demonstrated improved posture after soft tissue mobs as detailed above.  He was fatigued with exercises at the wall due to decreased core and posutral strength.  He will continue to benefit from skilled PT to work on improved strength and coordination for maximum function.    PT Treatment/Interventions  ADLs/Self Care Home Management;Biofeedback;Cryotherapy;Electrical Stimulation;Moist Heat;Neuromuscular re-education;Therapeutic activities;Therapeutic exercise;Manual techniques;Patient/family education;Dry needling;Taping;Passive range of motion    PT Next Visit Plan  core strength, cervical retractions and posture strength    PT Home Exercise Plan  Access Code: [email or web address]    Consulted and Agree with Plan of Care  Patient       Patient will benefit from skilled therapeutic intervention in order to improve the following deficits and impairments:  Decreased activity tolerance, Decreased strength, Pain, Increased fascial restricitons, Impaired flexibility, Decreased coordination  Visit Diagnosis: Muscle weakness (generalized)  Unspecified lack of coordination     Problem List Patient Active Problem List   Diagnosis Date Noted  . Insulin-requiring or dependent type II diabetes mellitus ([place])   . Hyperlipidemia   . Neuropathy   . Genetic testing 08/27/2017  . Family history of colon cancer   . Family history of uterine cancer   . Family history of stomach cancer   . Family history of prostate cancer   . Port-A-Cath in place 06/20/2017  . Rectal cancer s/p robotic LAR rectosigmoid resection 01/17/2018 06/12/2017  . Other and unspecified hyperlipidemia 06/19/2013    Jule Ser, PT 10/17/2018, 11:58 AM  [facility] [address] [place], [place], [address] Phone: [phone]   Fax:  [phone]  Name: Charles [name] MRN: [id number] Date of Birth: [birth year]

## 2018-10-20 ENCOUNTER — Encounter: Payer: Self-pay | Admitting: Physical Therapy

## 2018-10-20 ENCOUNTER — Other Ambulatory Visit: Payer: Self-pay

## 2018-10-20 ENCOUNTER — Ambulatory Visit: Payer: 59 | Admitting: Physical Therapy

## 2018-10-20 DIAGNOSIS — M6281 Muscle weakness (generalized): Secondary | ICD-10-CM | POA: Diagnosis not present

## 2018-10-20 DIAGNOSIS — R279 Unspecified lack of coordination: Secondary | ICD-10-CM

## 2018-10-20 NOTE — Therapy (Signed)
[facility] [address], [place], [place], [address] Phone: [phone]   Fax:  [phone]  Physical Therapy Treatment  Patient Details  Name: Charles [name] MRN: [id number] Date of Birth: 1966/01/01 Referring Provider (PT): Ladell Pier, MD   Encounter Date: 10/20/2018  PT End of Session - 10/20/18 0935    Visit Number  7    Date for PT Re-Evaluation  12/22/18    PT Start Time  0931    PT Stop Time  1010    PT Time Calculation (min)  39 min    Activity Tolerance  Patient tolerated treatment well    Behavior During Therapy  [facility] for tasks assessed/performed       Past Medical History:  Diagnosis Date  . Diabetes mellitus without complication ([place])    Type II  . Family history of colon cancer   . Family history of prostate cancer   . Family history of stomach cancer   . Family history of uterine cancer   . History of chemotherapy LAST CHEMO NOV 2019  . Hyperlipidemia   . Neuropathy    FEET AND HANDS PAIN IN LEGS AT TIMES    Past Surgical History:  Procedure Laterality Date  . ACHILLES TENDON REPAIR Right 2005  . APPENDECTOMY    . CYSTOSCOPY WITH STENT PLACEMENT Bilateral 01/17/2018   Procedure: CYSTOSCOPY WITH STENT PLACEMENT;  Surgeon: Kathie Rhodes, MD;  [facility]: [facility];  Service: Urology;  Laterality: Bilateral;  . FLEXIBLE SIGMOIDOSCOPY N/A 01/17/2018   Procedure: FLEXIBLE SIGMOIDOSCOPY;  Surgeon: Ileana Roup, MD;  [facility];  Service: General;  Laterality: N/A;  . HERNIA REPAIR     as a baby  . ILEOSTOMY N/A 01/17/2018   Procedure: possible diverting LOOP ILEOSTOMY;  Surgeon: Ileana Roup, MD;  [facility];  Service: General;  Laterality: N/A;  . PORT-A-CATH REMOVAL Right 02/17/2018   Procedure: REMOVAL OF PORT-A-CATH;  Surgeon: Ileana Roup, MD;  [facility];  Service: General;  Laterality: Right;  . PORTACATH PLACEMENT N/A 06/17/2017   Procedure: RIGHT  VS LEFT INTERNAL JUGULAR PORT-A-CATH PLACEMENT WITH ULTRASOUND;  Surgeon: Ileana Roup, MD;  [facility];  Service: General;  Laterality: N/A;  FLURO    There were no vitals filed for this visit.  Subjective Assessment - 10/20/18 0933    Subjective  I'm not too bad.  The shoulder a little bit.  I did my stretches Saturday.    Patient Stated Goals  get bathroom more consistent                       [facility] Adult PT Treatment/Exercise - 10/20/18 0001      Lumbar Exercises: Aerobic   Nustep  seat/UE 10; L3 x 8 min   PT present for status update     Lumbar Exercises: Machines for Strengthening   Leg Press  seat 8; bilat 70# - 3x10   cue to engage core and pelvic floor     Lumbar Exercises: Standing   Row  Strengthening;Power tower;Both;10 reps   3 sets ; 25#   Shoulder Extension  Strengthening;Power Tower;Both;10 reps   3 sets; 20#     Lumbar Exercises: Seated   Sit to Stand  10 reps   eccentric sitting     Lumbar Exercises: Supine   Clam  20 reps;3 seconds    Clam Limitations  green    Bridge with [employer]  10 reps;5 seconds   kegel and core bracing   Straight Leg Raise  15 reps   bilat     Knee/Hip Exercises: Standing   Forward Step Up  Right;Left;Step Height: 6";20 reps;Hand Hold: 0   kegel with step up     Shoulder Exercises: Supine   Other Supine Exercises  cervical retraction - 10 x 5sec             PT Education - 10/20/18 1011    Education Details  updated medbridge    Person(s) Educated  Patient    Methods  Explanation;Demonstration;Tactile cues;Verbal cues;Handout    Comprehension  Verbalized understanding;Returned demonstration       PT Short Term Goals - 10/13/18 0820      PT SHORT TERM GOAL #1   Title  Pt will report 25% less abdominal pain with sitting    Baseline  45-50% better    Status  Achieved      PT SHORT TERM GOAL #2   Title  ind with intital HEP    Baseline  initial HEP - stretches doing every day     Status  Achieved        PT Long Term Goals - 10/20/18 8676      PT LONG TERM GOAL #1   Title  Pt will report 60% less pain when sitting for work due to improved core strength and posture    Baseline  40-50% better    Status  Partially Met      PT LONG TERM GOAL #2   Title  Pt will demonstrate 5/5 MMT hip abduction for improved gait with return to walking routine    Status  On-going      PT LONG TERM GOAL #3   Title  pt will demonstrate at least 20 mV contraction of pelvic floor and able to hold 10 sec for 5 reps    Status  On-going      PT LONG TERM GOAL #4   Title  pt will demonstrate 5 times sit to stand 10 seconds or less due to improved and maintained strength with exercise program    Status  On-going      PT LONG TERM GOAL #5   Title  Pt will report ability to have complete bowel movement without excessive bearing down due to improved muscle strength and coordination    Status  On-going            Plan - 10/20/18 1012    Clinical Impression Statement  Pt tolerated exercises well.  He was able to advance the exericses today and do more strengthening.  He maintained energy level throughout the session today  Pt is still not doing many exercises at home but is consistent with stretches and kegels in supine.  Pt will benefit from skilled PT to work on improved endurance and strength.    PT Treatment/Interventions  ADLs/Self Care Home Management;Biofeedback;Cryotherapy;Electrical Stimulation;Moist Heat;Neuromuscular re-education;Therapeutic activities;Therapeutic exercise;Manual techniques;Patient/family education;Dry needling;Taping;Passive range of motion    PT Next Visit Plan  core strength, advance core and pelvic floor as tolerated, posture and posture strength    PT Home Exercise Plan  Access Code: [email or web address]    Consulted and Agree with Plan of Care  Patient       Patient will benefit from skilled therapeutic intervention in order to improve the following deficits  and impairments:  Decreased activity tolerance, Decreased strength, Pain, Increased fascial restricitons, Impaired flexibility, Decreased coordination  Visit Diagnosis: Muscle  weakness (generalized)  Unspecified lack of coordination     Problem List Patient Active Problem List   Diagnosis Date Noted  . Insulin-requiring or dependent type II diabetes mellitus ([place])   . Hyperlipidemia   . Neuropathy   . Genetic testing 08/27/2017  . Family history of colon cancer   . Family history of uterine cancer   . Family history of stomach cancer   . Family history of prostate cancer   . Port-A-Cath in place 06/20/2017  . Rectal cancer s/p robotic LAR rectosigmoid resection 01/17/2018 06/12/2017  . Other and unspecified hyperlipidemia 06/19/2013    Jule Ser, PT 10/20/2018, 10:15 AM  [facility] [address] [place], [place], [address] Phone: [phone]   Fax:  [phone]  Name: Keaden Gunnoe MRN: [id number] Date of Birth: 1965/05/02

## 2018-10-20 NOTE — Patient Instructions (Signed)
Access Code: [email or web address]  URL: [email or web address]  Date: 10/20/2018  Prepared by: Jari Favre   Exercises  Supine Diaphragmatic Breathing - 10 reps - 1 sets - 3x daily - 7x weekly  Supine Single Knee to Chest Stretch - 5 reps - 1 sets - 10 sec hold - 2x daily - 7x weekly  Supine Piriformis Stretch - 3 reps - 1 sets - 30 sec hold - 1x daily - 7x weekly  Seated Hamstring Stretch - 3 reps - 1 sets - 30 sec hold - 1x daily - 7x weekly  Seated Piriformis Stretch with Trunk Bend - 3 reps - 1 sets - 30 sec hold - 2x daily - 7x weekly  Bent Knee Fallouts - 10 reps - 1 sets - 2x daily - 7x weekly  Hooklying Clamshell with Resistance - 10 reps - 1 sets - 5 sec hold - 2x daily - 7x weekly  Bridge - 10 reps - 1 sets - 5 sec hold - 2x daily - 7x weekly  Standing Shoulder Row with Anchored Resistance - 10 reps - 2 sets - 2x daily - 7x weekly  Shoulder Extension with Resistance - Neutral - 10 reps - 2 sets - 2x daily - 7x weekly  Supine ITB Stretch with Strap - 10 reps - 3 sets - 1x daily - 7x weekly  Open Book Chest Stretch on Towel Roll - 10 reps - 1 sets - 5 sec hold - 1x daily - 7x weekly  Sit to Stand with Pelvic Floor Contraction - 10 reps - 3 sets - 1x daily - 7x weekly  Thoracic Extension Mobilization with Noodle - 10 reps - 1 sets - 5 se hold - 1x daily - 7x weekly  Seated Thoracic Flexion and Rotation with Arms Crossed - 10 reps - 1 sets - 5 sec hold - 1x daily - 7x weekly  Seated Thoracic Lumbar Extension - 10 reps - 1 sets - 5 sec hold - 1x daily - 7x weekly  Seated Cervical Sidebending Stretch - 6 reps - 1 sets - 10 sec hold - 1x daily - 7x weekly  Standing Isometric Cervical Retraction with Chin Tucks and Ball at [employer] - 10 reps - 1 sets - 5 sec hold - 1x daily - 7x weekly

## 2018-10-24 ENCOUNTER — Encounter: Payer: Self-pay | Admitting: Physical Therapy

## 2018-10-24 ENCOUNTER — Other Ambulatory Visit: Payer: Self-pay

## 2018-10-24 ENCOUNTER — Ambulatory Visit: Payer: 59 | Admitting: Physical Therapy

## 2018-10-24 DIAGNOSIS — M6281 Muscle weakness (generalized): Secondary | ICD-10-CM

## 2018-10-24 DIAGNOSIS — R279 Unspecified lack of coordination: Secondary | ICD-10-CM

## 2018-10-24 NOTE — Therapy (Signed)
[facility] [address] [place], [place], [address] Phone: [phone]   Fax:  [phone]  Physical Therapy Treatment  Patient Details  Name: Charles [name] MRN: [id number] Date of Birth: 02-25-1965 Referring Provider (PT): Ladell Pier, MD   Encounter Date: 10/24/2018  PT End of Session - 10/24/18 0808    Visit Number  8    Date for PT Re-Evaluation  12/22/18    PT Start Time  0800    PT Stop Time  0840    PT Time Calculation (min)  40 min    Activity Tolerance  Patient tolerated treatment well    Behavior During Therapy  [facility] for tasks assessed/performed       Past Medical History:  Diagnosis Date  . Diabetes mellitus without complication ([place])    Type II  . Family history of colon cancer   . Family history of prostate cancer   . Family history of stomach cancer   . Family history of uterine cancer   . History of chemotherapy LAST CHEMO NOV 2019  . Hyperlipidemia   . Neuropathy    FEET AND HANDS PAIN IN LEGS AT TIMES    Past Surgical History:  Procedure Laterality Date  . ACHILLES TENDON REPAIR Right 2005  . APPENDECTOMY    . CYSTOSCOPY WITH STENT PLACEMENT Bilateral 01/17/2018   Procedure: CYSTOSCOPY WITH STENT PLACEMENT;  Surgeon: Kathie Rhodes, MD;  Location: [facility];  Service: Urology;  Laterality: Bilateral;  . FLEXIBLE SIGMOIDOSCOPY N/A 01/17/2018   Procedure: FLEXIBLE SIGMOIDOSCOPY;  Surgeon: Ileana Roup, MD;  Location: [facility];  Service: General;  Laterality: N/A;  . HERNIA REPAIR     as a baby  . ILEOSTOMY N/A 01/17/2018   Procedure: possible diverting LOOP ILEOSTOMY;  Surgeon: Ileana Roup, MD;  [facility]: [facility];  Service: General;  Laterality: N/A;  . PORT-A-CATH REMOVAL Right 02/17/2018   Procedure: REMOVAL OF PORT-A-CATH;  Surgeon: Ileana Roup, MD;  [facility]: [facility];  Service: General;  Laterality: Right;  . PORTACATH PLACEMENT N/A 06/17/2017   Procedure: RIGHT  VS LEFT INTERNAL JUGULAR PORT-A-CATH PLACEMENT WITH ULTRASOUND;  Surgeon: Ileana Roup, MD;  [facility];  Service: General;  Laterality: N/A;  FLURO    There were no vitals filed for this visit.  Subjective Assessment - 10/24/18 0813    Subjective  I was a little sore after last time, but not bad.    Patient Stated Goals  get bathroom more consistent    Currently in Pain?  No/denies                       [facility] Adult PT Treatment/Exercise - 10/24/18 0001      Neuro Re-ed    Neuro Re-ed Details   foam mat and using wall to educate on proper squat technique      Lumbar Exercises: Stretches   Active Hamstring Stretch  3 reps;30 seconds   standing     Lumbar Exercises: Aerobic   Nustep  seat/UE 10; L3 x 8 min   PT present for status update     Lumbar Exercises: Machines for Strengthening   Leg Press  seat 8; bilat 80# - 3x10   cue to engage core and pelvic floor     Lumbar Exercises: Standing   Functional Squats  20 reps;5 reps   eccentric pelvic floor contraction; 5 x 5 sec   Other Standing Lumbar Exercises  red ball rolling up the wall straight and side bend - 10x 5 sec hold      Knee/Hip Exercises: Standing   Lateral Step Up  Right;Left;10 reps;Hand Hold: 0    Forward Step Up  Right;Left;Step Height: 6";20 reps;Hand Hold: 0   kegel with step up   Other Standing Knee Exercises  standing on foam mat - UE flexion with yellow weighted ball; chopping with yellow weighted ball; heel rocking forward and back - 2x10               PT Short Term Goals - 10/13/18 0820      PT SHORT TERM GOAL #1   Title  Pt will report 25% less abdominal pain with sitting    Baseline  45-50% better    Status  Achieved      PT SHORT TERM GOAL #2   Title  ind with intital HEP    Baseline  initial HEP - stretches doing every day    Status  Achieved        PT Long Term Goals - 10/20/18 9833      PT LONG TERM GOAL #1   Title  Pt will report 60% less pain  when sitting for work due to improved core strength and posture    Baseline  40-50% better    Status  Partially Met      PT LONG TERM GOAL #2   Title  Pt will demonstrate 5/5 MMT hip abduction for improved gait with return to walking routine    Status  On-going      PT LONG TERM GOAL #3   Title  pt will demonstrate at least 20 mV contraction of pelvic floor and able to hold 10 sec for 5 reps    Status  On-going      PT LONG TERM GOAL #4   Title  pt will demonstrate 5 times sit to stand 10 seconds or less due to improved and maintained strength with exercise program    Status  On-going      PT LONG TERM GOAL #5   Title  Pt will report ability to have complete bowel movement without excessive bearing down due to improved muscle strength and coordination    Status  On-going            Plan - 10/24/18 0840    Clinical Impression Statement  Pt did well with exercises today.  He needed minimal cues to engage his abdominal muscles.  He needed moderate cues to keep his knees behind his toes during squats.  Pt was able to progress strength with resistance and added difficutly.  He will continue to benefit from skilled PT according to POC.    PT Treatment/Interventions  ADLs/Self Care Home Management;Biofeedback;Cryotherapy;Electrical Stimulation;Moist Heat;Neuromuscular re-education;Therapeutic activities;Therapeutic exercise;Manual techniques;Patient/family education;Dry needling;Taping;Passive range of motion    PT Next Visit Plan  core strength, advance core and pelvic floor as tolerated, posture and posture strength, progress squats and balance    PT Home Exercise Plan  Access Code: [email or web address]    Consulted and Agree with Plan of Care  Patient       Patient will benefit from skilled therapeutic intervention in order to improve the following deficits and impairments:  Decreased activity tolerance, Decreased strength, Pain, Increased fascial restricitons, Impaired flexibility, Decreased  coordination  Visit Diagnosis: Muscle weakness (generalized)  Unspecified lack of coordination     Problem List Patient Active Problem List   Diagnosis Date Noted  .  Insulin-requiring or dependent type II diabetes mellitus ([place])   . Hyperlipidemia   . Neuropathy   . Genetic testing 08/27/2017  . Family history of colon cancer   . Family history of uterine cancer   . Family history of stomach cancer   . Family history of prostate cancer   . Port-A-Cath in place 06/20/2017  . Rectal cancer s/p robotic LAR rectosigmoid resection 01/17/2018 06/12/2017  . Other and unspecified hyperlipidemia 06/19/2013    Jule Ser, PT 10/24/2018, 8:43 AM  [facility] [address] [place], [place], [address] Phone: [phone]   Fax:  [phone]  Name: Kye Hedden MRN: [id number] Date of Birth: 1965-10-04

## 2018-10-27 ENCOUNTER — Encounter: Payer: Self-pay | Admitting: Physical Therapy

## 2018-10-27 ENCOUNTER — Ambulatory Visit: Payer: 59 | Admitting: Physical Therapy

## 2018-10-27 ENCOUNTER — Other Ambulatory Visit: Payer: Self-pay

## 2018-10-27 DIAGNOSIS — R279 Unspecified lack of coordination: Secondary | ICD-10-CM

## 2018-10-27 DIAGNOSIS — M6281 Muscle weakness (generalized): Secondary | ICD-10-CM | POA: Diagnosis not present

## 2018-10-27 NOTE — Therapy (Signed)
[facility] [address], [place], [place], [address] Phone: [phone]   Fax:  [phone]  Physical Therapy Treatment  Patient Details  Name: Charles [name] MRN: [id number] Date of Birth: 02-Feb-1965 Referring Provider (PT): Ladell Pier, MD   Encounter Date: 10/27/2018  PT End of Session - 10/27/18 0806    Visit Number  9    Date for PT Re-Evaluation  12/22/18    PT Start Time  0801    PT Stop Time  0841    PT Time Calculation (min)  40 min    Activity Tolerance  Patient tolerated treatment well    Behavior During Therapy  [facility] for tasks assessed/performed       Past Medical History:  Diagnosis Date  . Diabetes mellitus without complication ([place])    Type II  . Family history of colon cancer   . Family history of prostate cancer   . Family history of stomach cancer   . Family history of uterine cancer   . History of chemotherapy LAST CHEMO NOV 2019  . Hyperlipidemia   . Neuropathy    FEET AND HANDS PAIN IN LEGS AT TIMES    Past Surgical History:  Procedure Laterality Date  . ACHILLES TENDON REPAIR Right 2005  . APPENDECTOMY    . CYSTOSCOPY WITH STENT PLACEMENT Bilateral 01/17/2018   Procedure: CYSTOSCOPY WITH STENT PLACEMENT;  Surgeon: Kathie Rhodes, MD;  [facility];  Service: Urology;  Laterality: Bilateral;  . FLEXIBLE SIGMOIDOSCOPY N/A 01/17/2018   Procedure: FLEXIBLE SIGMOIDOSCOPY;  Surgeon: Ileana Roup, MD;  [facility];  Service: General;  Laterality: N/A;  . HERNIA REPAIR     as a baby  . ILEOSTOMY N/A 01/17/2018   Procedure: possible diverting LOOP ILEOSTOMY;  Surgeon: Ileana Roup, MD;  [facility];  Service: General;  Laterality: N/A;  . PORT-A-CATH REMOVAL Right 02/17/2018   Procedure: REMOVAL OF PORT-A-CATH;  Surgeon: Ileana Roup, MD;  [facility];  Service: General;  Laterality: Right;  . PORTACATH PLACEMENT N/A 06/17/2017   Procedure: RIGHT  VS LEFT INTERNAL JUGULAR PORT-A-CATH PLACEMENT WITH ULTRASOUND;  Surgeon: Ileana Roup, MD;  [facility];  Service: General;  Laterality: N/A;  FLURO    There were no vitals filed for this visit.  Subjective Assessment - 10/27/18 0809    Subjective  I am stiff today because I was lazy over the weekend.  I was sore on Saturday after Friday's workout. Pt denies pain just general stiffness.    Patient Stated Goals  get bathroom more consistent    Currently in Pain?  No/denies                       [facility] Adult PT Treatment/Exercise - 10/27/18 0001      Lumbar Exercises: Stretches   Active Hamstring Stretch  3 reps;30 seconds   standing     Lumbar Exercises: Aerobic   Nustep  seat/UE 10; L3 x 8 min   PT present for status update     Lumbar Exercises: Machines for Strengthening   Leg Press  seat 8; bilat 80# - 3x10   cue to engage core and pelvic floor     Lumbar Exercises: Standing   Functional Squats  10 reps   eccentric pelvic floor contraction; 10 x 5 sec   Row  Strengthening;Power tower;Both;10 reps   3 sets ; 25#   Shoulder Extension  Strengthening;Power  Tower;Both;10 reps   3 sets; 20#     Lumbar Exercises: Supine   Bent Knee Raise  20 reps    Bent Knee Raise Limitations  yellow band    Bridge  20 reps    Bridge Limitations  yellow band isometric abduction      Lumbar Exercises: Sidelying   Clam  Both;20 reps    Clam Limitations  yellow      Knee/Hip Exercises: Standing   Lateral Step Up  Right;Left;Hand Hold: 0;15 reps    Forward Step Up  Right;Left;Step Height: 6";Hand Hold: 0;15 reps   kegel with step up     Shoulder Exercises: Supine   Horizontal ABduction  Strengthening;Both;20 reps;Theraband    Theraband Level (Shoulder Horizontal ABduction)  Level 2 (Red)    Diagonals  Strengthening;Both;20 reps;Theraband    Theraband Level (Shoulder Diagonals)  Level 2 (Red)    Other Supine Exercises  cervical retraction - 10 x 5sec       Manual Therapy   Manual Therapy  Manual Traction    Manual therapy comments  cervical traction with some soft tissue release to Rt suboccipitals and paraspinals               PT Short Term Goals - 10/13/18 0820      PT SHORT TERM GOAL #1   Title  Pt will report 25% less abdominal pain with sitting    Baseline  45-50% better    Status  Achieved      PT SHORT TERM GOAL #2   Title  ind with intital HEP    Baseline  initial HEP - stretches doing every day    Status  Achieved        PT Long Term Goals - 10/20/18 5974      PT LONG TERM GOAL #1   Title  Pt will report 60% less pain when sitting for work due to improved core strength and posture    Baseline  40-50% better    Status  Partially Met      PT LONG TERM GOAL #2   Title  Pt will demonstrate 5/5 MMT hip abduction for improved gait with return to walking routine    Status  On-going      PT LONG TERM GOAL #3   Title  pt will demonstrate at least 20 mV contraction of pelvic floor and able to hold 10 sec for 5 reps    Status  On-going      PT LONG TERM GOAL #4   Title  pt will demonstrate 5 times sit to stand 10 seconds or less due to improved and maintained strength with exercise program    Status  On-going      PT LONG TERM GOAL #5   Title  Pt will report ability to have complete bowel movement without excessive bearing down due to improved muscle strength and coordination    Status  On-going            Plan - 10/27/18 0840    Clinical Impression Statement  Pt was monitored for posture and pain throughout.  No increased pain and reports overall feeling better.  Pt is gradually increasing resistance.  Today's session focused on posture strengthening as well as progressing LE and core strength.  Atempt to reduce shoulder pain with cervical traction today and will follow up next session.    PT Treatment/Interventions  ADLs/Self Care Home Management;Biofeedback;Cryotherapy;Electrical Stimulation;Moist  Heat;Neuromuscular re-education;Therapeutic activities;Therapeutic exercise;Manual techniques;Patient/family education;Dry  needling;Taping;Passive range of motion    PT Next Visit Plan  f/u on shoulder pain, core strength, advance core and pelvic floor as tolerated, posture and posture strength, progress squats and balance    PT Home Exercise Plan  Access Code: [email or web address]    Consulted and Agree with Plan of Care  Patient       Patient will benefit from skilled therapeutic intervention in order to improve the following deficits and impairments:  Decreased activity tolerance, Decreased strength, Pain, Increased fascial restricitons, Impaired flexibility, Decreased coordination  Visit Diagnosis: Muscle weakness (generalized)  Unspecified lack of coordination     Problem List Patient Active Problem List   Diagnosis Date Noted  . Insulin-requiring or dependent type II diabetes mellitus ([place])   . Hyperlipidemia   . Neuropathy   . Genetic testing 08/27/2017  . Family history of colon cancer   . Family history of uterine cancer   . Family history of stomach cancer   . Family history of prostate cancer   . Port-A-Cath in place 06/20/2017  . Rectal cancer s/p robotic LAR rectosigmoid resection 01/17/2018 06/12/2017  . Other and unspecified hyperlipidemia 06/19/2013    Jule Ser, PT 10/27/2018, 8:43 AM  [facility] [address], [place], [place], [address] Phone: [phone]   Fax:  [phone]  Name: Charles [name] MRN: [id number] Date of Birth: 08-16-1965

## 2018-10-30 ENCOUNTER — Encounter: Payer: Self-pay | Admitting: Physical Therapy

## 2018-10-30 ENCOUNTER — Ambulatory Visit: Payer: 59 | Admitting: Physical Therapy

## 2018-10-30 ENCOUNTER — Other Ambulatory Visit: Payer: Self-pay

## 2018-10-30 DIAGNOSIS — M6281 Muscle weakness (generalized): Secondary | ICD-10-CM

## 2018-10-30 DIAGNOSIS — R279 Unspecified lack of coordination: Secondary | ICD-10-CM

## 2018-10-30 NOTE — Therapy (Signed)
[facility] [address] [place], [place], [address] Phone: [phone]   Fax:  [phone]  Physical Therapy Treatment  Patient Details  Name: Charles [name] MRN: [id number] Date of Birth: 05/23/1965 Referring Provider (PT): Ladell Pier, MD   Encounter Date: 10/30/2018  PT End of Session - 10/30/18 0936    Visit Number  10    Date for PT Re-Evaluation  12/22/18    PT Start Time  0932    PT Stop Time  1012    PT Time Calculation (min)  40 min    Activity Tolerance  Patient tolerated treatment well    Behavior During Therapy  [facility] for tasks assessed/performed       Past Medical History:  Diagnosis Date  . Diabetes mellitus without complication ([place])    Type II  . Family history of colon cancer   . Family history of prostate cancer   . Family history of stomach cancer   . Family history of uterine cancer   . History of chemotherapy LAST CHEMO NOV 2019  . Hyperlipidemia   . Neuropathy    FEET AND HANDS PAIN IN LEGS AT TIMES    Past Surgical History:  Procedure Laterality Date  . ACHILLES TENDON REPAIR Right 2005  . APPENDECTOMY    . CYSTOSCOPY WITH STENT PLACEMENT Bilateral 01/17/2018   Procedure: CYSTOSCOPY WITH STENT PLACEMENT;  Surgeon: Kathie Rhodes, MD;  [facility];  Service: Urology;  Laterality: Bilateral;  . FLEXIBLE SIGMOIDOSCOPY N/A 01/17/2018   Procedure: FLEXIBLE SIGMOIDOSCOPY;  Surgeon: Ileana Roup, MD;  [facility];  Service: General;  Laterality: N/A;  . HERNIA REPAIR     as a baby  . ILEOSTOMY N/A 01/17/2018   Procedure: possible diverting LOOP ILEOSTOMY;  Surgeon: Ileana Roup, MD;  [facility];  Service: General;  Laterality: N/A;  . PORT-A-CATH REMOVAL Right 02/17/2018   Procedure: REMOVAL OF PORT-A-CATH;  Surgeon: Ileana Roup, MD;  [facility];  Service: General;  Laterality: Right;  . PORTACATH PLACEMENT N/A 06/17/2017   Procedure: RIGHT  VS LEFT INTERNAL JUGULAR PORT-A-CATH PLACEMENT WITH ULTRASOUND;  Surgeon: Ileana Roup, MD;  [facility];  Service: General;  Laterality: N/A;  FLURO    There were no vitals filed for this visit.  Subjective Assessment - 10/30/18 0937    Subjective  I have been sitting leaning forward more and noticed my stomach has been more sore.    Currently in Pain?  Yes    Pain Score  5     Pain Location  Abdomen    Pain Orientation  Anterior;Lower    Pain Descriptors / Indicators  Aching    Pain Onset  More than a month ago    Pain Frequency  Intermittent    Multiple Pain Sites  No                       [facility] Adult PT Treatment/Exercise - 10/30/18 0001      Lumbar Exercises: Stretches   Figure 4 Stretch  --   10 x 3sec   Gastroc Stretch  Right;Left;3 reps;20 seconds      Lumbar Exercises: Aerobic   Nustep  seat/UE 10; L3 x 8 min   PT present for status update     Lumbar Exercises: Machines for Strengthening   Leg Press  seat 8; bilat 80# - 3x10   cue to engage core and pelvic  floor     Lumbar Exercises: Standing   Functional Squats  10 reps   eccentric pelvic floor contraction; 10 x 5 sec   Row  Strengthening;Power tower;Both;10 reps   3 sets ; 25#   Shoulder Extension  Strengthening;Power Tower;Both;10 reps   3 sets; 20#   Other Standing Lumbar Exercises  D1 ext bilat shoulders 20# - 2x10      Lumbar Exercises: Supine   Bridge  10 reps    Bridge Limitations  LE on red pball    Large Ball Oblique Isometric Limitations  lumbar flexion on ball keeping core engaged      Knee/Hip Exercises: Standing   Hip Flexion  Stengthening;Both;20 reps;Knee straight   yellow   Hip Abduction  Stengthening;Both;15 reps   yellow band   Hip Extension  Stengthening;Both;15 reps;Knee straight   yellow              PT Short Term Goals - 10/13/18 0820      PT SHORT TERM GOAL #1   Title  Pt will report 25% less abdominal pain with sitting    Baseline   45-50% better    Status  Achieved      PT SHORT TERM GOAL #2   Title  ind with intital HEP    Baseline  initial HEP - stretches doing every day    Status  Achieved        PT Long Term Goals - 10/20/18 4315      PT LONG TERM GOAL #1   Title  Pt will report 60% less pain when sitting for work due to improved core strength and posture    Baseline  40-50% better    Status  Partially Met      PT LONG TERM GOAL #2   Title  Pt will demonstrate 5/5 MMT hip abduction for improved gait with return to walking routine    Status  On-going      PT LONG TERM GOAL #3   Title  pt will demonstrate at least 20 mV contraction of pelvic floor and able to hold 10 sec for 5 reps    Status  On-going      PT LONG TERM GOAL #4   Title  pt will demonstrate 5 times sit to stand 10 seconds or less due to improved and maintained strength with exercise program    Status  On-going      PT LONG TERM GOAL #5   Title  Pt will report ability to have complete bowel movement without excessive bearing down due to improved muscle strength and coordination    Status  On-going            Plan - 10/30/18 1012    Clinical Impression Statement  Pt continues to need cues to demonstrate improved posture.  Pt is monitored for increased pain throughout treatment. He has no increased pain, just reports muscle fatigue.  Pt was able to increase resistance and difficulty of several exercises today.  Pt will continue to benefit from skilled PT to build endurance so he can perform all job related and functional tasks without increased pain    PT Treatment/Interventions  ADLs/Self Care Home Management;Biofeedback;Cryotherapy;Electrical Stimulation;Moist Heat;Neuromuscular re-education;Therapeutic activities;Therapeutic exercise;Manual techniques;Patient/family education;Dry needling;Taping;Passive range of motion    PT Next Visit Plan  f/u on shoulder pain, core strength, advance core and pelvic floor as tolerated, posture and  posture strength, progress squats and balance    PT Home Exercise Plan  Access Code: [email or web address]    Consulted and Agree with Plan of Care  Patient       Patient will benefit from skilled therapeutic intervention in order to improve the following deficits and impairments:  Decreased activity tolerance, Decreased strength, Pain, Increased fascial restricitons, Impaired flexibility, Decreased coordination  Visit Diagnosis: Muscle weakness (generalized)  Unspecified lack of coordination     Problem List Patient Active Problem List   Diagnosis Date Noted  . Insulin-requiring or dependent type II diabetes mellitus ([place])   . Hyperlipidemia   . Neuropathy   . Genetic testing 08/27/2017  . Family history of colon cancer   . Family history of uterine cancer   . Family history of stomach cancer   . Family history of prostate cancer   . Port-A-Cath in place 06/20/2017  . Rectal cancer s/p robotic LAR rectosigmoid resection 01/17/2018 06/12/2017  . Other and unspecified hyperlipidemia 06/19/2013    Jule Ser, PT 10/30/2018, 10:17 AM  [facility] [address] [place], [place], [address] Phone: [phone]   Fax:  [phone]  Name: Charles [name] MRN: [id number] Date of Birth: Apr 10, 1965

## 2018-10-31 ENCOUNTER — Encounter: Payer: 59 | Admitting: Physical Therapy

## 2018-11-03 ENCOUNTER — Ambulatory Visit: Payer: 59 | Admitting: Physical Therapy

## 2018-11-03 ENCOUNTER — Other Ambulatory Visit: Payer: Self-pay

## 2018-11-03 ENCOUNTER — Encounter: Payer: Self-pay | Admitting: Physical Therapy

## 2018-11-03 DIAGNOSIS — M6281 Muscle weakness (generalized): Secondary | ICD-10-CM | POA: Diagnosis not present

## 2018-11-03 DIAGNOSIS — R279 Unspecified lack of coordination: Secondary | ICD-10-CM

## 2018-11-03 NOTE — Therapy (Signed)
[facility] [address] [place], [place], [address] Phone: [phone]   Fax:  [phone]  Physical Therapy Treatment  Patient Details  Name: Charles [name] MRN: [id number] Date of Birth: [birth year] Referring Provider (PT): Ladell Pier, MD   Encounter Date: 11/03/2018  PT End of Session - 11/03/18 0806    Visit Number  11    Date for PT Re-Evaluation  12/22/18    PT Start Time  0802    PT Stop Time  0842    PT Time Calculation (min)  40 min    Activity Tolerance  Patient tolerated treatment well    Behavior During Therapy  [facility] for tasks assessed/performed       Past Medical History:  Diagnosis Date  . Diabetes mellitus without complication ([place])    Type II  . Family history of colon cancer   . Family history of prostate cancer   . Family history of stomach cancer   . Family history of uterine cancer   . History of chemotherapy LAST CHEMO NOV 2019  . Hyperlipidemia   . Neuropathy    FEET AND HANDS PAIN IN LEGS AT TIMES    Past Surgical History:  Procedure Laterality Date  . ACHILLES TENDON REPAIR Right 2005  . APPENDECTOMY    . CYSTOSCOPY WITH STENT PLACEMENT Bilateral 01/17/2018   Procedure: CYSTOSCOPY WITH STENT PLACEMENT;  Surgeon: Kathie Rhodes, MD;  Location: [facility];  Service: Urology;  Laterality: Bilateral;  . FLEXIBLE SIGMOIDOSCOPY N/A 01/17/2018   Procedure: FLEXIBLE SIGMOIDOSCOPY;  Surgeon: Ileana Roup, MD;  Location: [facility];  Service: General;  Laterality: N/A;  . HERNIA REPAIR     as a baby  . ILEOSTOMY N/A 01/17/2018   Procedure: possible diverting LOOP ILEOSTOMY;  Surgeon: Ileana Roup, MD;  [facility]: [facility];  Service: General;  Laterality: N/A;  . PORT-A-CATH REMOVAL Right 02/17/2018   Procedure: REMOVAL OF PORT-A-CATH;  Surgeon: Ileana Roup, MD;  [facility];  Service: General;  Laterality: Right;  . PORTACATH PLACEMENT N/A 06/17/2017   Procedure: RIGHT  VS LEFT INTERNAL JUGULAR PORT-A-CATH PLACEMENT WITH ULTRASOUND;  Surgeon: Ileana Roup, MD;  [facility];  Service: General;  Laterality: N/A;  FLURO    There were no vitals filed for this visit.  Subjective Assessment - 11/03/18 0844    Subjective  My shoulder is doing better.  I did some of the exercises this weekend.    Currently in Pain?  No/denies                       [facility] Adult PT Treatment/Exercise - 11/03/18 0001      Lumbar Exercises: Stretches   Active Hamstring Stretch  3 reps;30 seconds   standing     Lumbar Exercises: Machines for Strengthening   Leg Press  seat 8; bilat 90# - 3x10   cue to engage core and pelvic floor     Lumbar Exercises: Standing   Functional Squats  4 seconds;20 reps   eccentric pelvic floor contraction; 10 x count of 5 sec   Row  Strengthening;Power tower;Both;10 reps   3 sets ; 30#   Shoulder Extension  Strengthening;Power Tower;Both;10 reps   3 sets; 30#   Other Standing Lumbar Exercises  D1 ext bilat shoulders 30# - 2x10      Lumbar Exercises: Supine   Bridge  10 reps    Bridge Limitations  LE on red  pball    Bridge with [employer]  10 reps    Bridge with clamshell  10 reps      Knee/Hip Exercises: Standing   Hip Flexion  Stengthening;Both;20 reps;Knee straight   yellow   Hip Abduction  Stengthening;Both;20 reps   yellow band   Hip Extension  Stengthening;Both;Knee straight;20 reps   yellow              PT Short Term Goals - 10/13/18 0820      PT SHORT TERM GOAL #1   Title  Pt will report 25% less abdominal pain with sitting    Baseline  45-50% better    Status  Achieved      PT SHORT TERM GOAL #2   Title  ind with intital HEP    Baseline  initial HEP - stretches doing every day    Status  Achieved        PT Long Term Goals - 10/20/18 7371      PT LONG TERM GOAL #1   Title  Pt will report 60% less pain when sitting for work due to improved core strength and posture     Baseline  40-50% better    Status  Partially Met      PT LONG TERM GOAL #2   Title  Pt will demonstrate 5/5 MMT hip abduction for improved gait with return to walking routine    Status  On-going      PT LONG TERM GOAL #3   Title  pt will demonstrate at least 20 mV contraction of pelvic floor and able to hold 10 sec for 5 reps    Status  On-going      PT LONG TERM GOAL #4   Title  pt will demonstrate 5 times sit to stand 10 seconds or less due to improved and maintained strength with exercise program    Status  On-going      PT LONG TERM GOAL #5   Title  Pt will report ability to have complete bowel movement without excessive bearing down due to improved muscle strength and coordination    Status  On-going            Plan - 11/03/18 0840    Clinical Impression Statement  Pt did well with the exercises today and was able to increase resistance and difficulty level.  Pt reports his shoulder is feeling better.  Pt continues to need cues to use his core for improved posture during the exercises.  pt will benefit from skilled PT to continue to work on strength and endurance as well as posture.    PT Treatment/Interventions  ADLs/Self Care Home Management;Biofeedback;Cryotherapy;Electrical Stimulation;Moist Heat;Neuromuscular re-education;Therapeutic activities;Therapeutic exercise;Manual techniques;Patient/family education;Dry needling;Taping;Passive range of motion    PT Next Visit Plan  progress strengthening, posture and core    PT Home Exercise Plan  Access Code: [email or web address]    Consulted and Agree with Plan of Care  Patient       Patient will benefit from skilled therapeutic intervention in order to improve the following deficits and impairments:  Decreased activity tolerance, Decreased strength, Pain, Increased fascial restricitons, Impaired flexibility, Decreased coordination  Visit Diagnosis: Muscle weakness (generalized)  Unspecified lack of coordination     Problem  List Patient Active Problem List   Diagnosis Date Noted  . Insulin-requiring or dependent type II diabetes mellitus ([place])   . Hyperlipidemia   . Neuropathy   . Genetic testing 08/27/2017  . Family history of colon  cancer   . Family history of uterine cancer   . Family history of stomach cancer   . Family history of prostate cancer   . Port-A-Cath in place 06/20/2017  . Rectal cancer s/p robotic LAR rectosigmoid resection 01/17/2018 06/12/2017  . Other and unspecified hyperlipidemia 06/19/2013    Jule Ser, PT 11/03/2018, 8:45 AM  [facility] [address] [place], [place], [address] Phone: [phone]   Fax:  [phone]  Name: Charles [name] MRN: [id number] Date of Birth: 1965-06-03

## 2018-11-07 ENCOUNTER — Other Ambulatory Visit: Payer: Self-pay

## 2018-11-07 ENCOUNTER — Ambulatory Visit: Payer: 59 | Admitting: Physical Therapy

## 2018-11-07 ENCOUNTER — Encounter: Payer: Self-pay | Admitting: Physical Therapy

## 2018-11-07 DIAGNOSIS — R279 Unspecified lack of coordination: Secondary | ICD-10-CM

## 2018-11-07 DIAGNOSIS — M6281 Muscle weakness (generalized): Secondary | ICD-10-CM | POA: Diagnosis not present

## 2018-11-07 NOTE — Therapy (Signed)
[facility] [address], [place], [place], [address] Phone: [phone]   Fax:  [phone]  Physical Therapy Treatment  Patient Details  Name: Charles [name] MRN: [id number] Date of Birth: 1965-03-30 Referring Provider (PT): Ladell Pier, MD   Encounter Date: 11/07/2018  PT End of Session - 11/07/18 0804    Visit Number  12    Date for PT Re-Evaluation  12/22/18    PT Start Time  0802    PT Stop Time  0842    PT Time Calculation (min)  40 min    Activity Tolerance  Patient tolerated treatment well    Behavior During Therapy  [facility] for tasks assessed/performed       Past Medical History:  Diagnosis Date  . Diabetes mellitus without complication ([place])    Type II  . Family history of colon cancer   . Family history of prostate cancer   . Family history of stomach cancer   . Family history of uterine cancer   . History of chemotherapy LAST CHEMO NOV 2019  . Hyperlipidemia   . Neuropathy    FEET AND HANDS PAIN IN LEGS AT TIMES    Past Surgical History:  Procedure Laterality Date  . ACHILLES TENDON REPAIR Right 2005  . APPENDECTOMY    . CYSTOSCOPY WITH STENT PLACEMENT Bilateral 01/17/2018   Procedure: CYSTOSCOPY WITH STENT PLACEMENT;  Surgeon: Kathie Rhodes, MD;  [facility];  Service: Urology;  Laterality: Bilateral;  . FLEXIBLE SIGMOIDOSCOPY N/A 01/17/2018   Procedure: FLEXIBLE SIGMOIDOSCOPY;  Surgeon: Ileana Roup, MD;  [facility];  Service: General;  Laterality: N/A;  . HERNIA REPAIR     as a baby  . ILEOSTOMY N/A 01/17/2018   Procedure: possible diverting LOOP ILEOSTOMY;  Surgeon: Ileana Roup, MD;  [facility];  Service: General;  Laterality: N/A;  . PORT-A-CATH REMOVAL Right 02/17/2018   Procedure: REMOVAL OF PORT-A-CATH;  Surgeon: Ileana Roup, MD;  [facility];  Service: General;  Laterality: Right;  . PORTACATH PLACEMENT N/A 06/17/2017   Procedure: RIGHT  VS LEFT INTERNAL JUGULAR PORT-A-CATH PLACEMENT WITH ULTRASOUND;  Surgeon: Ileana Roup, MD;  [facility];  Service: General;  Laterality: N/A;  FLURO    There were no vitals filed for this visit.  Subjective Assessment - 11/07/18 0807    Subjective  No new complaints    Patient Stated Goals  get bathroom more consistent    Currently in Pain?  No/denies                       [facility] Adult PT Treatment/Exercise - 11/07/18 0001      Lumbar Exercises: Stretches   Double Knee to Chest Stretch Limitations  10x 5 sec rolling red ball    Figure 4 Stretch  --   10 x 3sec rolling red ball   Other Lumbar Stretch Exercise  sidebend to the Rt against the wall    Other Lumbar Stretch Exercise  side bend rolling red ball in modified childs pose - 10 sec x 3 x each side      Lumbar Exercises: Aerobic   Nustep  seat/UE 10; L3 x 8 min   PT present for status update     Lumbar Exercises: Machines for Strengthening   Leg Press  seat 8; bilat 90# - 3x10   cue to engage core and pelvic floor     Lumbar Exercises: Standing  Functional Squats  4 seconds;20 reps   eccentric pelvic floor contraction; 10 x count of 5 sec   Row  Strengthening;Power tower;Both;10 reps   3 sets ; 30#   Shoulder Extension  Strengthening;Power Tower;Both;10 reps   3 sets; 30#   Other Standing Lumbar Exercises  D1 ext bilat shoulders 30# - 3x10      Lumbar Exercises: Quadruped   Single Arm Raise  10 reps;2 seconds    Straight Leg Raise  10 reps;2 seconds      Knee/Hip Exercises: Standing   Other Standing Knee Exercises  yellow band - monster walks 2 laps and side step 2 laps               PT Short Term Goals - 10/13/18 0820      PT SHORT TERM GOAL #1   Title  Pt will report 25% less abdominal pain with sitting    Baseline  45-50% better    Status  Achieved      PT SHORT TERM GOAL #2   Title  ind with intital HEP    Baseline  initial HEP - stretches doing every day    Status   Achieved        PT Long Term Goals - 10/20/18 4235      PT LONG TERM GOAL #1   Title  Pt will report 60% less pain when sitting for work due to improved core strength and posture    Baseline  40-50% better    Status  Partially Met      PT LONG TERM GOAL #2   Title  Pt will demonstrate 5/5 MMT hip abduction for improved gait with return to walking routine    Status  On-going      PT LONG TERM GOAL #3   Title  pt will demonstrate at least 20 mV contraction of pelvic floor and able to hold 10 sec for 5 reps    Status  On-going      PT LONG TERM GOAL #4   Title  pt will demonstrate 5 times sit to stand 10 seconds or less due to improved and maintained strength with exercise program    Status  On-going      PT LONG TERM GOAL #5   Title  Pt will report ability to have complete bowel movement without excessive bearing down due to improved muscle strength and coordination    Status  On-going            Plan - 11/07/18 0922    Clinical Impression Statement  Pt needed cues to put equal weight in bilat LE during squats and noticed that he was leaning to the left. He was able to correct with cues, but he was unaware that he was leaning.  States his wife tells him he does sit crooked at home.  Pt was able to progress to a couple of new exercises in quadruped today needing verbal and tactile cues to maintain core stability.    PT Treatment/Interventions  ADLs/Self Care Home Management;Biofeedback;Cryotherapy;Electrical Stimulation;Moist Heat;Neuromuscular re-education;Therapeutic activities;Therapeutic exercise;Manual techniques;Patient/family education;Dry needling;Taping;Passive range of motion    PT Next Visit Plan  progress strengthening, posture and core    PT Home Exercise Plan  Access Code: [email or web address]    Consulted and Agree with Plan of Care  Patient       Patient will benefit from skilled therapeutic intervention in order to improve the following deficits and impairments:   Decreased activity tolerance, Decreased  strength, Pain, Increased fascial restricitons, Impaired flexibility, Decreased coordination  Visit Diagnosis: Muscle weakness (generalized)  Unspecified lack of coordination     Problem List Patient Active Problem List   Diagnosis Date Noted  . Insulin-requiring or dependent type II diabetes mellitus ([place])   . Hyperlipidemia   . Neuropathy   . Genetic testing 08/27/2017  . Family history of colon cancer   . Family history of uterine cancer   . Family history of stomach cancer   . Family history of prostate cancer   . Port-A-Cath in place 06/20/2017  . Rectal cancer s/p robotic LAR rectosigmoid resection 01/17/2018 06/12/2017  . Other and unspecified hyperlipidemia 06/19/2013    Jule Ser, PT 11/07/2018, 11:23 AM  [facility] [address], [place], [place], [address] Phone: [phone]   Fax:  [phone]  Name: Charles [name] MRN: [id number] Date of Birth: February 10, 1965

## 2018-11-10 ENCOUNTER — Other Ambulatory Visit: Payer: Self-pay

## 2018-11-10 ENCOUNTER — Encounter: Payer: Self-pay | Admitting: Physical Therapy

## 2018-11-10 ENCOUNTER — Ambulatory Visit: Payer: 59 | Attending: Oncology | Admitting: Physical Therapy

## 2018-11-10 DIAGNOSIS — R279 Unspecified lack of coordination: Secondary | ICD-10-CM | POA: Diagnosis present

## 2018-11-10 DIAGNOSIS — M6281 Muscle weakness (generalized): Secondary | ICD-10-CM | POA: Diagnosis present

## 2018-11-10 NOTE — Therapy (Signed)
[facility] [address], [place], [place], [address] Phone: [phone]   Fax:  [phone]  Physical Therapy Treatment  Patient Details  Name: Charles [name] MRN: [id number] Date of Birth: August 23, 1965 Referring Provider (PT): Ladell Pier, MD   Encounter Date: 11/10/2018  PT End of Session - 11/10/18 0803    Visit Number  13    Date for PT Re-Evaluation  12/22/18    PT Start Time  0801    PT Stop Time  0840    PT Time Calculation (min)  39 min    Activity Tolerance  Patient tolerated treatment well    Behavior During Therapy  [facility] for tasks assessed/performed       Past Medical History:  Diagnosis Date  . Diabetes mellitus without complication ([place])    Type II  . Family history of colon cancer   . Family history of prostate cancer   . Family history of stomach cancer   . Family history of uterine cancer   . History of chemotherapy LAST CHEMO NOV 2019  . Hyperlipidemia   . Neuropathy    FEET AND HANDS PAIN IN LEGS AT TIMES    Past Surgical History:  Procedure Laterality Date  . ACHILLES TENDON REPAIR Right 2005  . APPENDECTOMY    . CYSTOSCOPY WITH STENT PLACEMENT Bilateral 01/17/2018   Procedure: CYSTOSCOPY WITH STENT PLACEMENT;  Surgeon: Kathie Rhodes, MD;  [facility];  Service: Urology;  Laterality: Bilateral;  . FLEXIBLE SIGMOIDOSCOPY N/A 01/17/2018   Procedure: FLEXIBLE SIGMOIDOSCOPY;  Surgeon: Ileana Roup, MD;  [facility];  Service: General;  Laterality: N/A;  . HERNIA REPAIR     as a baby  . ILEOSTOMY N/A 01/17/2018   Procedure: possible diverting LOOP ILEOSTOMY;  Surgeon: Ileana Roup, MD;  [facility];  Service: General;  Laterality: N/A;  . PORT-A-CATH REMOVAL Right 02/17/2018   Procedure: REMOVAL OF PORT-A-CATH;  Surgeon: Ileana Roup, MD;  [facility];  Service: General;  Laterality: Right;  . PORTACATH PLACEMENT N/A 06/17/2017   Procedure: RIGHT  VS LEFT INTERNAL JUGULAR PORT-A-CATH PLACEMENT WITH ULTRASOUND;  Surgeon: Ileana Roup, MD;  [facility];  Service: General;  Laterality: N/A;  FLURO    There were no vitals filed for this visit.  Subjective Assessment - 11/10/18 0844    Subjective  Pt states there is no pain this morning.    Currently in Pain?  No/denies                       [facility] Adult PT Treatment/Exercise - 11/10/18 0001      Lumbar Exercises: Aerobic   Nustep  seat/UE 10; L5 x 8 min - 45 sec intervals   PT present for status update     Lumbar Exercises: Machines for Strengthening   Leg Press  seat 8; bilat 90# - 3x10   cue to engage core and pelvic floor     Lumbar Exercises: Standing   Row  Strengthening;Power tower;Both;10 reps   3 sets ; 35#   Shoulder Extension  Strengthening;Power Tower;Both;10 reps   3 sets; 30#     Lumbar Exercises: Supine   Bridge  20 reps   on BOSU   Straight Leg Raise  20 reps    Straight Leg Raises Limitations  2#      Lumbar Exercises: Sidelying   Hip Abduction  Right;Left;20 reps    Hip Abduction  Weights (lbs)  2      Lumbar Exercises: Quadruped   Straight Leg Raise  10 reps;2 seconds   2lb   Straight Leg Raises Limitations  donkey kicks - 10x each no weight      Knee/Hip Exercises: Standing   Lateral Step Up  Right;Left;Hand Hold: 0;20 reps;Step Height: 6"               PT Short Term Goals - 10/13/18 0820      PT SHORT TERM GOAL #1   Title  Pt will report 25% less abdominal pain with sitting    Baseline  45-50% better    Status  Achieved      PT SHORT TERM GOAL #2   Title  ind with intital HEP    Baseline  initial HEP - stretches doing every day    Status  Achieved        PT Long Term Goals - 10/20/18 5176      PT LONG TERM GOAL #1   Title  Pt will report 60% less pain when sitting for work due to improved core strength and posture    Baseline  40-50% better    Status  Partially Met      PT LONG TERM GOAL #2    Title  Pt will demonstrate 5/5 MMT hip abduction for improved gait with return to walking routine    Status  On-going      PT LONG TERM GOAL #3   Title  pt will demonstrate at least 20 mV contraction of pelvic floor and able to hold 10 sec for 5 reps    Status  On-going      PT LONG TERM GOAL #4   Title  pt will demonstrate 5 times sit to stand 10 seconds or less due to improved and maintained strength with exercise program    Status  On-going      PT LONG TERM GOAL #5   Title  Pt will report ability to have complete bowel movement without excessive bearing down due to improved muscle strength and coordination    Status  On-going            Plan - 11/10/18 0840    Clinical Impression Statement  Pt was able to increase resistance today.  He did well with level 5 on nustep and was able to really push himself with 45 second intervals and 45 second slow on and off for 9  minutes.  Pt needed some cues for LE alignment during exercises to activate the gluteals as intended. Pt will continue to benefit from skilled PT to porgress core and LE strength.    PT Treatment/Interventions  ADLs/Self Care Home Management;Biofeedback;Cryotherapy;Electrical Stimulation;Moist Heat;Neuromuscular re-education;Therapeutic activities;Therapeutic exercise;Manual techniques;Patient/family education;Dry needling;Taping;Passive range of motion    PT Next Visit Plan  progress strengthening, posture and core    PT Home Exercise Plan  Access Code: [email or web address]    Consulted and Agree with Plan of Care  Patient       Patient will benefit from skilled therapeutic intervention in order to improve the following deficits and impairments:  Decreased activity tolerance, Decreased strength, Pain, Increased fascial restricitons, Impaired flexibility, Decreased coordination  Visit Diagnosis: Muscle weakness (generalized)  Unspecified lack of coordination     Problem List Patient Active Problem List   Diagnosis Date  Noted  . Insulin-requiring or dependent type II diabetes mellitus ([name])   . Hyperlipidemia   . Neuropathy   . Genetic testing  08/27/2017  . Family history of colon cancer   . Family history of uterine cancer   . Family history of stomach cancer   . Family history of prostate cancer   . Port-A-Cath in place 06/20/2017  . Rectal cancer s/p robotic LAR rectosigmoid resection 01/17/2018 06/12/2017  . Other and unspecified hyperlipidemia 06/19/2013    Jule Ser, PT 11/10/2018, 8:44 AM  [facility] [address] [place], [place], [address] Phone: [phone]   Fax:  [phone]  Name: Tyresse Jayson MRN: [id number] Date of Birth: December 13, 1965

## 2018-11-14 ENCOUNTER — Other Ambulatory Visit: Payer: Self-pay

## 2018-11-14 ENCOUNTER — Encounter: Payer: Self-pay | Admitting: Physical Therapy

## 2018-11-14 ENCOUNTER — Ambulatory Visit: Payer: 59 | Admitting: Physical Therapy

## 2018-11-14 DIAGNOSIS — R279 Unspecified lack of coordination: Secondary | ICD-10-CM

## 2018-11-14 DIAGNOSIS — M6281 Muscle weakness (generalized): Secondary | ICD-10-CM | POA: Diagnosis not present

## 2018-11-14 NOTE — Therapy (Signed)
[facility] [address], [place], [place], [address] Phone: [phone]   Fax:  [phone]  Physical Therapy Treatment  Patient Details  Name: Charles [name] MRN: [id number] Date of Birth: 1965-02-28 Referring Provider (Charles [name]): Ladell Pier, MD   Encounter Date: 11/14/2018  Charles [name] End of Session - 11/14/18 0800    Visit Number  14    Date for Charles [name] Re-Evaluation  12/22/18    Charles [name] Start Time  0759    Charles [name] Stop Time  0839    Charles [name] Time Calculation (min)  40 min    Activity Tolerance  Patient tolerated treatment well    Behavior During Therapy  [facility] for tasks assessed/performed       Past Medical History:  Diagnosis Date  . Diabetes mellitus without complication ([place])    Type II  . Family history of colon cancer   . Family history of prostate cancer   . Family history of stomach cancer   . Family history of uterine cancer   . History of chemotherapy LAST CHEMO NOV 2019  . Hyperlipidemia   . Neuropathy    FEET AND HANDS PAIN IN LEGS AT TIMES    Past Surgical History:  Procedure Laterality Date  . ACHILLES TENDON REPAIR Right 2005  . APPENDECTOMY    . CYSTOSCOPY WITH STENT PLACEMENT Bilateral 01/17/2018   Procedure: CYSTOSCOPY WITH STENT PLACEMENT;  Surgeon: Kathie Rhodes, MD;  Location: [facility];  Service: Urology;  Laterality: Bilateral;  . FLEXIBLE SIGMOIDOSCOPY N/A 01/17/2018   Procedure: FLEXIBLE SIGMOIDOSCOPY;  Surgeon: Ileana Roup, MD;  Location: [facility];  Service: General;  Laterality: N/A;  . HERNIA REPAIR     as a baby  . ILEOSTOMY N/A 01/17/2018   Procedure: possible diverting LOOP ILEOSTOMY;  Surgeon: Ileana Roup, MD;  [facility];  Service: General;  Laterality: N/A;  . PORT-A-CATH REMOVAL Right 02/17/2018   Procedure: REMOVAL OF PORT-A-CATH;  Surgeon: Ileana Roup, MD;  [facility];  Service: General;  Laterality: Right;  . PORTACATH PLACEMENT N/A 06/17/2017   Procedure: RIGHT  VS LEFT INTERNAL JUGULAR PORT-A-CATH PLACEMENT WITH ULTRASOUND;  Surgeon: Ileana Roup, MD;  [facility];  Service: General;  Laterality: N/A;  FLURO    There were no vitals filed for this visit.  Subjective Assessment - 11/14/18 0801    Subjective  I am tired and a little soreness from being on the toilet longer this morning.    Patient Stated Goals  get bathroom more consistent    Currently in Pain?  No/denies                       [facility] Adult Charles [name] Treatment/Exercise - 11/14/18 0001      Lumbar Exercises: Aerobic   Nustep  seat/UE 10; L5 x 8 min - 30 sec intervals   Charles [name] present for status update     Lumbar Exercises: Supine   Straight Leg Raise  20 reps    Straight Leg Raises Limitations  2#    Other Supine Lumbar Exercises  foam roll under sacrum - knees to chest rocking;     Other Supine Lumbar Exercises  lying on foam rolling lengthways - bent knee fall out, UE horizontal abduction green, diagonal green band, clam green band, marching - 20x each      Lumbar Exercises: Sidelying   Hip Abduction  Right;Left;20 reps    Hip Abduction Weights (lbs)  2  Other Sidelying Lumbar Exercises  hip adduction bilat - 2 lb - 20x               Charles [name] Short Term Goals - 10/13/18 0820      Charles [name] SHORT TERM GOAL #1   Title  Charles [name] will report 25% less abdominal pain with sitting    Baseline  45-50% better    Status  Achieved      Charles [name] SHORT TERM GOAL #2   Title  ind with intital HEP    Baseline  initial HEP - stretches doing every day    Status  Achieved        Charles [name] Long Term Goals - 11/14/18 0846      Charles [name] LONG TERM GOAL #1   Title  Charles [name] will report 60% less pain when sitting for work due to improved core strength and posture    Status  On-going      Charles [name] LONG TERM GOAL #2   Title  Charles [name] will demonstrate 5/5 MMT hip abduction for improved gait with return to walking routine    Status  On-going      Charles [name] LONG TERM GOAL #5   Title  Charles [name] will report ability to have  complete bowel movement without excessive bearing down due to improved muscle strength and coordination    Baseline  more today, but overall better    Status  On-going            Plan - 11/14/18 0843    Clinical Impression Statement  Charles [name] did well during exercises today.  He had some stiffness in low back after sitting up, but was able to release with stretches.  Charles [name] demonstrated greater core stability after using the foam roller and was able to perform marching without UE.  Charles [name] will benefit from skilled Charles [name] to continue core and pelvic floor strength.    Comorbidities  hx of rectal cancer, chemo, radiation, rectal surgery    Charles [name] Treatment/Interventions  ADLs/Self Care Home Management;Biofeedback;Cryotherapy;Electrical Stimulation;Moist Heat;Neuromuscular re-education;Therapeutic activities;Therapeutic exercise;Manual techniques;Patient/family education;Dry needling;Taping;Passive range of motion    Charles [name] Next Visit Plan  progress strengthening, posture and core    Charles [name] Home Exercise Plan  Access Code: [email or web address]    Consulted and Agree with Plan of Care  Patient       Patient will benefit from skilled therapeutic intervention in order to improve the following deficits and impairments:  Decreased activity tolerance, Decreased strength, Pain, Increased fascial restricitons, Impaired flexibility, Decreased coordination  Visit Diagnosis: Muscle weakness (generalized)  Unspecified lack of coordination     Problem List Patient Active Problem List   Diagnosis Date Noted  . Insulin-requiring or dependent type II diabetes mellitus ([place])   . Hyperlipidemia   . Neuropathy   . Genetic testing 08/27/2017  . Family history of colon cancer   . Family history of uterine cancer   . Family history of stomach cancer   . Family history of prostate cancer   . Port-A-Cath in place 06/20/2017  . Rectal cancer s/p robotic LAR rectosigmoid resection 01/17/2018 06/12/2017  . Other and unspecified hyperlipidemia  06/19/2013    Charles [name], Charles [name] 11/14/2018, 8:47 AM  [facility] [address], [place], [place], [address] Phone: [phone]   Fax:  [phone]  Name: Charles [name] MRN: [id number] Date of Birth: 25-Aug-1965

## 2018-11-17 ENCOUNTER — Other Ambulatory Visit: Payer: Self-pay

## 2018-11-17 ENCOUNTER — Ambulatory Visit: Payer: 59 | Admitting: Physical Therapy

## 2018-11-17 ENCOUNTER — Encounter: Payer: Self-pay | Admitting: Physical Therapy

## 2018-11-17 DIAGNOSIS — M6281 Muscle weakness (generalized): Secondary | ICD-10-CM

## 2018-11-17 DIAGNOSIS — R279 Unspecified lack of coordination: Secondary | ICD-10-CM

## 2018-11-17 NOTE — Therapy (Signed)
[facility] [address] [place], [place], [address] Phone: [phone]   Fax:  [phone]  Physical Therapy Treatment  Patient Details  Name: Charles [name] MRN: [id number] Date of Birth: 12/03/1965 Referring Provider (PT): Ladell Pier, MD   Encounter Date: 11/17/2018  PT End of Session - 11/17/18 1006    Visit Number  15    Date for PT Re-Evaluation  12/22/18    PT Start Time  0927    PT Stop Time  1010    PT Time Calculation (min)  43 min    Activity Tolerance  Patient tolerated treatment well    Behavior During Therapy  [facility] for tasks assessed/performed       Past Medical History:  Diagnosis Date  . Diabetes mellitus without complication ([place])    Type II  . Family history of colon cancer   . Family history of prostate cancer   . Family history of stomach cancer   . Family history of uterine cancer   . History of chemotherapy LAST CHEMO NOV 2019  . Hyperlipidemia   . Neuropathy    FEET AND HANDS PAIN IN LEGS AT TIMES    Past Surgical History:  Procedure Laterality Date  . ACHILLES TENDON REPAIR Right 2005  . APPENDECTOMY    . CYSTOSCOPY WITH STENT PLACEMENT Bilateral 01/17/2018   Procedure: CYSTOSCOPY WITH STENT PLACEMENT;  Surgeon: Kathie Rhodes, MD;  [facility];  Service: Urology;  Laterality: Bilateral;  . FLEXIBLE SIGMOIDOSCOPY N/A 01/17/2018   Procedure: FLEXIBLE SIGMOIDOSCOPY;  Surgeon: Ileana Roup, MD;  [facility];  Service: General;  Laterality: N/A;  . HERNIA REPAIR     as a baby  . ILEOSTOMY N/A 01/17/2018   Procedure: possible diverting LOOP ILEOSTOMY;  Surgeon: Ileana Roup, MD;  [facility];  Service: General;  Laterality: N/A;  . PORT-A-CATH REMOVAL Right 02/17/2018   Procedure: REMOVAL OF PORT-A-CATH;  Surgeon: Ileana Roup, MD;  [facility];  Service: General;  Laterality: Right;  . PORTACATH PLACEMENT N/A 06/17/2017   Procedure: RIGHT  VS LEFT INTERNAL JUGULAR PORT-A-CATH PLACEMENT WITH ULTRASOUND;  Surgeon: Ileana Roup, MD;  [facility];  Service: General;  Laterality: N/A;  FLURO    There were no vitals filed for this visit.  Subjective Assessment - 11/17/18 0932    Subjective  I have been feeling a little sore in my abdomen.  I haven't added any walking yet and just sticking to the basic exercises currently.    Currently in Pain?  Yes    Pain Score  4     Pain Location  Abdomen    Pain Orientation  Lower    Pain Descriptors / Indicators  Sore    Pain Type  Chronic pain    Pain Onset  More than a month ago    Pain Frequency  Constant    Aggravating Factors   not sure    Multiple Pain Sites  No                       [facility] Adult PT Treatment/Exercise - 11/17/18 0001      Lumbar Exercises: Aerobic   Elliptical  L1 x 4 min - pelvic floor bracing for endurance - 4 min    Nustep  seat/UE 12/13; L3  x 8 min    PT present for status update     Lumbar Exercises: Standing  Row  Strengthening;Power tower;Both;10 reps   3 sets ; 35#   Shoulder Extension  Strengthening;Power Tower;Both;10 reps   3 sets; 30#   Other Standing Lumbar Exercises  pallof punch - 35# - 15x each way    Other Standing Lumbar Exercises  pallof squat - 35# - 10x each way      Lumbar Exercises: Supine   Bridge  20 reps   on red ball   Straight Leg Raise  20 reps    Straight Leg Raises Limitations  2#    Large Ball Abdominal Isometric Limitations  ball roll out - red ball    Other Supine Lumbar Exercises  bent knee raise with UE flexion using red pball - 20x      Lumbar Exercises: Sidelying   Hip Abduction  Right;Left;20 reps    Hip Abduction Weights (lbs)  2    Other Sidelying Lumbar Exercises  hip adduction bilat - 2 lb - 20x      Lumbar Exercises: Quadruped   Straight Leg Raises Limitations  donkey kicks - 10x each no weight   on forearms              PT Short Term Goals - 10/13/18 0820       PT SHORT TERM GOAL #1   Title  Pt will report 25% less abdominal pain with sitting    Baseline  45-50% better    Status  Achieved      PT SHORT TERM GOAL #2   Title  ind with intital HEP    Baseline  initial HEP - stretches doing every day    Status  Achieved        PT Long Term Goals - 11/14/18 0846      PT LONG TERM GOAL #1   Title  Pt will report 60% less pain when sitting for work due to improved core strength and posture    Status  On-going      PT LONG TERM GOAL #2   Title  Pt will demonstrate 5/5 MMT hip abduction for improved gait with return to walking routine    Status  On-going      PT LONG TERM GOAL #5   Title  Pt will report ability to have complete bowel movement without excessive bearing down due to improved muscle strength and coordination    Baseline  more today, but overall better    Status  On-going            Plan - 11/17/18 1014    Clinical Impression Statement  Pt tolerated exercises well.  He was having more soreness in abdomen today.  No increased pain during session today.  Pt demonstrates improved technique and form with exercises with minimal cues today.  He is encouraged to start walking program for initiating transition to home program.    Comorbidities  hx of rectal cancer, chemo, radiation, rectal surgery    PT Treatment/Interventions  ADLs/Self Care Home Management;Biofeedback;Cryotherapy;Electrical Stimulation;Moist Heat;Neuromuscular re-education;Therapeutic activities;Therapeutic exercise;Manual techniques;Patient/family education;Dry needling;Taping;Passive range of motion    PT Next Visit Plan  progress strengthening, posture and core    PT Home Exercise Plan  Access Code: [email or web address]    Consulted and Agree with Plan of Care  Patient       Patient will benefit from skilled therapeutic intervention in order to improve the following deficits and impairments:  Decreased activity tolerance, Decreased strength, Pain, Increased fascial  restricitons, Impaired flexibility, Decreased coordination  Visit Diagnosis: Unspecified  lack of coordination  Muscle weakness (generalized)     Problem List Patient Active Problem List   Diagnosis Date Noted  . Insulin-requiring or dependent type II diabetes mellitus ([place])   . Hyperlipidemia   . Neuropathy   . Genetic testing 08/27/2017  . Family history of colon cancer   . Family history of uterine cancer   . Family history of stomach cancer   . Family history of prostate cancer   . Port-A-Cath in place 06/20/2017  . Rectal cancer s/p robotic LAR rectosigmoid resection 01/17/2018 06/12/2017  . Other and unspecified hyperlipidemia 06/19/2013    Jule Ser, PT 11/17/2018, 10:17 AM  [facility] [address] [place], [place], [address] Phone: [phone]   Fax:  [phone]  Name: Cleave Shroff MRN: [id number] Date of Birth: 25-Aug-1965

## 2018-11-20 ENCOUNTER — Ambulatory Visit: Payer: 59 | Admitting: Physical Therapy

## 2018-11-20 ENCOUNTER — Other Ambulatory Visit: Payer: Self-pay

## 2018-11-20 DIAGNOSIS — M6281 Muscle weakness (generalized): Secondary | ICD-10-CM | POA: Diagnosis not present

## 2018-11-20 DIAGNOSIS — R279 Unspecified lack of coordination: Secondary | ICD-10-CM

## 2018-11-20 NOTE — Therapy (Signed)
[facility] [address] [place], [place], [address] Phone: [phone]   Fax:  [phone]  Physical Therapy Treatment  Patient Details  Name: Charles [name] MRN: [id number] Date of Birth: 01/13/1965 Referring Provider (PT): Ladell Pier, MD   Encounter Date: 11/20/2018  PT End of Session - 11/20/18 0819    Visit Number  16    Date for PT Re-Evaluation  12/22/18    PT Start Time  0801    PT Stop Time  0840    PT Time Calculation (min)  39 min    Activity Tolerance  Patient tolerated treatment well    Behavior During Therapy  [facility] for tasks assessed/performed       Past Medical History:  Diagnosis Date  . Diabetes mellitus without complication ([place])    Type II  . Family history of colon cancer   . Family history of prostate cancer   . Family history of stomach cancer   . Family history of uterine cancer   . History of chemotherapy LAST CHEMO NOV 2019  . Hyperlipidemia   . Neuropathy    FEET AND HANDS PAIN IN LEGS AT TIMES    Past Surgical History:  Procedure Laterality Date  . ACHILLES TENDON REPAIR Right 2005  . APPENDECTOMY    . CYSTOSCOPY WITH STENT PLACEMENT Bilateral 01/17/2018   Procedure: CYSTOSCOPY WITH STENT PLACEMENT;  Surgeon: Kathie Rhodes, MD;  [facility];  Service: Urology;  Laterality: Bilateral;  . FLEXIBLE SIGMOIDOSCOPY N/A 01/17/2018   Procedure: FLEXIBLE SIGMOIDOSCOPY;  Surgeon: Ileana Roup, MD;  [facility];  Service: General;  Laterality: N/A;  . HERNIA REPAIR     as a baby  . ILEOSTOMY N/A 01/17/2018   Procedure: possible diverting LOOP ILEOSTOMY;  Surgeon: Ileana Roup, MD;  [facility];  Service: General;  Laterality: N/A;  . PORT-A-CATH REMOVAL Right 02/17/2018   Procedure: REMOVAL OF PORT-A-CATH;  Surgeon: Ileana Roup, MD;  [facility];  Service: General;  Laterality: Right;  . PORTACATH PLACEMENT N/A 06/17/2017   Procedure: RIGHT  VS LEFT INTERNAL JUGULAR PORT-A-CATH PLACEMENT WITH ULTRASOUND;  Surgeon: Ileana Roup, MD;  [facility];  Service: General;  Laterality: N/A;  FLURO    There were no vitals filed for this visit.  Subjective Assessment - 11/20/18 0824    Subjective  Not bad today.  Just a little sore in the abdomen. Overall I feel at least 50% better    Patient Stated Goals  get bathroom more consistent    Currently in Pain?  Yes    Pain Score  3     Pain Location  Abdomen    Pain Descriptors / Indicators  Sore    Multiple Pain Sites  No         [facility] PT Assessment - 11/20/18 0001      Strength   Overall Strength Comments  hip abduction 5/5 bilateral      Standardized Balance Assessment   Standardized Balance Assessment  Five Times Sit to Stand    Five times sit to stand comments   9 sec no UE support needed                   [facility] Adult PT Treatment/Exercise - 11/20/18 0001      Neuro Re-ed    Neuro Re-ed Details   cues on when to brace and engage pelvic floor      Lumbar  Exercises: Stretches   Active Hamstring Stretch  3 reps;30 seconds      Lumbar Exercises: Aerobic   Nustep  seat/UE 12/13; L3  x 8 min    PT present for status update     Lumbar Exercises: Standing   Row  Strengthening;Power tower;Both;10 reps   3 sets ; 35#   Shoulder Extension  Strengthening;Power Tower;Both;10 reps   3 sets; 35#   Other Standing Lumbar Exercises  half kneel and full kneel - rotation and flexion with yellow weighted ball      Lumbar Exercises: Supine   Straight Leg Raise  20 reps    Straight Leg Raises Limitations  2#      Lumbar Exercises: Sidelying   Hip Abduction  Right;Left;20 reps    Hip Abduction Weights (lbs)  2    Other Sidelying Lumbar Exercises  hip adduction bilat - 2 lb - 20x      Knee/Hip Exercises: Standing   Lateral Step Up  Right;Left;Hand Hold: 0;20 reps;Step Height: 6"    Forward Step Up  Right;Left;Step Height: 6";Hand Hold: 0;15 reps                PT Short Term Goals - 10/13/18 0820      PT SHORT TERM GOAL #1   Title  Pt will report 25% less abdominal pain with sitting    Baseline  45-50% better    Status  Achieved      PT SHORT TERM GOAL #2   Title  ind with intital HEP    Baseline  initial HEP - stretches doing every day    Status  Achieved        PT Long Term Goals - 11/20/18 0827      PT LONG TERM GOAL #1   Title  Pt will report 60% less pain when sitting for work due to improved core strength and posture    Baseline  at least 50% improved    Status  Partially Met      PT LONG TERM GOAL #2   Title  Pt will demonstrate 5/5 MMT hip abduction for improved gait with return to walking routine    Baseline  hip abduction 5/5 MMT    Status  Achieved      PT LONG TERM GOAL #4   Title  pt will demonstrate 5 times sit to stand 10 seconds or less due to improved and maintained strength with exercise program    Baseline  9 sec    Status  Achieved      PT LONG TERM GOAL #5   Title  Pt will report ability to have complete bowel movement without excessive bearing down due to improved muscle strength and coordination    Baseline  getting better, still some straining at times and only going 3x/ day    Status  Partially Met            Plan - 11/20/18 0841    Clinical Impression Statement  Pt has met several goals today including hip strength and 5x sit to stand.  He is close to meeting all other goals.  Pt is recommended to get a desk that can transition to standing for health benefits provided.  He was given a letter of recommendation to provide to his empoyerPt will benefit from skilled PT to finalize HEP and ensure maximum function with successful transition to HEP    PT Treatment/Interventions  ADLs/Self Care Home Management;Biofeedback;Cryotherapy;Electrical Stimulation;Moist Heat;Neuromuscular re-education;Therapeutic activities;Therapeutic  exercise;Manual techniques;Patient/family education;Dry  needling;Taping;Passive range of motion    PT Next Visit Plan  biofeedback to assess pelvic floor strength and endurance, review HEP and adjust for any remaining goals so  patient will continue to progress    PT Home Exercise Plan  Access Code: [email or web address]    Consulted and Agree with Plan of Care  Patient       Patient will benefit from skilled therapeutic intervention in order to improve the following deficits and impairments:  Decreased activity tolerance, Decreased strength, Pain, Increased fascial restricitons, Impaired flexibility, Decreased coordination  Visit Diagnosis: Unspecified lack of coordination  Muscle weakness (generalized)     Problem List Patient Active Problem List   Diagnosis Date Noted  . Insulin-requiring or dependent type II diabetes mellitus ([place])   . Hyperlipidemia   . Neuropathy   . Genetic testing 08/27/2017  . Family history of colon cancer   . Family history of uterine cancer   . Family history of stomach cancer   . Family history of prostate cancer   . Port-A-Cath in place 06/20/2017  . Rectal cancer s/p robotic LAR rectosigmoid resection 01/17/2018 06/12/2017  . Other and unspecified hyperlipidemia 06/19/2013    Charles [name], PT 11/20/2018, 9:06 AM  [facility] [address] [place], [place], [address] Phone: [phone]   Fax:  [phone]  Name: Charles [name] MRN: [id number] Date of Birth: 30-Oct-1965

## 2018-11-24 ENCOUNTER — Encounter: Payer: Self-pay | Admitting: Physical Therapy

## 2018-11-24 ENCOUNTER — Other Ambulatory Visit: Payer: Self-pay

## 2018-11-24 ENCOUNTER — Ambulatory Visit: Payer: 59 | Admitting: Physical Therapy

## 2018-11-24 DIAGNOSIS — M6281 Muscle weakness (generalized): Secondary | ICD-10-CM | POA: Diagnosis not present

## 2018-11-24 DIAGNOSIS — R279 Unspecified lack of coordination: Secondary | ICD-10-CM

## 2018-11-24 NOTE — Therapy (Signed)
[facility] [address], [place], [place], [address] Phone: [phone]   Fax:  [phone]  Physical Therapy Treatment  Patient Details  Name: Charles [name] MRN: [id number] Date of Birth: 10-13-1965 Referring Provider (PT): Ladell Pier, MD   Encounter Date: 11/24/2018  PT End of Session - 11/24/18 0808    Visit Number  17    Date for PT Re-Evaluation  12/22/18    PT Start Time  0802    PT Stop Time  0842    PT Time Calculation (min)  40 min    Activity Tolerance  Patient tolerated treatment well    Behavior During Therapy  [facility] for tasks assessed/performed       Past Medical History:  Diagnosis Date  . Diabetes mellitus without complication ([place])    Type II  . Family history of colon cancer   . Family history of prostate cancer   . Family history of stomach cancer   . Family history of uterine cancer   . History of chemotherapy LAST CHEMO NOV 2019  . Hyperlipidemia   . Neuropathy    FEET AND HANDS PAIN IN LEGS AT TIMES    Past Surgical History:  Procedure Laterality Date  . ACHILLES TENDON REPAIR Right 2005  . APPENDECTOMY    . CYSTOSCOPY WITH STENT PLACEMENT Bilateral 01/17/2018   Procedure: CYSTOSCOPY WITH STENT PLACEMENT;  Surgeon: Kathie Rhodes, MD;  [facility]: [facility];  Service: Urology;  Laterality: Bilateral;  . FLEXIBLE SIGMOIDOSCOPY N/A 01/17/2018   Procedure: FLEXIBLE SIGMOIDOSCOPY;  Surgeon: Ileana Roup, MD;  [facility];  Service: General;  Laterality: N/A;  . HERNIA REPAIR     as a baby  . ILEOSTOMY N/A 01/17/2018   Procedure: possible diverting LOOP ILEOSTOMY;  Surgeon: Ileana Roup, MD;  [facility];  Service: General;  Laterality: N/A;  . PORT-A-CATH REMOVAL Right 02/17/2018   Procedure: REMOVAL OF PORT-A-CATH;  Surgeon: Ileana Roup, MD;  [facility];  Service: General;  Laterality: Right;  . PORTACATH PLACEMENT N/A 06/17/2017   Procedure: RIGHT  VS LEFT INTERNAL JUGULAR PORT-A-CATH PLACEMENT WITH ULTRASOUND;  Surgeon: Ileana Roup, MD;  [facility];  Service: General;  Laterality: N/A;  FLURO    There were no vitals filed for this visit.  Subjective Assessment - 11/24/18 0806    Subjective  Pt states he did the holding pelvic floor longer for endurance reps.  He was able to do 3 reps of 15 sec holds.  States he is not in any pain.    Patient Stated Goals  get bathroom more consistent    Currently in Pain?  No/denies                       [facility] Adult PT Treatment/Exercise - 11/24/18 0001      Lumbar Exercises: Aerobic   Nustep  seat/UE 12/13; L3  x 8 min    PT present for status update     Lumbar Exercises: Standing   Shoulder Extension  Strengthening;Power Tower;Both;10 reps   3 sets; 35#   Other Standing Lumbar Exercises  sports cord - back/fwd with isometric row 35#, side step 20# - 10 reps each way   cue to not lock out the left knee   Other Standing Lumbar Exercises  pallof squat - 20# - 10x each way      Lumbar Exercises: Supine   Clam  15 reps  single leg green band   Bridge  5 reps   2 sets - 10 sec hold   Bridge Limitations  one set ball squeeze one set hip abduction green band      Lumbar Exercises: Sidelying   Hip Abduction  Right;Left;20 reps    Hip Abduction Weights (lbs)  2    Other Sidelying Lumbar Exercises  hip adduction bilat - 2 lb - 20x       hamstring stretch - 3 x 10 sec bilat        PT Short Term Goals - 10/13/18 0820      PT SHORT TERM GOAL #1   Title  Pt will report 25% less abdominal pain with sitting    Baseline  45-50% better    Status  Achieved      PT SHORT TERM GOAL #2   Title  ind with intital HEP    Baseline  initial HEP - stretches doing every day    Status  Achieved        PT Long Term Goals - 11/24/18 0825      PT LONG TERM GOAL #1   Title  Pt will report 60% less pain when sitting for work due to improved core strength and  posture    Status  Partially Met      PT LONG TERM GOAL #2   Title  Pt will demonstrate 5/5 MMT hip abduction for improved gait with return to walking routine    Baseline  hip abduction 5/5 MMT    Status  Achieved      PT LONG TERM GOAL #4   Title  pt will demonstrate 5 times sit to stand 10 seconds or less due to improved and maintained strength with exercise program    Baseline  9 sec    Status  Achieved      PT LONG TERM GOAL #5   Title  Pt will report ability to have complete bowel movement without excessive bearing down due to improved muscle strength and coordination    Status  Partially Met            Plan - 11/24/18 0816    Clinical Impression Statement  Pt was able to increase endurance of pelvic floor strength demonstrated by ability to engage pelvic floor and hold for 15 seconds.  Pt continues to have mild abdominal symtpoms but has been maintaining his symptom at a lower intensity and frequency.  Pt will benefit from skilled PT to finalize HEP    Comorbidities  hx of rectal cancer, chemo, radiation, rectal surgery    PT Treatment/Interventions  ADLs/Self Care Home Management;Biofeedback;Cryotherapy;Electrical Stimulation;Moist Heat;Neuromuscular re-education;Therapeutic activities;Therapeutic exercise;Manual techniques;Patient/family education;Dry needling;Taping;Passive range of motion    PT Home Exercise Plan  Access Code: [email or web address]       Patient will benefit from skilled therapeutic intervention in order to improve the following deficits and impairments:  Decreased activity tolerance, Decreased strength, Pain, Increased fascial restricitons, Impaired flexibility, Decreased coordination  Visit Diagnosis: Unspecified lack of coordination  Muscle weakness (generalized)     Problem List Patient Active Problem List   Diagnosis Date Noted  . Insulin-requiring or dependent type II diabetes mellitus ([place])   . Hyperlipidemia   . Neuropathy   . Genetic testing  08/27/2017  . Family history of colon cancer   . Family history of uterine cancer   . Family history of stomach cancer   . Family history of prostate cancer   . Port-A-Cath  in place 06/20/2017  . Rectal cancer s/p robotic LAR rectosigmoid resection 01/17/2018 06/12/2017  . Other and unspecified hyperlipidemia 06/19/2013    Jule Ser, PT 11/24/2018, 8:39 AM  [facility] [address], [place], [place], [address] Phone: [phone]   Fax:  [phone]  Name: Dalon Reichart MRN: [id number] Date of Birth: Nov 16, 1965

## 2018-11-28 ENCOUNTER — Other Ambulatory Visit: Payer: Self-pay

## 2018-11-28 ENCOUNTER — Ambulatory Visit: Payer: 59 | Admitting: Physical Therapy

## 2018-11-28 ENCOUNTER — Encounter: Payer: Self-pay | Admitting: Physical Therapy

## 2018-11-28 DIAGNOSIS — R279 Unspecified lack of coordination: Secondary | ICD-10-CM

## 2018-11-28 DIAGNOSIS — M6281 Muscle weakness (generalized): Secondary | ICD-10-CM | POA: Diagnosis not present

## 2018-11-28 NOTE — Therapy (Signed)
[facility] [address], [place], [place], [address] Phone: [phone]   Fax:  [phone]  Physical Therapy Treatment  Patient Details  Name: Charles [name] MRN: [id number] Date of Birth: 1965/04/22 Referring Provider (PT): Ladell Pier, MD   Encounter Date: 11/28/2018  PT End of Session - 11/28/18 0806    Visit Number  18    Date for PT Re-Evaluation  12/22/18    PT Start Time  0800    PT Stop Time  0842    PT Time Calculation (min)  42 min    Activity Tolerance  Patient tolerated treatment well    Behavior During Therapy  [facility] for tasks assessed/performed       Past Medical History:  Diagnosis Date  . Diabetes mellitus without complication ([place])    Type II  . Family history of colon cancer   . Family history of prostate cancer   . Family history of stomach cancer   . Family history of uterine cancer   . History of chemotherapy LAST CHEMO NOV 2019  . Hyperlipidemia   . Neuropathy    FEET AND HANDS PAIN IN LEGS AT TIMES    Past Surgical History:  Procedure Laterality Date  . ACHILLES TENDON REPAIR Right 2005  . APPENDECTOMY    . CYSTOSCOPY WITH STENT PLACEMENT Bilateral 01/17/2018   Procedure: CYSTOSCOPY WITH STENT PLACEMENT;  Surgeon: Kathie Rhodes, MD;  [facility];  Service: Urology;  Laterality: Bilateral;  . FLEXIBLE SIGMOIDOSCOPY N/A 01/17/2018   Procedure: FLEXIBLE SIGMOIDOSCOPY;  Surgeon: Ileana Roup, MD;  [facility];  Service: General;  Laterality: N/A;  . HERNIA REPAIR     as a baby  . ILEOSTOMY N/A 01/17/2018   Procedure: possible diverting LOOP ILEOSTOMY;  Surgeon: Ileana Roup, MD;  [facility];  Service: General;  Laterality: N/A;  . PORT-A-CATH REMOVAL Right 02/17/2018   Procedure: REMOVAL OF PORT-A-CATH;  Surgeon: Ileana Roup, MD;  [facility];  Service: General;  Laterality: Right;  . PORTACATH PLACEMENT N/A 06/17/2017   Procedure: RIGHT  VS LEFT INTERNAL JUGULAR PORT-A-CATH PLACEMENT WITH ULTRASOUND;  Surgeon: Ileana Roup, MD;  [facility];  Service: General;  Laterality: N/A;  FLURO    There were no vitals filed for this visit.  Subjective Assessment - 11/28/18 0804    Subjective  Pt reports he is doing pretty good.  He will be getting a standing desk.    Currently in Pain?  No/denies                       [facility] Adult PT Treatment/Exercise - 11/28/18 0001      Lumbar Exercises: Stretches   Other Lumbar Stretch Exercise  hamstring, gastroc, hip flexor - bilat 30 sec each      Lumbar Exercises: Aerobic   Elliptical  L1 - 3 min fwd/66mn back with bracing    Nustep  seat/UE 12/13; L3  x 8 min    PT present for status update     Lumbar Exercises: Standing   Row  Strengthening;Power tower;Both;10 reps   3 sets ; 35#   Shoulder Extension  Strengthening;Power Tower;Both;10 reps   3 sets; 35#   Other Standing Lumbar Exercises  sliders side and back - 10x each way      Lumbar Exercises: Supine   Bridge  5 reps   15 sec hold   Bridge with clamshell  10  reps    Straight Leg Raise  20 reps    Straight Leg Raises Limitations  2#      Lumbar Exercises: Sidelying   Hip Abduction  Right;Left;20 reps    Hip Abduction Weights (lbs)  2    Other Sidelying Lumbar Exercises  hip adduction bilat - 2 lb - 20x               PT Short Term Goals - 10/13/18 0820      PT SHORT TERM GOAL #1   Title  Pt will report 25% less abdominal pain with sitting    Baseline  45-50% better    Status  Achieved      PT SHORT TERM GOAL #2   Title  ind with intital HEP    Baseline  initial HEP - stretches doing every day    Status  Achieved        PT Long Term Goals - 11/28/18 3785      PT LONG TERM GOAL #1   Title  Pt will report 60% less pain when sitting for work due to improved core strength and posture    Baseline  at least 50-60% improved    Status  Achieved      PT LONG TERM GOAL #2    Title  Pt will demonstrate 5/5 MMT hip abduction for improved gait with return to walking routine    Status  Achieved      PT LONG TERM GOAL #3   Title  pt will demonstrate at least 20 mV contraction of pelvic floor and able to hold 10 sec for 5 reps    Baseline  can hold for 15 seconds for at least 5 reps    Status  Achieved      PT LONG TERM GOAL #4   Title  pt will demonstrate 5 times sit to stand 10 seconds or less due to improved and maintained strength with exercise program    Baseline  9 sec    Status  Achieved      PT LONG TERM GOAL #5   Title  Pt will report ability to have complete bowel movement without excessive bearing down due to improved muscle strength and coordination    Baseline  getting better, still some straining at times and only going 3x/ day    Status  Partially Met            Plan - 11/28/18 0836    Clinical Impression Statement  Pt is independent with HEP and has no remaining concerns. Pt has been educated on the importance of continueing with his exercises at home.  He met most of his goals and will be discharged with HEP today    PT Treatment/Interventions  ADLs/Self Care Home Management;Biofeedback;Cryotherapy;Electrical Stimulation;Moist Heat;Neuromuscular re-education;Therapeutic activities;Therapeutic exercise;Manual techniques;Patient/family education;Dry needling;Taping;Passive range of motion    PT Next Visit Plan  dc today    PT Home Exercise Plan  Access Code: [email or web address]    Consulted and Agree with Plan of Care  Patient       Patient will benefit from skilled therapeutic intervention in order to improve the following deficits and impairments:  Decreased activity tolerance, Decreased strength, Pain, Increased fascial restricitons, Impaired flexibility, Decreased coordination  Visit Diagnosis: Unspecified lack of coordination  Muscle weakness (generalized)     Problem List Patient Active Problem List   Diagnosis Date Noted  .  Insulin-requiring or dependent type II diabetes mellitus ([place])   . Hyperlipidemia   .  Neuropathy   . Genetic testing 08/27/2017  . Family history of colon cancer   . Family history of uterine cancer   . Family history of stomach cancer   . Family history of prostate cancer   . Port-A-Cath in place 06/20/2017  . Rectal cancer s/p robotic LAR rectosigmoid resection 01/17/2018 06/12/2017  . Other and unspecified hyperlipidemia 06/19/2013    Jule Ser, PT 11/28/2018, 8:40 AM  [facility] [address], [place], [place], [address] Phone: [phone]   Fax:  [phone]  Name: Charles [name] MRN: [id number] Date of Birth: May 13, 1965

## 2019-01-01 ENCOUNTER — Ambulatory Visit: Payer: 59 | Attending: Family Medicine | Admitting: Physical Therapy

## 2019-01-01 ENCOUNTER — Encounter: Payer: Self-pay | Admitting: Physical Therapy

## 2019-01-01 ENCOUNTER — Other Ambulatory Visit: Payer: Self-pay

## 2019-01-01 DIAGNOSIS — R279 Unspecified lack of coordination: Secondary | ICD-10-CM | POA: Diagnosis present

## 2019-01-01 DIAGNOSIS — R2689 Other abnormalities of gait and mobility: Secondary | ICD-10-CM | POA: Insufficient documentation

## 2019-01-01 DIAGNOSIS — M6281 Muscle weakness (generalized): Secondary | ICD-10-CM | POA: Insufficient documentation

## 2019-01-01 DIAGNOSIS — M25572 Pain in left ankle and joints of left foot: Secondary | ICD-10-CM

## 2019-01-01 NOTE — Therapy (Signed)
[facility] [address], [place], [place], [address] Phone: [phone]   Fax:  [phone]  Physical Therapy Evaluation  Patient Details  Name: Charles [name] MRN: [id number] Date of Birth: Dec 05, 1965 Referring Provider (PT): Frederik Pear, MD   Encounter Date: 01/01/2019  PT End of Session - 01/01/19 1816    Visit Number  1    Number of Visits  8    Date for PT Re-Evaluation  02/12/19    Authorization Type  [employer]    PT Start Time  1130    PT Stop Time  1217    PT Time Calculation (min)  47 min    Activity Tolerance  Patient tolerated treatment well    Behavior During Therapy  [facility] for tasks assessed/performed       Past Medical History:  Diagnosis Date  . Diabetes mellitus without complication ([place])    Type II  . Family history of colon cancer   . Family history of prostate cancer   . Family history of stomach cancer   . Family history of uterine cancer   . History of chemotherapy LAST CHEMO NOV 2019  . Hyperlipidemia   . Neuropathy    FEET AND HANDS PAIN IN LEGS AT TIMES    Past Surgical History:  Procedure Laterality Date  . ACHILLES TENDON REPAIR Right 2005  . APPENDECTOMY    . CYSTOSCOPY WITH STENT PLACEMENT Bilateral 01/17/2018   Procedure: CYSTOSCOPY WITH STENT PLACEMENT;  Surgeon: Kathie Rhodes, MD;  [facility];  Service: Urology;  Laterality: Bilateral;  . FLEXIBLE SIGMOIDOSCOPY N/A 01/17/2018   Procedure: FLEXIBLE SIGMOIDOSCOPY;  Surgeon: Ileana Roup, MD;  [facility];  Service: General;  Laterality: N/A;  . HERNIA REPAIR     as a baby  . ILEOSTOMY N/A 01/17/2018   Procedure: possible diverting LOOP ILEOSTOMY;  Surgeon: Ileana Roup, MD;  [facility];  Service: General;  Laterality: N/A;  . PORT-A-CATH REMOVAL Right 02/17/2018   Procedure: REMOVAL OF PORT-A-CATH;  Surgeon: Ileana Roup, MD;  [facility];  Service: General;  Laterality: Right;  .  PORTACATH PLACEMENT N/A 06/17/2017   Procedure: RIGHT VS LEFT INTERNAL JUGULAR PORT-A-CATH PLACEMENT WITH ULTRASOUND;  Surgeon: Ileana Roup, MD;  [facility];  Service: General;  Laterality: N/A;  FLURO    There were no vitals filed for this visit.   Subjective Assessment - 01/01/19 1749    Subjective  He relays Lt heel pain for 2 week onset maybe due to going up and down a lot of stairs moving boxes. His pain is overall 7/10 on avg and throbbing causing him to limp. He saw Ortho and was diagnosed with achilles tendonitis and referred to PT.    Pertinent History  hx of rectal cancer, chemo, radiation, rectal surgery, Rt achillies repair 2005,DM    Limitations  Walking;Standing    How long can you stand comfortably?  depends    How long can you walk comfortably?  depends    Diagnostic tests  no recent imaging for his Lt ankle/foot    Patient Stated Goals  be able to walk without limping and get back to normal.    Currently in Pain?  Yes    Pain Score  7     Pain Location  Ankle    Pain Orientation  Left    Pain Descriptors / Indicators  Throbbing    Pain Type  Acute pain  Pain Radiating Towards  denies    Pain Onset  1 to 4 weeks ago    Pain Frequency  Intermittent    Aggravating Factors   walking, stairs    Pain Relieving Factors  ice    Multiple Pain Sites  No         [facility] PT Assessment - 01/01/19 0001      Assessment   Medical Diagnosis  Lt achillies tendonitis    Referring Provider (PT)  Frederik Pear, MD    Onset Date/Surgical Date  --   2 week onset of pain   Next MD Visit  PRN    Prior Therapy  PT for abdominal pain earlier this year      Precautions   Precautions  None      Balance Screen   Has the patient fallen in the past 6 months  No      [place] residence    Living Arrangements  Spouse/significant other      Prior Function   Level of Independence  Independent      Cognition   Overall Cognitive  Status  Within Functional Limits for tasks assessed      Sensation   Light Touch  Appears Intact      Coordination   Gross Motor Movements are Fluid and Coordinated  Yes      ROM / Strength   AROM / PROM / Strength  AROM;Strength      AROM   AROM Assessment Site  Ankle    Right/Left Ankle  Left    Left Ankle Dorsiflexion  5    Left Ankle Plantar Flexion  --   WNL   Left Ankle Inversion  --   WNL   Left Ankle Eversion  --   WNL     Strength   Overall Strength Comments  Rt ankle strength 5/5 MMT except plantar flexion 4/5 measured in standing (was 5/5 measured in supine)      Flexibility   Soft Tissue Assessment /Muscle Length  --   mild tighntess in Lt gastroc-soleus     Palpation   Palpation comment  TTP over Lt achillies tendon with thickening of tendon noted      Transfers   Transfers  Independent with all Transfers      Ambulation/Gait   Gait Comments  independent no AD, moderate antlalgic gait on Lt leg                Objective measurements completed on examination: See above findings.      [facility] Adult PT Treatment/Exercise - 01/01/19 0001      Exercises   Exercises  Ankle      Modalities   Modalities  Ultrasound      Ultrasound   Ultrasound Location  Lt achillies     Ultrasound Parameters  100%, 1.0w/cm2, 1.56mhz, 8 min    Ultrasound Goals  Pain      Manual Therapy   Manual therapy comments  KT tape to assist Lt achillies    Soft tissue mobilization  STM/IASTM to Lt achillies, gastroc-soleus      Ankle Exercises: Stretches   Soleus Stretch  30 seconds;2 reps    Soleus Stretch Limitations  standing    Gastroc Stretch  2 reps;30 seconds    Gastroc Stretch Limitations  standing      Ankle Exercises: Seated   Heel Raises  10 reps    Heel Raises Limitations  eccentric lower for 5 sec using hands to push against his knee for resistance    Other Seated Ankle Exercises  seated plantarflexion eccentric with slow 5 sec eccentric lower using  black band X 10 reps             PT Education - 01/01/19 1815    Education Details  HEP, POC, KT tape instructions, rationale for heel lift to offload achillies temporarily    Person(s) Educated  Patient    Methods  Explanation;Verbal cues;Handout;Demonstration    Comprehension  Verbalized understanding;Returned demonstration       PT Short Term Goals - 01/01/19 1823      PT SHORT TERM GOAL #1   Title  Pt will report 25% less overall heel pain.    Baseline  7/10    Time  4    Period  Weeks    Status  New    Target Date  01/29/19        PT Long Term Goals - 01/01/19 1824      PT LONG TERM GOAL #1   Title  Pt will report overall less than 2-3/10 pain with ususal activity including community ambulation and stairs.    Baseline  7/10 pain    Time  6    Period  Weeks    Status  New    Target Date  02/12/19      PT LONG TERM GOAL #2   Title  Pt will demonstrate 4+/5 MMT for plantarflexion in standing (able to perform at least 2 standing heel raises)    Baseline  4/5    Time  6    Period  Weeks    Status  New    Target Date  02/12/19      PT LONG TERM GOAL #3   Title  Pt will be I and compliant with HEP.    Baseline  no HEP until today    Time  6    Period  Weeks    Status  New    Target Date  02/12/19      PT LONG TERM GOAL #4   Title  Pt will demonstrate WNL gait pattern    Baseline  antalgic gait on Lt leg    Time  6    Period  Weeks    Status  New    Target Date  02/12/19             Plan - 01/01/19 1818    Clinical Impression Statement  Pt presents with signs and symptoms consistent of Lt achillies tendonitis . He has overall decreased ankle DF ROM, decreased ankle platarflexion strength, decreased activity tolerance particularly with standing or walking or stairs, and increased pain limiting his functional abilities. He will benefit from skilled PT to address his deficits.    Personal Factors and Comorbidities  Comorbidity 3+    Comorbidities   hx of rectal cancer, chemo, radiation, rectal surgery, Rt achillies repair 2005,DM    Examination-Activity Limitations  Stairs;Stand;Locomotion Level    Examination-Participation Restrictions  Community Activity;Yard Work;Shop    Stability/Clinical Decision Making  Evolving/Moderate complexity    Clinical Decision Making  Moderate    Rehab Potential  Excellent    PT Frequency  2x / week   1-2   PT Duration  6 weeks    PT Treatment/Interventions  ADLs/Self Care Home Management;Biofeedback;Cryotherapy;Electrical Stimulation;Moist Heat;Neuromuscular re-education;Therapeutic activities;Therapeutic exercise;Manual techniques;Patient/family education;Dry needling;Taping;Passive range of motion;Iontophoresis 4mg /ml Dexamethasone;Ultrasound;Gait training  PT Next Visit Plan  review and update HEP PRN, he has responded well to U.S in the past so consider U.S or ionto to reduce pain and inflammation. Needs stretching for heel cord and plantar flexion eccentric stregthening.    PT Home Exercise Plan  Access Code: [email or web address]    Consulted and Agree with Plan of Care  Patient       Patient will benefit from skilled therapeutic intervention in order to improve the following deficits and impairments:  Decreased activity tolerance, Decreased strength, Pain, Increased fascial restricitons, Impaired flexibility, Decreased coordination, Difficulty walking  Visit Diagnosis: Pain in left ankle and joints of left foot  Muscle weakness (generalized)  Other abnormalities of gait and mobility     Problem List Patient Active Problem List   Diagnosis Date Noted  . Insulin-requiring or dependent type II diabetes mellitus ([place])   . Hyperlipidemia   . Neuropathy   . Genetic testing 08/27/2017  . Family history of colon cancer   . Family history of uterine cancer   . Family history of stomach cancer   . Family history of prostate cancer   . Port-A-Cath in place 06/20/2017  . Rectal cancer s/p robotic LAR  rectosigmoid resection 01/17/2018 06/12/2017  . Other and unspecified hyperlipidemia 06/19/2013    Silvestre Mesi 01/01/2019, 6:33 PM  [facility] [address], [place], [place], [address] Phone: [phone]   Fax:  [phone]  Name: Charles [name] MRN: [id number] Date of Birth: 12-14-1965

## 2019-01-01 NOTE — Patient Instructions (Signed)
Access Code: [email or web address]  URL: [email or web address]  Date: 01/01/2019  Prepared by: Elsie Ra   Exercises  Gastroc Stretch on Wall - 3 reps - 1 sets - 30 hold - 1x daily - 3x weekly  Soleus Stretch on Wall - 3 sets - 30 hold - 2x daily - 6x weekly  Seated Eccentric Ankle Plantar Flexion with Resistance - Straight Leg - 10 reps - 2-3 sets - 2x daily - 6x weekly  Seated Heel Toe Raises - 10 reps - 2-3 sets - 2x daily - 6x weekly  Calf Mobilization with Small Ball - 1 sets - 3 min total working the whole area hold - 2x daily - 6x weekly

## 2019-01-05 ENCOUNTER — Encounter: Payer: Self-pay | Admitting: Physical Therapy

## 2019-01-05 ENCOUNTER — Ambulatory Visit: Payer: 59 | Admitting: Physical Therapy

## 2019-01-05 ENCOUNTER — Other Ambulatory Visit: Payer: Self-pay

## 2019-01-05 DIAGNOSIS — R279 Unspecified lack of coordination: Secondary | ICD-10-CM

## 2019-01-05 DIAGNOSIS — R2689 Other abnormalities of gait and mobility: Secondary | ICD-10-CM

## 2019-01-05 DIAGNOSIS — M6281 Muscle weakness (generalized): Secondary | ICD-10-CM

## 2019-01-05 DIAGNOSIS — M25572 Pain in left ankle and joints of left foot: Secondary | ICD-10-CM | POA: Diagnosis not present

## 2019-01-05 NOTE — Patient Instructions (Signed)

## 2019-01-05 NOTE — Therapy (Signed)
[facility] [address] [place], [place], [address] Phone: [phone]   Fax:  [phone]  Physical Therapy Treatment  Patient Details  Name: Charles [name] MRN: [id number] Date of Birth: 07/22/1965 Referring Provider (PT): Frederik Pear, MD   Encounter Date: 01/05/2019  PT End of Session - 01/05/19 1018    Visit Number  2    Number of Visits  8    Date for PT Re-Evaluation  02/12/19    Authorization Type  UHC    PT Start Time  [id number]    PT Stop Time  1052    PT Time Calculation (min)  37 min    Activity Tolerance  Patient tolerated treatment well    Behavior During Therapy  [facility] for tasks assessed/performed       Past Medical History:  Diagnosis Date  . Diabetes mellitus without complication ([place])    Type II  . Family history of colon cancer   . Family history of prostate cancer   . Family history of stomach cancer   . Family history of uterine cancer   . History of chemotherapy LAST CHEMO NOV 2019  . Hyperlipidemia   . Neuropathy    FEET AND HANDS PAIN IN LEGS AT TIMES    Past Surgical History:  Procedure Laterality Date  . ACHILLES TENDON REPAIR Right 2005  . APPENDECTOMY    . CYSTOSCOPY WITH STENT PLACEMENT Bilateral 01/17/2018   Procedure: CYSTOSCOPY WITH STENT PLACEMENT;  Surgeon: Kathie Rhodes, MD;  Location: [facility];  Service: Urology;  Laterality: Bilateral;  . FLEXIBLE SIGMOIDOSCOPY N/A 01/17/2018   Procedure: FLEXIBLE SIGMOIDOSCOPY;  Surgeon: Ileana Roup, MD;  [facility]: [facility];  Service: General;  Laterality: N/A;  . HERNIA REPAIR     as a baby  . ILEOSTOMY N/A 01/17/2018   Procedure: possible diverting LOOP ILEOSTOMY;  Surgeon: Ileana Roup, MD;  [facility];  Service: General;  Laterality: N/A;  . PORT-A-CATH REMOVAL Right 02/17/2018   Procedure: REMOVAL OF PORT-A-CATH;  Surgeon: Ileana Roup, MD;  [facility];  Service: General;  Laterality: Right;  .  PORTACATH PLACEMENT N/A 06/17/2017   Procedure: RIGHT VS LEFT INTERNAL JUGULAR PORT-A-CATH PLACEMENT WITH ULTRASOUND;  Surgeon: Ileana Roup, MD;  [facility];  Service: General;  Laterality: N/A;  FLURO    There were no vitals filed for this visit.  Subjective Assessment - 01/05/19 1019    Subjective  This morning it has felt the best it has since last week. I still have  alittle limp and it is tender to the touch    Currently in Pain?  No/denies                       [facility] Adult PT Treatment/Exercise - 01/05/19 0001      Modalities   Modalities  Iontophoresis      Iontophoresis   Type of Iontophoresis  Dexamethasone    Location  left achilles    Dose  1.0 mL - #1    Time  4-6 hour release      Manual Therapy   Soft tissue mobilization  achilles, gastroc, soleus - LEFT only      Ankle Exercises: Stretches   Soleus Stretch  30 seconds;2 reps    Soleus Stretch Limitations  standing    Gastroc Stretch  2 reps;30 seconds    Gastroc Stretch Limitations  standing      Ankle  Exercises: Standing   Heel Raises  Both;20 reps;Limitations    Heel Raises Limitations  up on two, weight shift to left and slow lowering    Other Standing Ankle Exercises  rocker board - 30 toe to heel - UE use minimal               PT Short Term Goals - 01/05/19 1020      PT SHORT TERM GOAL #1   Title  Pt will report 25% less overall heel pain.    Status  On-going      PT SHORT TERM GOAL #2   Title  ...    Baseline  ...        PT Long Term Goals - 01/01/19 1824      PT LONG TERM GOAL #1   Title  Pt will report overall less than 2-3/10 pain with ususal activity including community ambulation and stairs.    Baseline  7/10 pain    Time  6    Period  Weeks    Status  New    Target Date  02/12/19      PT LONG TERM GOAL #2   Title  Pt will demonstrate 4+/5 MMT for plantarflexion in standing (able to perform at least 2 standing heel raises)    Baseline  4/5     Time  6    Period  Weeks    Status  New    Target Date  02/12/19      PT LONG TERM GOAL #3   Title  Pt will be I and compliant with HEP.    Baseline  no HEP until today    Time  6    Period  Weeks    Status  New    Target Date  02/12/19      PT LONG TERM GOAL #4   Title  Pt will demonstrate WNL gait pattern    Baseline  antalgic gait on Lt leg    Time  6    Period  Weeks    Status  New    Target Date  02/12/19            Plan - 01/05/19 1055    Clinical Impression Statement  Pt tolerated exercises well.  He is unable to perform single leg calf raise. Pt was monitored for pain throughout treatment.  No increase pain after treatment today.  He will benefit from skilled PT to continue to progress strength and STM.    PT Treatment/Interventions  ADLs/Self Care Home Management;Biofeedback;Cryotherapy;Electrical Stimulation;Moist Heat;Neuromuscular re-education;Therapeutic activities;Therapeutic exercise;Manual techniques;Patient/family education;Dry needling;Taping;Passive range of motion;Iontophoresis 4mg /ml Dexamethasone;Ultrasound;Gait training    PT Next Visit Plan  eccentric calf exercises, STM and ROM, ankle stability, ionto #2 if ionto helped    PT Home Exercise Plan  Access Code: [email or web address]    Consulted and Agree with Plan of Care  Patient       Patient will benefit from skilled therapeutic intervention in order to improve the following deficits and impairments:  Decreased activity tolerance, Decreased strength, Pain, Increased fascial restricitons, Impaired flexibility, Decreased coordination, Difficulty walking  Visit Diagnosis: Pain in left ankle and joints of left foot  Muscle weakness (generalized)  Other abnormalities of gait and mobility  Unspecified lack of coordination     Problem List Patient Active Problem List   Diagnosis Date Noted  . Insulin-requiring or dependent type II diabetes mellitus ([place])   . Hyperlipidemia   . Neuropathy   .  Genetic  testing 08/27/2017  . Family history of colon cancer   . Family history of uterine cancer   . Family history of stomach cancer   . Family history of prostate cancer   . Port-A-Cath in place 06/20/2017  . Rectal cancer s/p robotic LAR rectosigmoid resection 01/17/2018 06/12/2017  . Other and unspecified hyperlipidemia 06/19/2013    Jule Ser, PT 01/05/2019, 10:58 AM  [facility] [address] [place], [place], [address] Phone: [phone]   Fax:  [phone]  Name: Charles [name] MRN: [id number] Date of Birth: [birth year]

## 2019-01-06 ENCOUNTER — Ambulatory Visit: Payer: 59 | Admitting: Physical Therapy

## 2019-01-06 ENCOUNTER — Encounter: Payer: Self-pay | Admitting: Physical Therapy

## 2019-01-06 DIAGNOSIS — M6281 Muscle weakness (generalized): Secondary | ICD-10-CM

## 2019-01-06 DIAGNOSIS — R279 Unspecified lack of coordination: Secondary | ICD-10-CM

## 2019-01-06 DIAGNOSIS — R2689 Other abnormalities of gait and mobility: Secondary | ICD-10-CM

## 2019-01-06 DIAGNOSIS — M25572 Pain in left ankle and joints of left foot: Secondary | ICD-10-CM | POA: Diagnosis not present

## 2019-01-06 NOTE — Therapy (Signed)
[facility] [address] [place], [place], [address] Phone: [phone]   Fax:  [phone]  Physical Therapy Treatment  Patient Details  Name: Charles [name] MRN: [id number] Date of Birth: 02-01-1965 Referring Provider (PT): Frederik Pear, MD   Encounter Date: 01/06/2019  PT End of Session - 01/06/19 1404    Visit Number  3    Number of Visits  8    Date for PT Re-Evaluation  02/12/19    Authorization Type  UHC    PT Start Time  1400    PT Stop Time  1440    PT Time Calculation (min)  40 min    Activity Tolerance  Patient tolerated treatment well    Behavior During Therapy  [facility] for tasks assessed/performed       Past Medical History:  Diagnosis Date  . Diabetes mellitus without complication ([place])    Type II  . Family history of colon cancer   . Family history of prostate cancer   . Family history of stomach cancer   . Family history of uterine cancer   . History of chemotherapy LAST CHEMO NOV 2019  . Hyperlipidemia   . Neuropathy    FEET AND HANDS PAIN IN LEGS AT TIMES    Past Surgical History:  Procedure Laterality Date  . ACHILLES TENDON REPAIR Right 2005  . APPENDECTOMY    . CYSTOSCOPY WITH STENT PLACEMENT Bilateral 01/17/2018   Procedure: CYSTOSCOPY WITH STENT PLACEMENT;  Surgeon: Kathie Rhodes, MD;  [facility];  Service: Urology;  Laterality: Bilateral;  . FLEXIBLE SIGMOIDOSCOPY N/A 01/17/2018   Procedure: FLEXIBLE SIGMOIDOSCOPY;  Surgeon: Ileana Roup, MD;  [facility];  Service: General;  Laterality: N/A;  . HERNIA REPAIR     as a baby  . ILEOSTOMY N/A 01/17/2018   Procedure: possible diverting LOOP ILEOSTOMY;  Surgeon: Ileana Roup, MD;  [facility];  Service: General;  Laterality: N/A;  . PORT-A-CATH REMOVAL Right 02/17/2018   Procedure: REMOVAL OF PORT-A-CATH;  Surgeon: Ileana Roup, MD;  [facility];  Service: General;  Laterality: Right;  .  PORTACATH PLACEMENT N/A 06/17/2017   Procedure: RIGHT VS LEFT INTERNAL JUGULAR PORT-A-CATH PLACEMENT WITH ULTRASOUND;  Surgeon: Ileana Roup, MD;  [facility];  Service: General;  Laterality: N/A;  FLURO    There were no vitals filed for this visit.  Subjective Assessment - 01/06/19 1402    Subjective  Pt states it is about the same as yesterday, maybe a little better.  I am still limping and it feels tight.    Patient Stated Goals  be able to walk without limping and get back to normal.    Currently in Pain?  No/denies                       [facility] Adult PT Treatment/Exercise - 01/06/19 0001      Lumbar Exercises: Aerobic   Stationary Bike  L1 x 5 min   PT present for status update     Modalities   Modalities  Iontophoresis      Ultrasound   Ultrasound Location  Lt achilles    Ultrasound Parameters  100% 1.0w/c, 1 mHz    Ultrasound Goals  Pain      Iontophoresis   Type of Iontophoresis  Dexamethasone    Location  left achilles    Dose  1.0 mL - #2    Time  4-6  hour release      Manual Therapy   Soft tissue mobilization  achilles, gastroc, soleus - LEFT only      Ankle Exercises: Stretches   Soleus Stretch  30 seconds;2 reps    Soleus Stretch Limitations  standing    Gastroc Stretch  2 reps;30 seconds    Gastroc Stretch Limitations  standing      Ankle Exercises: Standing   Heel Raises  Both;20 reps;Limitations    Heel Raises Limitations  up on two, weight shift to left and slow lowering    Other Standing Ankle Exercises  rocker board - 30 toe to heel - UE use minimal               PT Short Term Goals - 01/05/19 1020      PT SHORT TERM GOAL #1   Title  Pt will report 25% less overall heel pain.    Status  On-going      PT SHORT TERM GOAL #2   Title  ...    Baseline  ...        PT Long Term Goals - 01/01/19 1824      PT LONG TERM GOAL #1   Title  Pt will report overall less than 2-3/10 pain with ususal activity including  community ambulation and stairs.    Baseline  7/10 pain    Time  6    Period  Weeks    Status  New    Target Date  02/12/19      PT LONG TERM GOAL #2   Title  Pt will demonstrate 4+/5 MMT for plantarflexion in standing (able to perform at least 2 standing heel raises)    Baseline  4/5    Time  6    Period  Weeks    Status  New    Target Date  02/12/19      PT LONG TERM GOAL #3   Title  Pt will be I and compliant with HEP.    Baseline  no HEP until today    Time  6    Period  Weeks    Status  New    Target Date  02/12/19      PT LONG TERM GOAL #4   Title  Pt will demonstrate WNL gait pattern    Baseline  antalgic gait on Lt leg    Time  6    Period  Weeks    Status  New    Target Date  02/12/19            Plan - 01/06/19 1446    Clinical Impression Statement  Pt continues to have tension with tight bands running throughout gastroc and soleus especially medially . Pt has large spot the size of a marble in medial distal achilles attachment to the calcaneus.  PT discussed dry needling and will most likely try this next session.    Comorbidities  hx of rectal cancer, chemo, radiation, rectal surgery, Rt achillies repair 2005,DM    PT Treatment/Interventions  ADLs/Self Care Home Management;Biofeedback;Cryotherapy;Electrical Stimulation;Moist Heat;Neuromuscular re-education;Therapeutic activities;Therapeutic exercise;Manual techniques;Patient/family education;Dry needling;Taping;Passive range of motion;Iontophoresis 4mg /ml Dexamethasone;Ultrasound;Gait training    PT Next Visit Plan  DN to Lt gastroc/soleus, ionto #3, progress ankle stability, SLS, eccentric calf    PT Home Exercise Plan  Access Code: [email or web address]    Consulted and Agree with Plan of Care  Patient       Patient will benefit from skilled therapeutic intervention in  order to improve the following deficits and impairments:  Decreased activity tolerance, Decreased strength, Pain, Increased fascial restricitons,  Impaired flexibility, Decreased coordination, Difficulty walking  Visit Diagnosis: Pain in left ankle and joints of left foot  Muscle weakness (generalized)  Other abnormalities of gait and mobility  Unspecified lack of coordination     Problem List Patient Active Problem List   Diagnosis Date Noted  . Insulin-requiring or dependent type II diabetes mellitus ([place])   . Hyperlipidemia   . Neuropathy   . Genetic testing 08/27/2017  . Family history of colon cancer   . Family history of uterine cancer   . Family history of stomach cancer   . Family history of prostate cancer   . Port-A-Cath in place 06/20/2017  . Rectal cancer s/p robotic LAR rectosigmoid resection 01/17/2018 06/12/2017  . Other and unspecified hyperlipidemia 06/19/2013    Jule Ser, PT 01/06/2019, 3:02 PM  [facility] [address] [place], [place], [address] Phone: [phone]   Fax:  [phone]  Name: Charles [name] MRN: [id number] Date of Birth: 15-Apr-1965

## 2019-01-06 NOTE — Patient Instructions (Signed)

## 2019-01-08 ENCOUNTER — Other Ambulatory Visit: Payer: Self-pay

## 2019-01-08 ENCOUNTER — Ambulatory Visit: Payer: 59 | Admitting: Physical Therapy

## 2019-01-08 DIAGNOSIS — R2689 Other abnormalities of gait and mobility: Secondary | ICD-10-CM

## 2019-01-08 DIAGNOSIS — M6281 Muscle weakness (generalized): Secondary | ICD-10-CM

## 2019-01-08 DIAGNOSIS — M25572 Pain in left ankle and joints of left foot: Secondary | ICD-10-CM | POA: Diagnosis not present

## 2019-01-08 DIAGNOSIS — R279 Unspecified lack of coordination: Secondary | ICD-10-CM

## 2019-01-08 NOTE — Therapy (Signed)
[facility] [address] [place], [place], [address] Phone: [phone]   Fax:  [phone]  Physical Therapy Treatment  Patient Details  Name: Charles [name] MRN: [id number] Date of Birth: Aug 05, 1965 Referring Provider (PT): Frederik Pear, MD   Encounter Date: 01/08/2019  PT End of Session - 01/08/19 1945    Visit Number  4    Number of Visits  8    Date for PT Re-Evaluation  02/12/19    Authorization Type  UHC    PT Start Time  1237    PT Stop Time  1319    PT Time Calculation (min)  42 min    Activity Tolerance  Patient tolerated treatment well    Behavior During Therapy  [facility] for tasks assessed/performed       Past Medical History:  Diagnosis Date  . Diabetes mellitus without complication ([place])    Type II  . Family history of colon cancer   . Family history of prostate cancer   . Family history of stomach cancer   . Family history of uterine cancer   . History of chemotherapy LAST CHEMO NOV 2019  . Hyperlipidemia   . Neuropathy    FEET AND HANDS PAIN IN LEGS AT TIMES    Past Surgical History:  Procedure Laterality Date  . ACHILLES TENDON REPAIR Right 2005  . APPENDECTOMY    . CYSTOSCOPY WITH STENT PLACEMENT Bilateral 01/17/2018   Procedure: CYSTOSCOPY WITH STENT PLACEMENT;  Surgeon: Kathie Rhodes, MD;  [facility];  Service: Urology;  Laterality: Bilateral;  . FLEXIBLE SIGMOIDOSCOPY N/A 01/17/2018   Procedure: FLEXIBLE SIGMOIDOSCOPY;  Surgeon: Ileana Roup, MD;  [facility];  Service: General;  Laterality: N/A;  . HERNIA REPAIR     as a baby  . ILEOSTOMY N/A 01/17/2018   Procedure: possible diverting LOOP ILEOSTOMY;  Surgeon: Ileana Roup, MD;  [facility];  Service: General;  Laterality: N/A;  . PORT-A-CATH REMOVAL Right 02/17/2018   Procedure: REMOVAL OF PORT-A-CATH;  Surgeon: Ileana Roup, MD;  [facility];  Service: General;  Laterality: Right;  .  PORTACATH PLACEMENT N/A 06/17/2017   Procedure: RIGHT VS LEFT INTERNAL JUGULAR PORT-A-CATH PLACEMENT WITH ULTRASOUND;  Surgeon: Ileana Roup, MD;  [facility];  Service: General;  Laterality: N/A;  FLURO    There were no vitals filed for this visit.  Subjective Assessment - 01/08/19 1949    Subjective  Pt feels like he is more stiff today and has more pronounced limp.    Patient Stated Goals  be able to walk without limping and get back to normal.    Currently in Pain?  Yes    Pain Score  5     Pain Location  Ankle    Pain Orientation  Left    Pain Descriptors / Indicators  Tightness    Pain Type  Acute pain    Multiple Pain Sites  No                       [facility] Adult PT Treatment/Exercise - 01/08/19 0001      Ultrasound   Ultrasound Location  Lt achilles    Ultrasound Parameters  100% 1.0w/c; 56mHz    Ultrasound Goals  Pain      Iontophoresis   Type of Iontophoresis  Dexamethasone    Location  left achilles    Dose  1.0 mL - #3  Time  4-6 hour release      Manual Therapy   Manual therapy comments  IASTM - achilles Lt    Soft tissue mobilization  achilles, gastroc, soleus - LEFT only      Ankle Exercises: Standing   Heel Raises Limitations  up on two, weight shift to left and slow lowering   off step for deeper stretch      Trigger Point Dry Needling - 01/08/19 0001    Consent Given?  Yes    Education Handout Provided  Yes    Muscles Treated Lower Quadrant  Gastrocnemius;Soleus    Gastrocnemius Response  Twitch response elicited;Palpable increased muscle length    Soleus Response  Twitch response elicited;Palpable increased muscle length             PT Short Term Goals - 01/05/19 1020      PT SHORT TERM GOAL #1   Title  Pt will report 25% less overall heel pain.    Status  On-going      PT SHORT TERM GOAL #2   Title  ...    Baseline  ...        PT Long Term Goals - 01/01/19 1824      PT LONG TERM GOAL #1   Title  Pt  will report overall less than 2-3/10 pain with ususal activity including community ambulation and stairs.    Baseline  7/10 pain    Time  6    Period  Weeks    Status  New    Target Date  02/12/19      PT LONG TERM GOAL #2   Title  Pt will demonstrate 4+/5 MMT for plantarflexion in standing (able to perform at least 2 standing heel raises)    Baseline  4/5    Time  6    Period  Weeks    Status  New    Target Date  02/12/19      PT LONG TERM GOAL #3   Title  Pt will be I and compliant with HEP.    Baseline  no HEP until today    Time  6    Period  Weeks    Status  New    Target Date  02/12/19      PT LONG TERM GOAL #4   Title  Pt will demonstrate WNL gait pattern    Baseline  antalgic gait on Lt leg    Time  6    Period  Weeks    Status  New    Target Date  02/12/19            Plan - 01/08/19 1945    Clinical Impression Statement  Pt was a little sore after treatment today and continues to have a limp.  He had improved ROM of Lt ankle dorsiflexion up 5 degrees with knee bent at the end of the session.  Pt was educated on heat and stretching throughout the rest of the day to maintain improved soft tissue length.  pt still has difficulty with calf raises and was educated on slow lowering off the edge of the step to progress ROM.    PT Treatment/Interventions  ADLs/Self Care Home Management;Biofeedback;Cryotherapy;Electrical Stimulation;Moist Heat;Neuromuscular re-education;Therapeutic activities;Therapeutic exercise;Manual techniques;Patient/family education;Dry needling;Taping;Passive range of motion;Iontophoresis 4mg /ml Dexamethasone;Ultrasound;Gait training    PT Next Visit Plan  DN to Lt gastroc/soleus, ionto #4, progress ankle stability, SLS, eccentric calf    PT Home Exercise Plan  Access Code: [email or web address]  Consulted and Agree with Plan of Care  Patient       Patient will benefit from skilled therapeutic intervention in order to improve the following deficits and  impairments:  Decreased activity tolerance, Decreased strength, Pain, Increased fascial restricitons, Impaired flexibility, Decreased coordination, Difficulty walking  Visit Diagnosis: Pain in left ankle and joints of left foot  Muscle weakness (generalized)  Other abnormalities of gait and mobility  Unspecified lack of coordination     Problem List Patient Active Problem List   Diagnosis Date Noted  . Insulin-requiring or dependent type II diabetes mellitus ([place])   . Hyperlipidemia   . Neuropathy   . Genetic testing 08/27/2017  . Family history of colon cancer   . Family history of uterine cancer   . Family history of stomach cancer   . Family history of prostate cancer   . Port-A-Cath in place 06/20/2017  . Rectal cancer s/p robotic LAR rectosigmoid resection 01/17/2018 06/12/2017  . Other and unspecified hyperlipidemia 06/19/2013    Jule Ser, PT 01/08/2019, 7:55 PM  [facility] [address], [place], [place], [address] Phone: [phone]   Fax:  [phone]  Name: Charles [name] MRN: [id number] Date of Birth: 1965/06/25

## 2019-01-13 ENCOUNTER — Encounter: Payer: Self-pay | Admitting: Physical Therapy

## 2019-01-13 ENCOUNTER — Ambulatory Visit: Payer: 59 | Attending: Orthopedic Surgery | Admitting: Physical Therapy

## 2019-01-13 ENCOUNTER — Other Ambulatory Visit: Payer: Self-pay

## 2019-01-13 DIAGNOSIS — M6281 Muscle weakness (generalized): Secondary | ICD-10-CM | POA: Diagnosis present

## 2019-01-13 DIAGNOSIS — M25572 Pain in left ankle and joints of left foot: Secondary | ICD-10-CM | POA: Diagnosis not present

## 2019-01-13 DIAGNOSIS — R279 Unspecified lack of coordination: Secondary | ICD-10-CM | POA: Diagnosis present

## 2019-01-13 DIAGNOSIS — R2689 Other abnormalities of gait and mobility: Secondary | ICD-10-CM | POA: Insufficient documentation

## 2019-01-13 NOTE — Patient Instructions (Signed)
Access Code: [email or web address]  URL: [email or web address]  Date: 01/13/2019  Prepared by: Jari Favre   Exercises Gastroc Stretch on Wall - 3 reps - 1 sets - 30 hold - 1x daily - 3x weekly Soleus Stretch on Wall - 3 sets - 30 hold - 2x daily - 6x weekly Seated Eccentric Ankle Plantar Flexion with Resistance - Straight Leg - 10 reps - 2-3 sets - 2x daily - 6x weekly Seated Heel Toe Raises - 10 reps - 2-3 sets - 2x daily - 6x weekly Calf Mobilization with Small Ball - 1 sets - 3 min total working the whole area hold - 2x daily - 6x weekly Tandem Stance - 3 reps - 1 sets - 30 hold - 1x daily - 7x weekly Single Leg Stance on Foam Pad - 3 reps - 1 sets - 30 hold - 1x daily - 7x weekly

## 2019-01-13 NOTE — Therapy (Signed)
[facility] [address] [place], [place], [address] Phone: [phone]   Fax:  [phone]  Physical Therapy Treatment  Patient Details  Name: Charles [name] MRN: [id number] Date of Birth: 12/26/1965 Referring Provider (PT): Frederik Pear, MD   Encounter Date: 01/13/2019  PT End of Session - 01/13/19 0805    Visit Number  5    Number of Visits  8    Date for PT Re-Evaluation  02/12/19    Authorization Type  UHC    PT Start Time  0757    PT Stop Time  0840    PT Time Calculation (min)  43 min    Activity Tolerance  Patient tolerated treatment well    Behavior During Therapy  [facility] for tasks assessed/performed       Past Medical History:  Diagnosis Date  . Diabetes mellitus without complication ([place])    Type II  . Family history of colon cancer   . Family history of prostate cancer   . Family history of stomach cancer   . Family history of uterine cancer   . History of chemotherapy LAST CHEMO NOV 2019  . Hyperlipidemia   . Neuropathy    FEET AND HANDS PAIN IN LEGS AT TIMES    Past Surgical History:  Procedure Laterality Date  . ACHILLES TENDON REPAIR Right 2005  . APPENDECTOMY    . CYSTOSCOPY WITH STENT PLACEMENT Bilateral 01/17/2018   Procedure: CYSTOSCOPY WITH STENT PLACEMENT;  Surgeon: Kathie Rhodes, MD;  [facility];  Service: Urology;  Laterality: Bilateral;  . FLEXIBLE SIGMOIDOSCOPY N/A 01/17/2018   Procedure: FLEXIBLE SIGMOIDOSCOPY;  Surgeon: Ileana Roup, MD;  [facility];  Service: General;  Laterality: N/A;  . HERNIA REPAIR     as a baby  . ILEOSTOMY N/A 01/17/2018   Procedure: possible diverting LOOP ILEOSTOMY;  Surgeon: Ileana Roup, MD;  [facility];  Service: General;  Laterality: N/A;  . PORT-A-CATH REMOVAL Right 02/17/2018   Procedure: REMOVAL OF PORT-A-CATH;  Surgeon: Ileana Roup, MD;  [facility];  Service: General;  Laterality: Right;  .  PORTACATH PLACEMENT N/A 06/17/2017   Procedure: RIGHT VS LEFT INTERNAL JUGULAR PORT-A-CATH PLACEMENT WITH ULTRASOUND;  Surgeon: Ileana Roup, MD;  [facility];  Service: General;  Laterality: N/A;  FLURO    There were no vitals filed for this visit.  Subjective Assessment - 01/13/19 0801    Subjective  Pt felt good 2 days after previous session    Currently in Pain?  Yes    Pain Score  6     Pain Location  Ankle    Pain Orientation  Left    Pain Descriptors / Indicators  Tightness    Multiple Pain Sites  No                       [facility] Adult PT Treatment/Exercise - 01/13/19 0001      Ultrasound   Ultrasound Location  Lt achilles    Ultrasound Parameters  100%, 1.0, 1Hz     Ultrasound Goals  Pain      Iontophoresis   Type of Iontophoresis  Dexamethasone    Location  left achilles    Dose  1.0 mL - #4    Time  4-6 hour release      Manual Therapy   Manual therapy comments  IASTM - achilles Lt    Soft tissue mobilization  achilles, gastroc,  soleus - LEFT only      Ankle Exercises: Standing   SLS  3x on foam pad    Other Standing Ankle Exercises  rocker board - 30 toe to heel - UE use minimal    Other Standing Ankle Exercises  tandem standing - 30 sec x 3      Ankle Exercises: Aerobic   Stationary Bike  L3 x 4 min - PT present for status update       Trigger Point Dry Needling - 01/13/19 0001    Consent Given?  Yes    Gastrocnemius Response  Twitch response elicited;Palpable increased muscle length    Soleus Response  Twitch response elicited;Palpable increased muscle length           PT Education - 01/13/19 0809    Education Details  Access Code: [email or web address]    Person(s) Educated  Patient    Methods  Explanation;Demonstration;Handout;Verbal cues    Comprehension  Verbalized understanding;Returned demonstration       PT Short Term Goals - 01/05/19 1020      PT SHORT TERM GOAL #1   Title  Pt will report 25% less overall heel pain.     Status  On-going      PT SHORT TERM GOAL #2   Title  ...    Baseline  ...        PT Long Term Goals - 01/13/19 0902      PT LONG TERM GOAL #1   Title  Pt will report overall less than 2-3/10 pain with ususal activity including community ambulation and stairs.    Baseline  6/10    Status  On-going            Plan - 01/13/19 0856    Clinical Impression Statement  Pt was sore again after treatment today, but similar treatment was given from previous session due to good response the follow several days.  Exercises for improved ankle stability were added to treatment and HEP as well.  Pt will continue to benefit from skilled PT to address pain and improve strength and ROM for improved gait.    PT Treatment/Interventions  ADLs/Self Care Home Management;Biofeedback;Cryotherapy;Electrical Stimulation;Moist Heat;Neuromuscular re-education;Therapeutic activities;Therapeutic exercise;Manual techniques;Patient/family education;Dry needling;Taping;Passive range of motion;Iontophoresis 4mg /ml Dexamethasone;Ultrasound;Gait training    PT Next Visit Plan  f/u on DN to Lt gastroc/soleus, ionto #4, progress ankle stability, SLS, eccentric calf    PT Home Exercise Plan  Access Code: [email or web address]    Consulted and Agree with Plan of Care  Patient       Patient will benefit from skilled therapeutic intervention in order to improve the following deficits and impairments:  Decreased activity tolerance, Decreased strength, Pain, Increased fascial restricitons, Impaired flexibility, Decreased coordination, Difficulty walking  Visit Diagnosis: Pain in left ankle and joints of left foot  Muscle weakness (generalized)  Other abnormalities of gait and mobility     Problem List Patient Active Problem List   Diagnosis Date Noted  . Insulin-requiring or dependent type II diabetes mellitus ([place])   . Hyperlipidemia   . Neuropathy   . Genetic testing 08/27/2017  . Family history of colon cancer   .  Family history of uterine cancer   . Family history of stomach cancer   . Family history of prostate cancer   . Port-A-Cath in place 06/20/2017  . Rectal cancer s/p robotic LAR rectosigmoid resection 01/17/2018 06/12/2017  . Other and unspecified hyperlipidemia 06/19/2013    Jule Ser, PT 01/13/2019, 9:02  AM  [facility] [address] [place], [place], [address] Phone: [phone]   Fax:  [phone]  Name: Charles [name] MRN: [id number] Date of Birth: 12/05/1965

## 2019-01-16 ENCOUNTER — Ambulatory Visit: Payer: 59 | Admitting: Physical Therapy

## 2019-01-16 ENCOUNTER — Other Ambulatory Visit: Payer: Self-pay

## 2019-01-16 DIAGNOSIS — M25572 Pain in left ankle and joints of left foot: Secondary | ICD-10-CM | POA: Diagnosis not present

## 2019-01-16 DIAGNOSIS — M6281 Muscle weakness (generalized): Secondary | ICD-10-CM

## 2019-01-16 DIAGNOSIS — R2689 Other abnormalities of gait and mobility: Secondary | ICD-10-CM

## 2019-01-16 NOTE — Therapy (Signed)
[facility] [address] [place], [place], [address] Phone: [phone]   Fax:  [phone]  Physical Therapy Treatment  Patient Details  Name: Charles [name] MRN: [id number] Date of Birth: 07-26-1965 Referring Provider (PT): Frederik Pear, MD   Encounter Date: 01/16/2019  PT End of Session - 01/16/19 0804    Visit Number  6    Number of Visits  8    Date for PT Re-Evaluation  02/12/19    Authorization Type  UHC    PT Start Time  0804    PT Stop Time  0844    PT Time Calculation (min)  40 min    Activity Tolerance  Patient tolerated treatment well    Behavior During Therapy  [facility] for tasks assessed/performed       Past Medical History:  Diagnosis Date  . Diabetes mellitus without complication ([place])    Type II  . Family history of colon cancer   . Family history of prostate cancer   . Family history of stomach cancer   . Family history of uterine cancer   . History of chemotherapy LAST CHEMO NOV 2019  . Hyperlipidemia   . Neuropathy    FEET AND HANDS PAIN IN LEGS AT TIMES    Past Surgical History:  Procedure Laterality Date  . ACHILLES TENDON REPAIR Right 2005  . APPENDECTOMY    . CYSTOSCOPY WITH STENT PLACEMENT Bilateral 01/17/2018   Procedure: CYSTOSCOPY WITH STENT PLACEMENT;  Surgeon: Kathie Rhodes, MD;  [facility];  Service: Urology;  Laterality: Bilateral;  . FLEXIBLE SIGMOIDOSCOPY N/A 01/17/2018   Procedure: FLEXIBLE SIGMOIDOSCOPY;  Surgeon: Ileana Roup, MD;  [facility];  Service: General;  Laterality: N/A;  . HERNIA REPAIR     as a baby  . ILEOSTOMY N/A 01/17/2018   Procedure: possible diverting LOOP ILEOSTOMY;  Surgeon: Ileana Roup, MD;  [facility];  Service: General;  Laterality: N/A;  . PORT-A-CATH REMOVAL Right 02/17/2018   Procedure: REMOVAL OF PORT-A-CATH;  Surgeon: Ileana Roup, MD;  [facility];  Service: General;  Laterality: Right;  .  PORTACATH PLACEMENT N/A 06/17/2017   Procedure: RIGHT VS LEFT INTERNAL JUGULAR PORT-A-CATH PLACEMENT WITH ULTRASOUND;  Surgeon: Ileana Roup, MD;  [facility];  Service: General;  Laterality: N/A;  FLURO    There were no vitals filed for this visit.  Subjective Assessment - 01/16/19 0806    Subjective  Pt states it maybe wasn't as tight this morning than Monday.  Just stiff no pain today.    Pertinent History  hx of rectal cancer, chemo, radiation, rectal surgery, Rt achillies repair 2005,DM    Patient Stated Goals  be able to walk without limping and get back to normal.    Currently in Pain?  No/denies                       [facility] Adult PT Treatment/Exercise - 01/16/19 0001      Ultrasound   Ultrasound Location  Lt achilles    Ultrasound Parameters  100%, 1.0Hz , 1.0 intensity    Ultrasound Goals  Pain      Iontophoresis   Type of Iontophoresis  Dexamethasone    Location  left achilles    Dose  1.0 mL - #5    Time  4-6 hour release      Manual Therapy   Manual therapy comments  IASTM - achilles Lt  Soft tissue mobilization  achilles, gastroc, soleus - LEFT only      Ankle Exercises: Aerobic   Stationary Bike  L3 x 4 min - PT present for status update      Ankle Exercises: Standing   Braiding (Round Trip)  2    Other Standing Ankle Exercises  tandem walking - 2 round trip across carpet      gastroc and soleus stretch 3 x 20sec Lt          PT Short Term Goals - 01/05/19 1020      PT SHORT TERM GOAL #1   Title  Pt will report 25% less overall heel pain.    Status  On-going      PT SHORT TERM GOAL #2   Title  ...    Baseline  ...        PT Long Term Goals - 01/13/19 0902      PT LONG TERM GOAL #1   Title  Pt will report overall less than 2-3/10 pain with ususal activity including community ambulation and stairs.    Baseline  6/10    Status  On-going            Plan - 01/16/19 0850    Clinical Impression Statement  Pt  was able to do dynamic walking exercises to improve ankle stability today.  He is busy at work and has not been consistent with walking program yet, but he is doing the other exercises.  At this time there is some noticeable improvement in smoothness of the tendon palpated duringSTM and IAMSTM.  Pt will continue to benefit from imporved ankle strength and soft tissue lengthening.    PT Treatment/Interventions  ADLs/Self Care Home Management;Biofeedback;Cryotherapy;Electrical Stimulation;Moist Heat;Neuromuscular re-education;Therapeutic activities;Therapeutic exercise;Manual techniques;Patient/family education;Dry needling;Taping;Passive range of motion;Iontophoresis 4mg /ml Dexamethasone;Ultrasound;Gait training    PT Next Visit Plan  BAPS, ionto #5, progress ankle stability, SLS, eccentric calf    PT Home Exercise Plan  Access Code: [email or web address]    Consulted and Agree with Plan of Care  Patient       Patient will benefit from skilled therapeutic intervention in order to improve the following deficits and impairments:  Decreased activity tolerance, Decreased strength, Pain, Increased fascial restricitons, Impaired flexibility, Decreased coordination, Difficulty walking  Visit Diagnosis: Pain in left ankle and joints of left foot  Muscle weakness (generalized)  Other abnormalities of gait and mobility     Problem List Patient Active Problem List   Diagnosis Date Noted  . Insulin-requiring or dependent type II diabetes mellitus ([place])   . Hyperlipidemia   . Neuropathy   . Genetic testing 08/27/2017  . Family history of colon cancer   . Family history of uterine cancer   . Family history of stomach cancer   . Family history of prostate cancer   . Port-A-Cath in place 06/20/2017  . Rectal cancer s/p robotic LAR rectosigmoid resection 01/17/2018 06/12/2017  . Other and unspecified hyperlipidemia 06/19/2013    Jule Ser, PT 01/16/2019, 8:59 AM  [facility] [address] [place], [place], [address] Phone: [phone]   Fax:  [phone]  Name: Charles [name] MRN: [id number] Date of Birth: [birth year]

## 2019-01-19 ENCOUNTER — Other Ambulatory Visit: Payer: Self-pay

## 2019-01-19 ENCOUNTER — Inpatient Hospital Stay: Payer: 59 | Attending: Oncology | Admitting: Oncology

## 2019-01-19 ENCOUNTER — Inpatient Hospital Stay: Payer: 59

## 2019-01-19 VITALS — BP 137/81 | HR 114 | Temp 97.8°F | Resp 16 | Ht 68.0 in | Wt 211.5 lb

## 2019-01-19 DIAGNOSIS — C20 Malignant neoplasm of rectum: Secondary | ICD-10-CM | POA: Insufficient documentation

## 2019-01-19 DIAGNOSIS — R194 Change in bowel habit: Secondary | ICD-10-CM | POA: Insufficient documentation

## 2019-01-19 DIAGNOSIS — E119 Type 2 diabetes mellitus without complications: Secondary | ICD-10-CM | POA: Diagnosis not present

## 2019-01-19 DIAGNOSIS — R200 Anesthesia of skin: Secondary | ICD-10-CM | POA: Insufficient documentation

## 2019-01-19 LAB — CEA (IN HOUSE-CHCC): CEA (CHCC-In House): 1.31 ng/mL (ref 0.00–5.00)

## 2019-01-19 NOTE — Progress Notes (Signed)
[facility] OFFICE PROGRESS NOTE   Diagnosis: Rectal cancer INTERVAL HISTORY:   [name] returns for a scheduled visit.  He generally feels well.  Good appetite.  He is working.  He continues to have irregular bowel habits.  No incontinence.  He has persistent numbness in the feet.  He has mild numbness in the fingers.  The finger numbness does not interfere with activity.  He has some difficulty with balance.  He is no longer taking gabapentin.  Objective:  Vital signs in last 24 hours:  Blood pressure 137/81, pulse (!) 114, temperature 97.8 F (36.6 C), temperature source Temporal, resp. rate 16, height '5\' 8"'$  (1.727 m), weight 211 lb 8 oz (95.9 kg), SpO2 100 %.    Limited physical examination secondary to distancing with the Covid pandemic Lymphatics: No cervical, supraclavicular, axillary, or inguinal nodes GI: No hepatosplenomegaly, no mass, nontender Vascular: No leg edema   Lab Results:  Lab Results  Component Value Date   WBC 5.1 02/17/2018   HGB 12.8 (L) 02/17/2018   HCT 38.4 (L) 02/17/2018   MCV 94.3 02/17/2018   PLT 175 02/17/2018   NEUTROABS 4.1 02/17/2018    CMP  Lab Results  Component Value Date   NA 139 04/04/2018   K 4.5 04/04/2018   CL 105 04/04/2018   CO2 28 04/04/2018   GLUCOSE 208 (H) 04/04/2018   BUN 11 04/04/2018   CREATININE 0.95 04/04/2018   CALCIUM 9.6 04/04/2018   PROT 7.4 04/04/2018   ALBUMIN 3.8 04/04/2018   AST 11 (L) 04/04/2018   ALT 16 04/04/2018   ALKPHOS 123 04/04/2018   BILITOT 0.6 04/04/2018   GFRNONAA >60 04/04/2018   GFRAA >60 04/04/2018    Lab Results  Component Value Date   CEA1 1.31 01/19/2019     Medications: I have reviewed the patient's current medications.   Assessment/Plan: 1. Rectal cancer, clinical stage III ? Colonoscopy 05/24/2017, mass at 12 cm from the anal verge, biopsy confirmed invasive poorly differentiated adenocarcinoma ? CTs 06/06/2017-rectal mass, perirectal adenopathy, no  evidence of metastatic disease, nonspecific 1 cm sclerotic lesion at the right iliac crest ? MRI 06/06/2017, T3cN1tumor measured at 5.3 cm from the anal sphincter ? Cycle 1 FOLFOX 06/20/2017 ? Cycle 2 FOLFOX 07/04/2017 ? Cycle 3 FOLFOX 07/17/2017 ? Cycle 4 FOLFOX 08/01/2017 (Emend added secondary to prolonged nausea following chemotherapy) ? Cycle 5 FOLFOX 08/15/2017 ? Cycle 6 FOLFOX 08/29/2017 ? Cycle 7 FOLFOX 09/12/2017 ? Cycle 8 FOLFOX 09/26/2017 (dose reduced due to neuropathy and thrombocytopenia) ? Initiation of radiation and capecitabine 10/16/2017, completed 11/22/2017 ? Restaging CTs 12/10/2017- no evidence of metastatic disease, decrease in the rectal mass and near complete resolution of perirectal lymphadenopathy  ? Status post low anterior resection 01/17/2018-1 cm poorly differentiated adenocarcinoma; tumor limited to submucosa; margins negative; metastatic carcinoma in 1 of 18 lymph nodes; isolated tumor cells in 1 lymph node; treatment effect present; no loss of mismatch repair protein expression  ? CTs 07/04/2018-no evidence of recurrent disease ? Colonoscopy 06/24/2018-polyps removed from the ascending and descending colon, tubular adenoma and a benign submucosal myoma  2. Diabetes 3. Glaucoma 4. Cecal polyp, tubular adenoma noted on the colonoscopy 05/24/2017 5. Port-A-Cath placement,Dr. White,06/17/2017 6. Oxaliplatinneuropathy-improved     Disposition: [name] is in remission from rectal cancer.  He will return for an office visit and restaging CTs in 6 months.  Hopefully there will be further improvement in the peripheral neuropathy symptoms. Betsy Coder, MD  01/19/2019  1:17  PM   

## 2019-01-20 ENCOUNTER — Telehealth: Payer: Self-pay | Admitting: Oncology

## 2019-01-20 ENCOUNTER — Encounter: Payer: Self-pay | Admitting: Physical Therapy

## 2019-01-20 ENCOUNTER — Ambulatory Visit: Payer: 59 | Admitting: Physical Therapy

## 2019-01-20 DIAGNOSIS — R279 Unspecified lack of coordination: Secondary | ICD-10-CM

## 2019-01-20 DIAGNOSIS — M25572 Pain in left ankle and joints of left foot: Secondary | ICD-10-CM | POA: Diagnosis not present

## 2019-01-20 DIAGNOSIS — M6281 Muscle weakness (generalized): Secondary | ICD-10-CM

## 2019-01-20 DIAGNOSIS — R2689 Other abnormalities of gait and mobility: Secondary | ICD-10-CM

## 2019-01-20 NOTE — Telephone Encounter (Signed)
Scheduled per los. Called and left msg. Mailed printout  °

## 2019-01-20 NOTE — Therapy (Signed)
[facility] [address] [place], [place], [address] Phone: [phone]   Fax:  [phone]  Physical Therapy Treatment  Patient Details  Name: Charles [name] MRN: [id number] Date of Birth: [birth year] Referring Provider (PT): Frederik Pear, MD   Encounter Date: 01/20/2019  PT End of Session - 01/20/19 0826    Visit Number  7    Number of Visits  8    Date for PT Re-Evaluation  02/12/19    Authorization Type  UHC    PT Start Time  0835    PT Stop Time  0915    PT Time Calculation (min)  40 min    Activity Tolerance  Patient tolerated treatment well    Behavior During Therapy  [facility] for tasks assessed/performed       Past Medical History:  Diagnosis Date  . Diabetes mellitus without complication ([place])    Type II  . Family history of colon cancer   . Family history of prostate cancer   . Family history of stomach cancer   . Family history of uterine cancer   . History of chemotherapy LAST CHEMO NOV 2019  . Hyperlipidemia   . Neuropathy    FEET AND HANDS PAIN IN LEGS AT TIMES    Past Surgical History:  Procedure Laterality Date  . ACHILLES TENDON REPAIR Right 2005  . APPENDECTOMY    . CYSTOSCOPY WITH STENT PLACEMENT Bilateral 01/17/2018   Procedure: CYSTOSCOPY WITH STENT PLACEMENT;  Surgeon: Kathie Rhodes, MD;  [facility];  Service: Urology;  Laterality: Bilateral;  . FLEXIBLE SIGMOIDOSCOPY N/A 01/17/2018   Procedure: FLEXIBLE SIGMOIDOSCOPY;  Surgeon: Ileana Roup, MD;  [facility];  Service: General;  Laterality: N/A;  . HERNIA REPAIR     as a baby  . ILEOSTOMY N/A 01/17/2018   Procedure: possible diverting LOOP ILEOSTOMY;  Surgeon: Ileana Roup, MD;  [facility];  Service: General;  Laterality: N/A;  . PORT-A-CATH REMOVAL Right 02/17/2018   Procedure: REMOVAL OF PORT-A-CATH;  Surgeon: Ileana Roup, MD;  [facility];  Service: General;  Laterality: Right;  .  PORTACATH PLACEMENT N/A 06/17/2017   Procedure: RIGHT VS LEFT INTERNAL JUGULAR PORT-A-CATH PLACEMENT WITH ULTRASOUND;  Surgeon: Ileana Roup, MD;  [facility];  Service: General;  Laterality: N/A;  FLURO    There were no vitals filed for this visit.  Subjective Assessment - 01/20/19 0944    Subjective  I felt better after walking. No pain just a little stiff this morning    Patient Stated Goals  be able to walk without limping and get back to normal.    Currently in Pain?  No/denies                       [facility] Adult PT Treatment/Exercise - 01/20/19 0001      Lumbar Exercises: Aerobic   Stationary Bike  L3 x 5 min      Ultrasound   Ultrasound Location  Lt achilles    Ultrasound Parameters  100%, 47mHz, 1.0 intensity    Ultrasound Goals  Pain      Iontophoresis   Type of Iontophoresis  Dexamethasone    Location  left achilles    Dose  1.0 mL - #6    Time  4-6 hour release      Manual Therapy   Soft tissue mobilization  achilles, gastroc, soleus - LEFT only  Ankle Exercises: Standing   SLS  sliders standing on Lt side - 10x each side    Other Standing Ankle Exercises  tandem standing - x 2 min      Ankle Exercises: Aerobic   Stationary Bike  L3 x 4 min - PT present for status update       Trigger Point Dry Needling - 01/20/19 0001    Consent Given?  Yes    Education Handout Provided  Previously provided    Gastrocnemius Response  Twitch response elicited;Palpable increased muscle length    Soleus Response  Twitch response elicited;Palpable increased muscle length             PT Short Term Goals - 01/05/19 1020      PT SHORT TERM GOAL #1   Title  Pt will report 25% less overall heel pain.    Status  On-going      PT SHORT TERM GOAL #2   Title  ...    Baseline  ...        PT Long Term Goals - 01/13/19 0902      PT LONG TERM GOAL #1   Title  Pt will report overall less than 2-3/10 pain with ususal activity including  community ambulation and stairs.    Baseline  6/10    Status  On-going            Plan - 01/20/19 0940    Clinical Impression Statement  Pt was feeling better today after walking yesterday.  Pt was able to perform strengthening in single leg with eccentric loading closed chain for slider.  Pt will benefit from skilled PT to continue to progress eccentric calf strength and ensure he is independent with walking program.    PT Treatment/Interventions  ADLs/Self Care Home Management;Biofeedback;Cryotherapy;Electrical Stimulation;Moist Heat;Neuromuscular re-education;Therapeutic activities;Therapeutic exercise;Manual techniques;Patient/family education;Dry needling;Taping;Passive range of motion;Iontophoresis 4mg /ml Dexamethasone;Ultrasound;Gait training    PT Next Visit Plan  re-eval; BAPS, progress ankle stability, SLS, eccentric calf    PT Home Exercise Plan  Access Code: [email or web address]    Consulted and Agree with Plan of Care  Patient       Patient will benefit from skilled therapeutic intervention in order to improve the following deficits and impairments:  Decreased activity tolerance, Decreased strength, Pain, Increased fascial restricitons, Impaired flexibility, Decreased coordination, Difficulty walking  Visit Diagnosis: Pain in left ankle and joints of left foot  Muscle weakness (generalized)  Other abnormalities of gait and mobility  Unspecified lack of coordination     Problem List Patient Active Problem List   Diagnosis Date Noted  . Insulin-requiring or dependent type II diabetes mellitus ([place])   . Hyperlipidemia   . Neuropathy   . Genetic testing 08/27/2017  . Family history of colon cancer   . Family history of uterine cancer   . Family history of stomach cancer   . Family history of prostate cancer   . Port-A-Cath in place 06/20/2017  . Rectal cancer s/p robotic LAR rectosigmoid resection 01/17/2018 06/12/2017  . Other and unspecified hyperlipidemia 06/19/2013     Jule Ser, PT 01/20/2019, 9:45 AM  [facility] [address], [place], [place], [address] Phone: [phone]   Fax:  [phone]  Name: Charles [name] MRN: [id number] Date of Birth: 1965-11-26

## 2019-01-23 ENCOUNTER — Other Ambulatory Visit: Payer: Self-pay

## 2019-01-23 ENCOUNTER — Ambulatory Visit: Payer: 59 | Admitting: Physical Therapy

## 2019-01-23 ENCOUNTER — Encounter: Payer: Self-pay | Admitting: Physical Therapy

## 2019-01-23 DIAGNOSIS — M25572 Pain in left ankle and joints of left foot: Secondary | ICD-10-CM | POA: Diagnosis not present

## 2019-01-23 DIAGNOSIS — R2689 Other abnormalities of gait and mobility: Secondary | ICD-10-CM

## 2019-01-23 DIAGNOSIS — M6281 Muscle weakness (generalized): Secondary | ICD-10-CM

## 2019-01-23 DIAGNOSIS — R279 Unspecified lack of coordination: Secondary | ICD-10-CM

## 2019-01-23 NOTE — Therapy (Addendum)
[facility] [address] [place], [place], [address] Phone: [phone]   Fax:  [phone]  Physical Therapy Treatment  Patient Details  Name: Charles [name] MRN: [id number] Date of Birth: 25-Apr-1965 Referring Provider (PT): Frederik Pear, MD   Encounter Date: 01/23/2019  PT End of Session - 01/23/19 0806    Visit Number  8    Number of Visits  8    Date for PT Re-Evaluation  02/12/19    Authorization Type  [employer]    PT Start Time  0804    PT Stop Time  0842    PT Time Calculation (min)  38 min    Activity Tolerance  Patient tolerated treatment well    Behavior During Therapy  [facility] for tasks assessed/performed       Past Medical History:  Diagnosis Date  . Diabetes mellitus without complication ([place])    Type II  . Family history of colon cancer   . Family history of prostate cancer   . Family history of stomach cancer   . Family history of uterine cancer   . History of chemotherapy LAST CHEMO NOV 2019  . Hyperlipidemia   . Neuropathy    FEET AND HANDS PAIN IN LEGS AT TIMES    Past Surgical History:  Procedure Laterality Date  . ACHILLES TENDON REPAIR Right 2005  . APPENDECTOMY    . CYSTOSCOPY WITH STENT PLACEMENT Bilateral 01/17/2018   Procedure: CYSTOSCOPY WITH STENT PLACEMENT;  Surgeon: Kathie Rhodes, MD;  Location: [facility];  Service: Urology;  Laterality: Bilateral;  . FLEXIBLE SIGMOIDOSCOPY N/A 01/17/2018   Procedure: FLEXIBLE SIGMOIDOSCOPY;  Surgeon: Ileana Roup, MD;  Location: [facility];  Service: General;  Laterality: N/A;  . HERNIA REPAIR     as a baby  . ILEOSTOMY N/A 01/17/2018   Procedure: possible diverting LOOP ILEOSTOMY;  Surgeon: Ileana Roup, MD;  Location: [facility];  Service: General;  Laterality: N/A;  . PORT-A-CATH REMOVAL Right 02/17/2018   Procedure: REMOVAL OF PORT-A-CATH;  Surgeon: Ileana Roup, MD;  Location: [facility];  Service: General;  Laterality: Right;  .  PORTACATH PLACEMENT N/A 06/17/2017   Procedure: RIGHT VS LEFT INTERNAL JUGULAR PORT-A-CATH PLACEMENT WITH ULTRASOUND;  Surgeon: Ileana Roup, MD;  [facility]: [facility];  Service: General;  Laterality: N/A;  FLURO    There were no vitals filed for this visit.  Subjective Assessment - 01/23/19 0807    Subjective  I feel like I did the first day again after walking every day for 15 minutes up and down a hill.  Pt states he is limping.    Limitations  Walking;Standing    Patient Stated Goals  be able to walk without limping and get back to normal.    Currently in Pain?  Yes    Pain Score  7     Pain Location  Ankle    Pain Descriptors / Indicators  Sharp    Pain Type  Acute pain    Pain Onset  More than a month ago    Pain Frequency  Intermittent    Aggravating Factors   walking    Pain Relieving Factors  rest, Advil    Multiple Pain Sites  No         [facility] PT Assessment - 01/23/19 0001      Assessment   Medical Diagnosis  Lt achillies tendonitis    Referring Provider (PT)  Frederik Pear, MD  AROM   Left Ankle Dorsiflexion  5      Strength   Overall Strength Comments  3/5 Lt ankle plantarflexion - cannot do one single leg calf raise; can hold against max resistance from PT without pain                   [facility] Adult PT Treatment/Exercise - 01/23/19 0001      Lumbar Exercises: Aerobic   Stationary Bike  L3 x 5 min      Ultrasound   Ultrasound Location  Lt achilles    Ultrasound Parameters  100%, 3.3MHz, 1.0 intensity    Ultrasound Goals  Pain      Manual Therapy   Soft tissue mobilization  achilles, gastroc, soleus - LEFT only               PT Short Term Goals - 01/05/19 1020      PT SHORT TERM GOAL #1   Title  Pt will report 25% less overall heel pain.    Status  On-going      PT SHORT TERM GOAL #2   Title  ...    Baseline  ...        PT Long Term Goals - 01/13/19 0902      PT LONG TERM GOAL #1   Title  Pt will report overall  less than 2-3/10 pain with ususal activity including community ambulation and stairs.    Baseline  6/10    Status  On-going            Plan - 01/23/19 0841    Clinical Impression Statement  Pt regressed over this week when adding 15 minutes walks to his routine.  He was educated in building more slowly.  Today's session focused on pain management.  Pt had increased tenderness at distal achilles palpated about 1 inch proximal from calcaneus attachment.  Pt will benefit from skilled PT but will cut treatments down to 1x/ week as he slowly works on regaining strength.    PT Treatment/Interventions  ADLs/Self Care Home Management;Biofeedback;Cryotherapy;Electrical Stimulation;Moist Heat;Neuromuscular re-education;Therapeutic activities;Therapeutic exercise;Manual techniques;Patient/family education;Dry needling;Taping;Passive range of motion;Iontophoresis 60m/ml Dexamethasone;Ultrasound;Gait training    PT Next Visit Plan  ROM, strength, BAPS, ankle stab, SLS, eccentric gastroc/soleus strength    PT Home Exercise Plan  Access Code: [email or web address]    Consulted and Agree with Plan of Care  Patient       Patient will benefit from skilled therapeutic intervention in order to improve the following deficits and impairments:  Decreased activity tolerance, Decreased strength, Pain, Increased fascial restricitons, Impaired flexibility, Decreased coordination, Difficulty walking  Visit Diagnosis: Pain in left ankle and joints of left foot  Muscle weakness (generalized)  Other abnormalities of gait and mobility  Unspecified lack of coordination     Problem List Patient Active Problem List   Diagnosis Date Noted  . Insulin-requiring or dependent type II diabetes mellitus ([place]   . Hyperlipidemia   . Neuropathy   . Genetic testing 08/27/2017  . Family history of colon cancer   . Family history of uterine cancer   . Family history of stomach cancer   . Family history of prostate cancer   .  Port-A-Cath in place 06/20/2017  . Rectal cancer s/p robotic LAR rectosigmoid resection 01/17/2018 06/12/2017  . Other and unspecified hyperlipidemia 06/19/2013    JJule Ser PT 01/23/2019, 9:13 AM  [facility] [address] [place] [address]Phone:  [phone]   Fax:  [phone]  Name: Charles [name] MRN: [id number] Date of Birth: [birth year]  PHYSICAL THERAPY DISCHARGE SUMMARY  Visits from Start of Care: 8  Current functional level related to goals / functional outcomes: See above goals   Remaining deficits: See above   Education / Equipment: HEP Plan: Patient agrees to discharge.  Patient goals were not met. Patient is being discharged due to the patient's request.  ?????    Gustavus Bryant, PT 03/09/19 11:20 AM

## 2019-01-29 ENCOUNTER — Ambulatory Visit: Payer: 59 | Admitting: Physical Therapy

## 2019-02-05 ENCOUNTER — Encounter: Payer: 59 | Admitting: Physical Therapy

## 2019-03-24 IMAGING — CT CT ANGIO CHEST
2 of 6 series · 19 of 46 positions shown · IV contrast (APPLIED)
Comparison: CT scan of June 06, 2017.

CLINICAL DATA: Dyspnea, tachycardia.  History of rectal cancer.

EXAM:
CT ANGIOGRAPHY CHEST WITH CONTRAST
TECHNIQUE: Multidetector CT imaging of the chest was performed using the
standard protocol during bolus administration of intravenous
contrast. Multiplanar CT image reconstructions and MIPs were
obtained to evaluate the vascular anatomy.
CONTRAST:  70 mL R4KH6I-MUE IOPAMIDOL (R4KH6I-MUE) INJECTION 76%

[Series 5: thins · axial · 0.68mm/px · z∈[-316,-69]mm · 17 of 271 slices shown]
[im 12/271  lung]
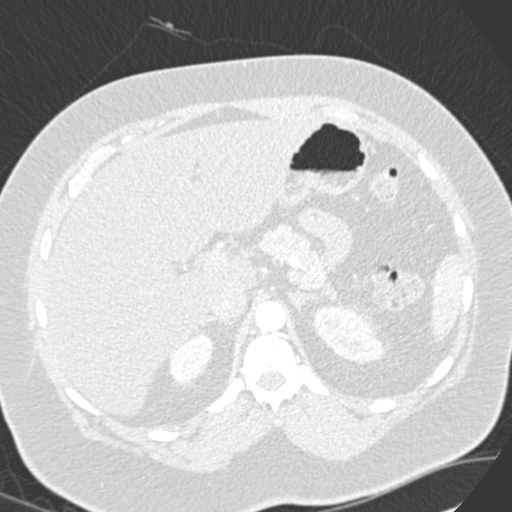
[im 24/271  soft-tissue]
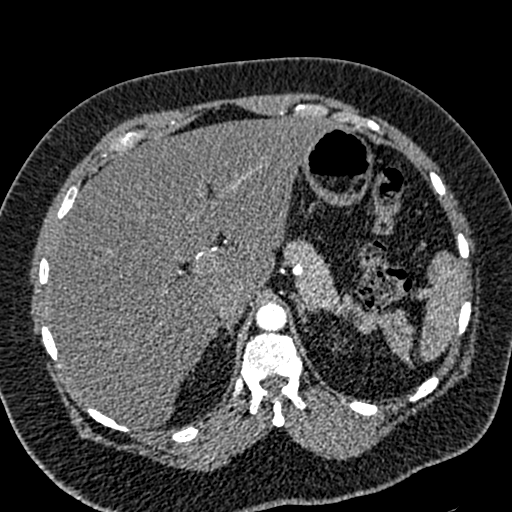
[im 47/271  lung]
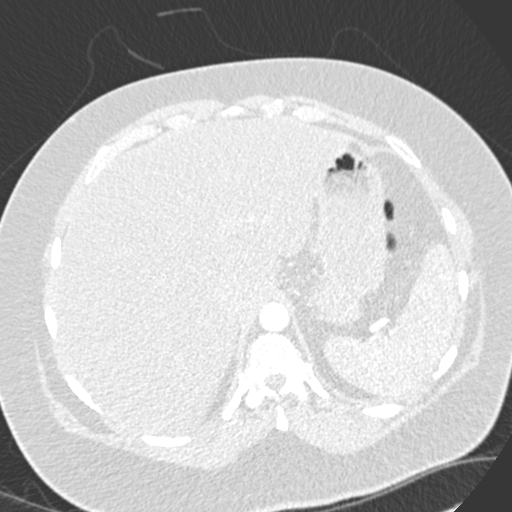
[im 59/271  soft-tissue]
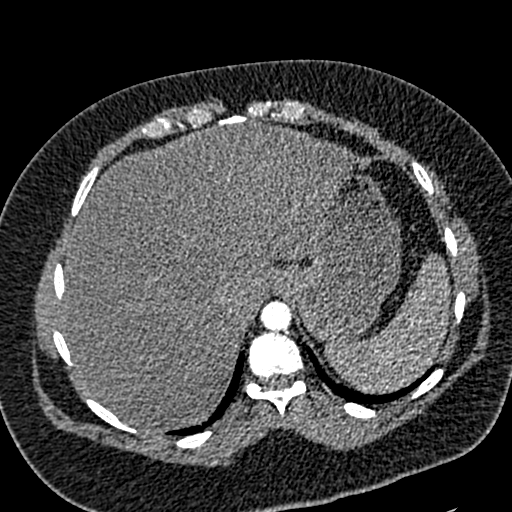
[im 71/271  lung]
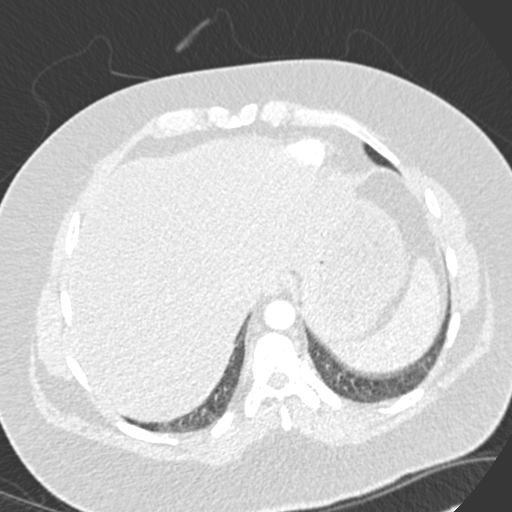
[im 94/271  soft-tissue]
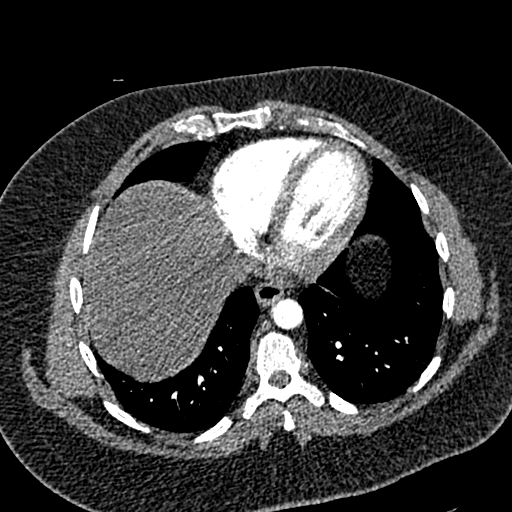
[im 106/271  lung]
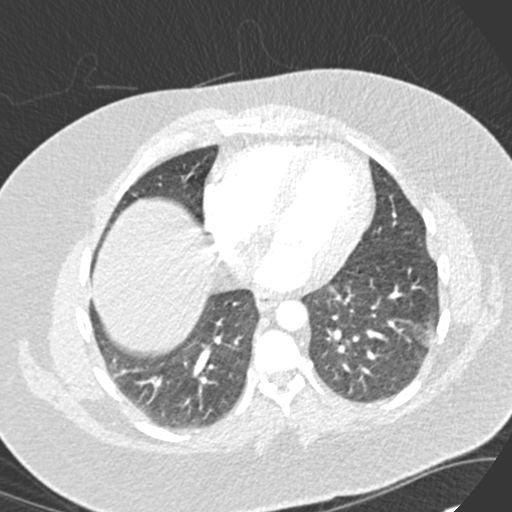
[im 118/271  soft-tissue]
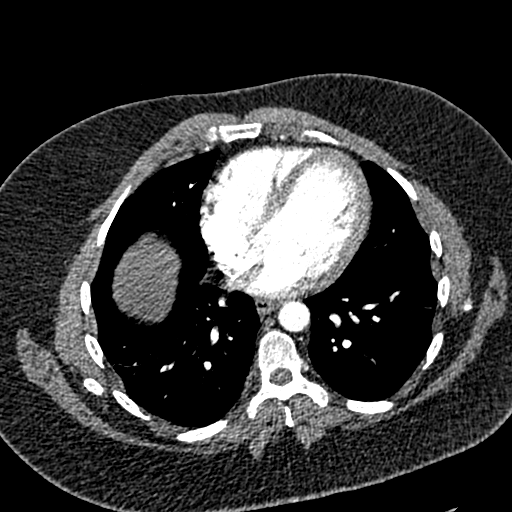
[im 141/271  lung]
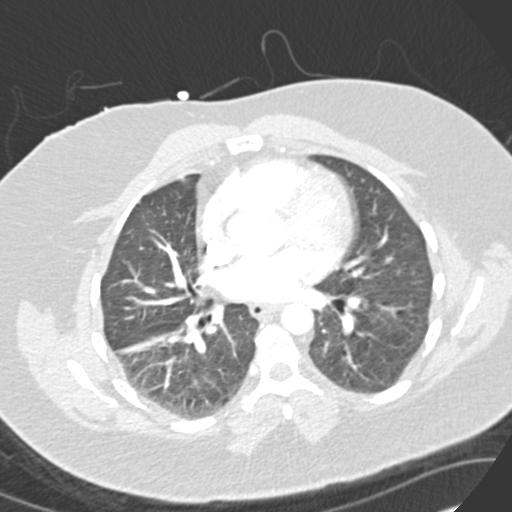
[im 153/271  soft-tissue]
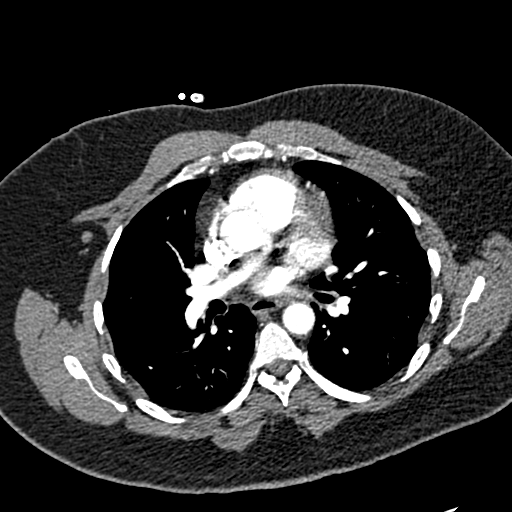
[im 165/271  lung]
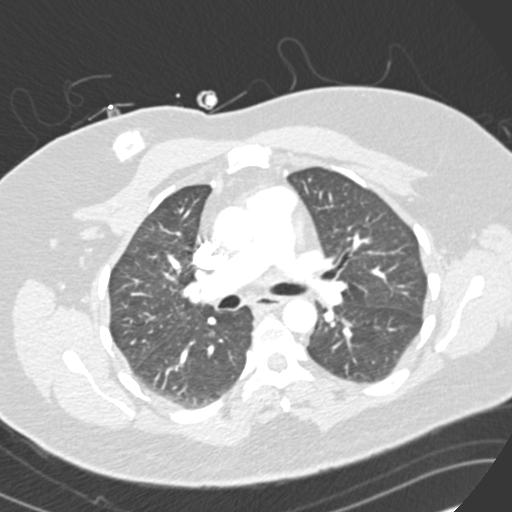
[im 177/271  soft-tissue]
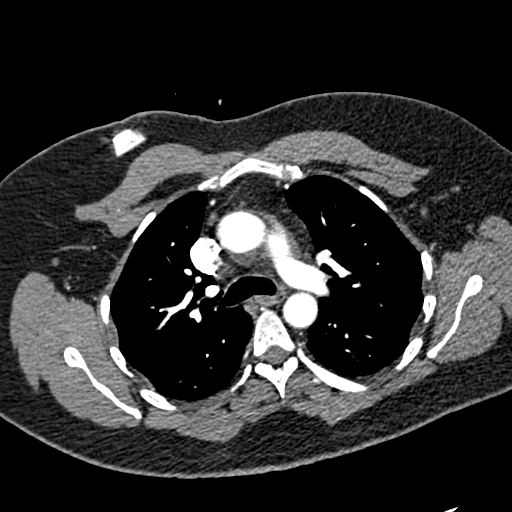
[im 200/271  lung]
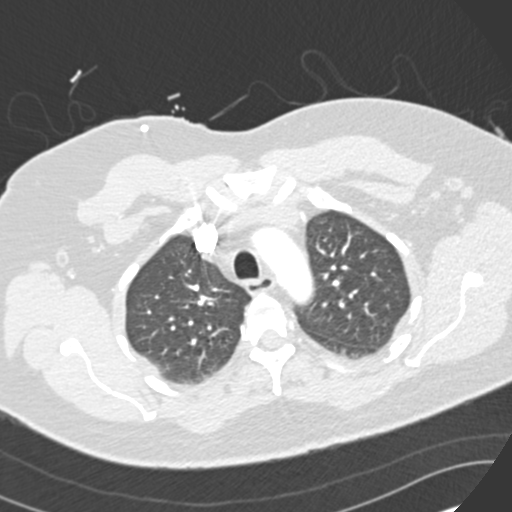
[im 212/271  soft-tissue]
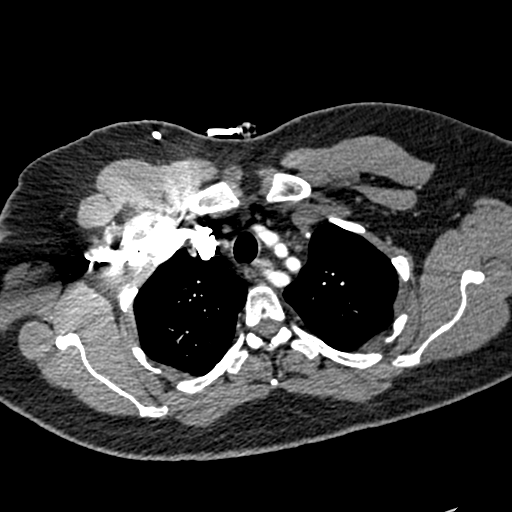
[im 224/271  lung]
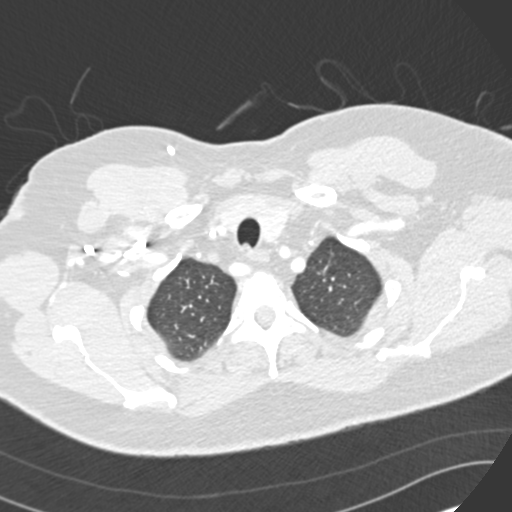
[im 247/271  soft-tissue]
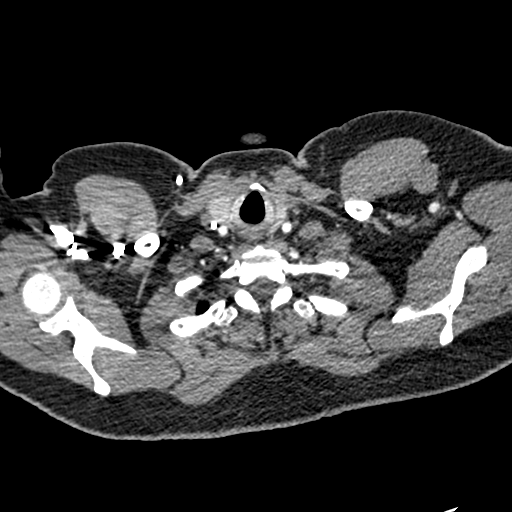
[im 259/271  lung]
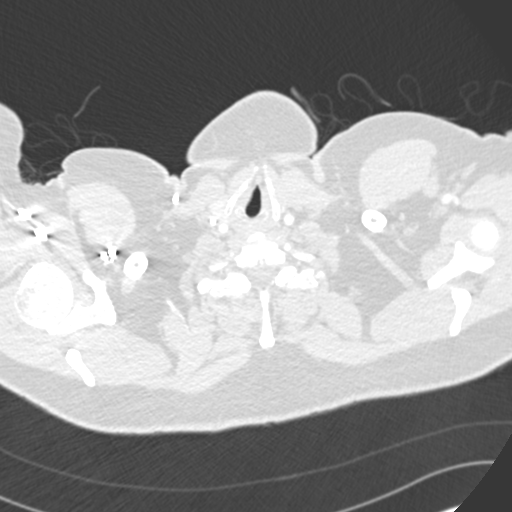

[Series 7: coronal mpr · coronal · 0.56mm/px · 2 of 82 slices shown]
[im 28/82  soft-tissue]
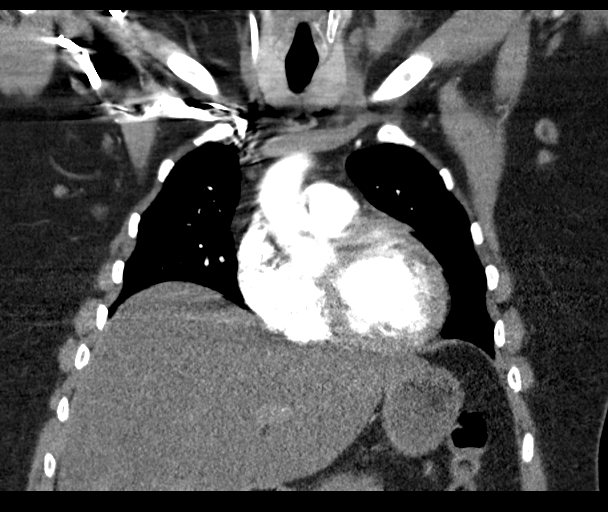
[im 55/82  soft-tissue]
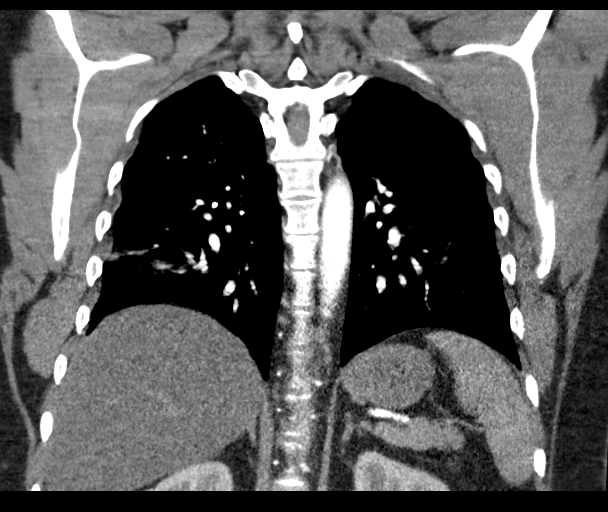

[19 of 46 positions shown; findings below may reference images not displayed]

FINDINGS: Cardiovascular: Satisfactory opacification of the pulmonary arteries
to the segmental level. No evidence of pulmonary embolism. Normal
heart size. No pericardial effusion. Aberrant right subclavian
artery is again noted which is congenital anomaly. There is no
evidence of thoracic aortic dissection or aneurysm.

Mediastinum/Nodes: No enlarged mediastinal, hilar, or axillary lymph
nodes. Thyroid gland, trachea, and esophagus demonstrate no
significant findings.

Lungs/Pleura: Lungs are clear. No pleural effusion or pneumothorax.

Upper Abdomen: Fatty infiltration of the liver is noted. No other
abnormality seen in visualized portion of upper abdomen.

Musculoskeletal: No chest wall abnormality. No acute or significant
osseous findings.

Review of the MIP images confirms the above findings.
IMPRESSION: No definite evidence of pulmonary embolus.

Fatty infiltration of the liver.

No other abnormality seen in the chest.

## 2019-04-02 ENCOUNTER — Ambulatory Visit: Payer: 59 | Attending: Orthopedic Surgery | Admitting: Physical Therapy

## 2019-04-02 ENCOUNTER — Other Ambulatory Visit: Payer: Self-pay

## 2019-04-02 DIAGNOSIS — M6281 Muscle weakness (generalized): Secondary | ICD-10-CM

## 2019-04-02 DIAGNOSIS — M542 Cervicalgia: Secondary | ICD-10-CM | POA: Diagnosis not present

## 2019-04-02 DIAGNOSIS — G8929 Other chronic pain: Secondary | ICD-10-CM | POA: Diagnosis present

## 2019-04-02 DIAGNOSIS — M62838 Other muscle spasm: Secondary | ICD-10-CM | POA: Diagnosis present

## 2019-04-02 DIAGNOSIS — M25512 Pain in left shoulder: Secondary | ICD-10-CM | POA: Diagnosis present

## 2019-04-02 DIAGNOSIS — R293 Abnormal posture: Secondary | ICD-10-CM | POA: Diagnosis present

## 2019-04-03 ENCOUNTER — Other Ambulatory Visit: Payer: Self-pay

## 2019-04-03 NOTE — Therapy (Signed)
[facility] [address] [place], [place], [address] Phone: [phone]   Fax:  [phone]  Physical Therapy Evaluation  Patient Details  Name: Charles [name] MRN: [id number] Date of Birth: 1965/04/28 Referring Provider (PT): Joanell Rising, [facility]   Encounter Date: 04/02/2019  PT End of Session - 04/03/19 0945    Visit Number  1    Date for PT Re-Evaluation  06/25/19    PT Start Time  1234    PT Stop Time  1312    PT Time Calculation (min)  38 min    Activity Tolerance  Patient tolerated treatment well    Behavior During Therapy  [facility] for tasks assessed/performed       Past Medical History:  Diagnosis Date  . Diabetes mellitus without complication ([place])    Type II  . Family history of colon cancer   . Family history of prostate cancer   . Family history of stomach cancer   . Family history of uterine cancer   . History of chemotherapy LAST CHEMO NOV 2019  . Hyperlipidemia   . Neuropathy    FEET AND HANDS PAIN IN LEGS AT TIMES    Past Surgical History:  Procedure Laterality Date  . ACHILLES TENDON REPAIR Right 2005  . APPENDECTOMY    . CYSTOSCOPY WITH STENT PLACEMENT Bilateral 01/17/2018   Procedure: CYSTOSCOPY WITH STENT PLACEMENT;  Surgeon: Kathie Rhodes, MD;  [facility]: [facility];  Service: Urology;  Laterality: Bilateral;  . FLEXIBLE SIGMOIDOSCOPY N/A 01/17/2018   Procedure: FLEXIBLE SIGMOIDOSCOPY;  Surgeon: Ileana Roup, MD;  [facility];  Service: General;  Laterality: N/A;  . HERNIA REPAIR     as a baby  . ILEOSTOMY N/A 01/17/2018   Procedure: possible diverting LOOP ILEOSTOMY;  Surgeon: Ileana Roup, MD;  [facility];  Service: General;  Laterality: N/A;  . PORT-A-CATH REMOVAL Right 02/17/2018   Procedure: REMOVAL OF PORT-A-CATH;  Surgeon: Ileana Roup, MD;  [facility];  Service: General;  Laterality: Right;  . PORTACATH PLACEMENT N/A 06/17/2017   Procedure: RIGHT VS  LEFT INTERNAL JUGULAR PORT-A-CATH PLACEMENT WITH ULTRASOUND;  Surgeon: Ileana Roup, MD;  [facility];  Service: General;  Laterality: N/A;  FLURO    There were no vitals filed for this visit.   Subjective Assessment - 04/02/19 1241    Subjective  Pt has been having pain in Lt shoulder blade, neck and weakness into the arm.    How long can you sit comfortably?  4 hours and then I need to take medicine, but always hurts    Patient Stated Goals  reduce pain    Currently in Pain?  Yes    Pain Score  7     Pain Location  Neck    Pain Orientation  Left;Lower    Pain Descriptors / Indicators  Sharp;Aching    Pain Type  Chronic pain;Acute pain    Pain Radiating Towards  shoulder blade and lateral deltoid    Pain Onset  1 to 4 weeks ago   exacerbation of pain in shoulder blade   Pain Frequency  Constant    Aggravating Factors   as working it gets worse    Pain Relieving Factors  Tylenol    Effect of Pain on Daily Activities  driving and working is hard and arm feels weak    Multiple Pain Sites  No         [facility] PT Assessment -  04/03/19 0001      Assessment   Medical Diagnosis  cervicalgia    Referring Provider (PT)  Joanell Rising, [facility]    Onset Date/Surgical Date  --   several months - worsening   Prior Therapy  No      Precautions   Precautions  None      Restrictions   Weight Bearing Restrictions  No      Balance Screen   Has the patient fallen in the past 6 months  No      [place] residence    Living Arrangements  Spouse/significant other      Prior Function   Level of Independence  Independent    Vocation  Full time employment      Cognition   Overall Cognitive Status  Within Functional Limits for tasks assessed      Observation/Other Assessments   Focus on Therapeutic Outcomes (FOTO)   49% limited      Posture/Postural Control   Posture/Postural Control  Postural limitations    Postural Limitations  Rounded  Shoulders;Forward head;Increased lumbar lordosis;Increased thoracic kyphosis;Anterior pelvic tilt      ROM / Strength   AROM / PROM / Strength  AROM;Strength      AROM   AROM Assessment Site  Cervical    Cervical Flexion  --   ++pain   Cervical Extension  --   a little pain   Cervical - Right Rotation  65    Cervical - Left Rotation  55      Strength   Overall Strength Comments  Lt shoulder flexion 4+/5; all other shoulder and cervical strength tests 5/5      Palpation   Palpation comment  tight suboccipitals, cervical paraspinals, pecs and anterior deltoi Lt side      Special Tests    Special Tests  Rotator Cuff Impingement    Rotator Cuff Impingment tests  Neer impingement test;Hawkins- Kennedy test      Neer Impingement test    Findings  Positive    Side  Left      Hawkins-Kennedy test   Findings  Positive    Side  Left                Objective measurements completed on examination: See above findings.      [facility] Adult PT Treatment/Exercise - 04/03/19 0001      Self-Care   Self-Care  Other Self-Care Comments    Other Self-Care Comments   intial HEP and posture to reduce impingement symptoms             PT Education - 04/03/19 0948    Education Details  Access Code: [facility]    Person(s) Educated  Patient    Methods  Explanation;Demonstration;Verbal cues;Handout    Comprehension  Verbalized understanding;Returned demonstration       PT Short Term Goals - 04/03/19 1002      PT SHORT TERM GOAL #1   Title  Pt will report 25% less neck pain.    Time  4    Period  Weeks    Status  New    Target Date  04/30/19      PT SHORT TERM GOAL #2   Title  ind with intial HEP    Time  4    Period  Weeks    Status  New    Target Date  04/30/19        PT  Long Term Goals - 04/03/19 0946      PT LONG TERM GOAL #1   Title  Pt will report at least 60% less pain when working on the computer and driving    Time  12    Period  Weeks    Status  New     Target Date  06/25/19      PT LONG TERM GOAL #2   Title  Pt will demonstrate 4+/5 MMT for scapular stabilizers    Baseline  4/5    Time  12    Period  Weeks    Status  New    Target Date  06/25/19      PT LONG TERM GOAL #3   Title  Pt will be I and compliant with HEP.    Time  12    Period  Weeks    Status  New    Target Date  06/25/19      PT LONG TERM GOAL #4   Title  Pt will demonstrate cervical rotation of at least 65 deg bilaterally without pain for improved driving    Time  12    Period  Weeks    Status  New    Target Date  06/25/19      PT LONG TERM GOAL #5   Title  FOTO < or = to 30% limited    Baseline  49%    Time  12    Period  Weeks    Status  New    Target Date  06/25/19             Plan - 04/03/19 0949    Clinical Impression Statement  Pt presents to clinic today due to Lt cervical and scapular pain that he is starting to feel worsening and causing weakness and pain into the Left arm.  Pt is positive for impingement based on Neer and Hawkins-Kennedy tests. He has mild shoulder weakness, but is not TTP anywhere around RTC attachements.  Pt has postural deficits and severely kyphotic thoracic spine with forward head and rounded shoulders.  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  He demonstrates deceased ROM as mentioned above.  Pt will benefit from skilled PT to address impairments so he can returnto full function and quality of life.    Personal Factors and Comorbidities  Time since onset of injury/illness/exacerbation    Examination-Participation Restrictions  Driving    Stability/Clinical Decision Making  Evolving/Moderate complexity    Clinical Decision Making  Low    Rehab Potential  Excellent    PT Frequency  2x / week    PT Duration  12 weeks    PT Treatment/Interventions  Taping;Dry needling;Passive range of motion;Manual techniques;Patient/family education;Therapeutic activities;Therapeutic exercise;Neuromuscular  re-education;Electrical Stimulation;Cryotherapy;Biofeedback;ADLs/Self Care Home Management;Iontophoresis 4mg /ml Dexamethasone;Ultrasound    PT Next Visit Plan  DN to cerivical paraspinals; cervical ROM; thoracic extension; scapular strength    PT Home Exercise Plan  Access Code: [email or web address]    Consulted and Agree with Plan of Care  Patient       Patient will benefit from skilled therapeutic intervention in order to improve the following deficits and impairments:  Postural dysfunction, Decreased strength, Pain, Increased muscle spasms, Decreased range of motion  Visit Diagnosis: Cervicalgia  Abnormal posture  Muscle weakness (generalized)  Other muscle spasm  Chronic left shoulder pain     Problem List Patient Active Problem List   Diagnosis Date Noted  . Insulin-requiring or  dependent type II diabetes mellitus ([place])   . Hyperlipidemia   . Neuropathy   . Genetic testing 08/27/2017  . Family history of colon cancer   . Family history of uterine cancer   . Family history of stomach cancer   . Family history of prostate cancer   . Port-A-Cath in place 06/20/2017  . Rectal cancer s/p robotic LAR rectosigmoid resection 01/17/2018 06/12/2017  . Other and unspecified hyperlipidemia 06/19/2013    Jule Ser, PT 04/03/2019, 10:10 AM  [facility] [address], [place], [place], [address] Phone: [phone]   Fax:  [phone]  Name: Charles [name] MRN: [id number] Date of Birth: [birth year]

## 2019-04-03 NOTE — Patient Instructions (Signed)
Access Code: [email or web address] URL: [email or web address] Date: 04/03/2019 Prepared by: Jari Favre  Exercises Charles [name] - 1 x daily - 7 x weekly - 10 reps - 3 sets  Patient Education Shoulder Impingement

## 2019-04-08 ENCOUNTER — Ambulatory Visit: Payer: 59 | Admitting: Physical Therapy

## 2019-04-08 ENCOUNTER — Encounter: Payer: Self-pay | Admitting: Physical Therapy

## 2019-04-08 ENCOUNTER — Other Ambulatory Visit: Payer: Self-pay

## 2019-04-08 DIAGNOSIS — M25512 Pain in left shoulder: Secondary | ICD-10-CM

## 2019-04-08 DIAGNOSIS — M542 Cervicalgia: Secondary | ICD-10-CM | POA: Diagnosis not present

## 2019-04-08 DIAGNOSIS — M6281 Muscle weakness (generalized): Secondary | ICD-10-CM

## 2019-04-08 DIAGNOSIS — R293 Abnormal posture: Secondary | ICD-10-CM

## 2019-04-08 DIAGNOSIS — M62838 Other muscle spasm: Secondary | ICD-10-CM

## 2019-04-08 DIAGNOSIS — G8929 Other chronic pain: Secondary | ICD-10-CM

## 2019-04-08 NOTE — Therapy (Signed)
[facility] [address], [place], [place], [address] Phone: [phone]   Fax:  [phone]  Physical Therapy Treatment  Patient Details  Name: Charles [name] MRN: [id number] Date of Birth: 03/13/1965 Referring Provider (PT): Joanell Rising, [facility]   Encounter Date: 04/08/2019  PT End of Session - 04/08/19 1231    Visit Number  2    Date for PT Re-Evaluation  06/25/19    PT Start Time  1231    PT Stop Time  1313    PT Time Calculation (min)  42 min    Activity Tolerance  Patient tolerated treatment well    Behavior During Therapy  [facility] for tasks assessed/performed       Past Medical History:  Diagnosis Date  . Diabetes mellitus without complication ([place])    Type II  . Family history of colon cancer   . Family history of prostate cancer   . Family history of stomach cancer   . Family history of uterine cancer   . History of chemotherapy LAST CHEMO NOV 2019  . Hyperlipidemia   . Neuropathy    FEET AND HANDS PAIN IN LEGS AT TIMES    Past Surgical History:  Procedure Laterality Date  . ACHILLES TENDON REPAIR Right 2005  . APPENDECTOMY    . CYSTOSCOPY WITH STENT PLACEMENT Bilateral 01/17/2018   Procedure: CYSTOSCOPY WITH STENT PLACEMENT;  Surgeon: Kathie Rhodes, MD;  [facility];  Service: Urology;  Laterality: Bilateral;  . FLEXIBLE SIGMOIDOSCOPY N/A 01/17/2018   Procedure: FLEXIBLE SIGMOIDOSCOPY;  Surgeon: Ileana Roup, MD;  [facility];  Service: General;  Laterality: N/A;  . HERNIA REPAIR     as a baby  . ILEOSTOMY N/A 01/17/2018   Procedure: possible diverting LOOP ILEOSTOMY;  Surgeon: Ileana Roup, MD;  [facility];  Service: General;  Laterality: N/A;  . PORT-A-CATH REMOVAL Right 02/17/2018   Procedure: REMOVAL OF PORT-A-CATH;  Surgeon: Ileana Roup, MD;  [facility];  Service: General;  Laterality: Right;  . PORTACATH PLACEMENT N/A 06/17/2017   Procedure: RIGHT VS  LEFT INTERNAL JUGULAR PORT-A-CATH PLACEMENT WITH ULTRASOUND;  Surgeon: Ileana Roup, MD;  [facility];  Service: General;  Laterality: N/A;  FLURO    There were no vitals filed for this visit.  Subjective Assessment - 04/08/19 1233    Subjective  Pt states shoulder is about the same.    How long can you sit comfortably?  4 hours and then I need to take medicine, but always hurts    Patient Stated Goals  reduce pain    Currently in Pain?  Yes    Pain Score  7     Pain Location  Neck    Pain Orientation  Left    Pain Descriptors / Indicators  Aching;Numbness    Pain Type  Chronic pain    Pain Radiating Towards  numb into the arm    Aggravating Factors   working and driving    Multiple Pain Sites  No                       [facility] Adult PT Treatment/Exercise - 04/08/19 0001      Exercises   Exercises  Neck      Neck Exercises: Standing   Other Standing Exercises  foam roll behind - scap squeezes 5 sec hold x 10; yellow band ER 10x      Neck Exercises: Seated  Cervical Rotation  Right;Left;5 reps      Neck Exercises: Supine   Other Supine Exercise  throracic ext with towel roll 10x 5 sec      Manual Therapy   Manual Therapy  Soft tissue mobilization    Soft tissue mobilization  skilled palpation and STM to cervical praspinals, suboccipitals, upper trap, , throracic para, rhomboids       Trigger Point Dry Needling - 04/08/19 0001    Consent Given?  Yes    Education Handout Provided  Previously provided   reviewed verbally   Muscles Treated Head and Neck  Upper trapezius;Cervical multifidi    Muscles Treated Upper Quadrant  Rhomboids    Upper Trapezius Response  Twitch reponse elicited;Palpable increased muscle length   Lt   Cervical multifidi Response  Palpable increased muscle length   Lt   Rhomboids Response  Palpable increased muscle length   Lt          PT Education - 04/08/19 1325    Education Details  Access Code: [email or web address]        PT Short Term Goals - 04/03/19 1002      PT SHORT TERM GOAL #1   Title  Pt will report 25% less neck pain.    Time  4    Period  Weeks    Status  New    Target Date  04/30/19      PT SHORT TERM GOAL #2   Title  ind with intial HEP    Time  4    Period  Weeks    Status  New    Target Date  04/30/19        PT Long Term Goals - 04/03/19 0946      PT LONG TERM GOAL #1   Title  Pt will report at least 60% less pain when working on the computer and driving    Time  12    Period  Weeks    Status  New    Target Date  06/25/19      PT LONG TERM GOAL #2   Title  Pt will demonstrate 4+/5 MMT for scapular stabilizers    Baseline  4/5    Time  12    Period  Weeks    Status  New    Target Date  06/25/19      PT LONG TERM GOAL #3   Title  Pt will be I and compliant with HEP.    Time  12    Period  Weeks    Status  New    Target Date  06/25/19      PT LONG TERM GOAL #4   Title  Pt will demonstrate cervical rotation of at least 65 deg bilaterally without pain for improved driving    Time  12    Period  Weeks    Status  New    Target Date  06/25/19      PT LONG TERM GOAL #5   Title  FOTO < or = to 30% limited    Baseline  49%    Time  12    Period  Weeks    Status  New    Target Date  06/25/19            Plan - 04/08/19 1312    Clinical Impression Statement  Pt responded well to exercises and manual techniques.  He felt more mobilty and less pain at the end of  the treatment.  Pt has a lot of tension in periscapular and Cspine on the Lt side.  Unable to DN to subscapularis because PT could not elevat the scapula.  Pt was given addition to HEP.  Continue to progress according to POC    PT Treatment/Interventions  Taping;Dry needling;Passive range of motion;Manual techniques;Patient/family education;Therapeutic activities;Therapeutic exercise;Neuromuscular re-education;Electrical Stimulation;Cryotherapy;Biofeedback;ADLs/Self Care Home Management;Iontophoresis  4mg /ml Dexamethasone;Ultrasound    PT Next Visit Plan  f/u on DN to cerivical paraspinals; cervical ROM; thoracic extension; scapular strength progress as tolerated    PT Home Exercise Plan  Access Code: [email or web address]    Consulted and Agree with Plan of Care  Patient       Patient will benefit from skilled therapeutic intervention in order to improve the following deficits and impairments:  Postural dysfunction, Decreased strength, Pain, Increased muscle spasms, Decreased range of motion  Visit Diagnosis: Cervicalgia  Abnormal posture  Muscle weakness (generalized)  Other muscle spasm  Chronic left shoulder pain     Problem List Patient Active Problem List   Diagnosis Date Noted  . Insulin-requiring or dependent type II diabetes mellitus ([place])   . Hyperlipidemia   . Neuropathy   . Genetic testing 08/27/2017  . Family history of colon cancer   . Family history of uterine cancer   . Family history of stomach cancer   . Family history of prostate cancer   . Port-A-Cath in place 06/20/2017  . Rectal cancer s/p robotic LAR rectosigmoid resection 01/17/2018 06/12/2017  . Other and unspecified hyperlipidemia 06/19/2013    Jule Ser, PT 04/08/2019, 2:13 PM  [facility] [address] [place], [place], [address] Phone: [phone]   Fax:  [phone]  Name: Charles [name] MRN: [id number] Date of Birth: December 13, 1965

## 2019-04-08 NOTE — Patient Instructions (Signed)
Access Code: [email or web address] URL: [email or web address] Date: 04/08/2019 Prepared by: Jari Favre  Exercises Lovena Neighbours - 1 x daily - 7 x weekly - 10 reps - 3 sets Thoracic Extension Mobilization with Noodle - 1 x daily - 7 x weekly - 1 sets - 10 reps - 5 sec hold Seated Shoulder W External Rotation on Swiss Ball - 1 x daily - 7 x weekly - 10 reps - 3 sets Seated Cervical Rotation AROM - 1 x daily - 7 x weekly - 1 sets - 10 reps - 5 hold  Patient Education Shoulder Impingement

## 2019-04-16 ENCOUNTER — Other Ambulatory Visit: Payer: Self-pay

## 2019-04-16 ENCOUNTER — Encounter: Payer: Self-pay | Admitting: Physical Therapy

## 2019-04-16 ENCOUNTER — Ambulatory Visit: Payer: 59 | Attending: Orthopedic Surgery | Admitting: Physical Therapy

## 2019-04-16 DIAGNOSIS — M25512 Pain in left shoulder: Secondary | ICD-10-CM | POA: Insufficient documentation

## 2019-04-16 DIAGNOSIS — G8929 Other chronic pain: Secondary | ICD-10-CM | POA: Insufficient documentation

## 2019-04-16 DIAGNOSIS — M62838 Other muscle spasm: Secondary | ICD-10-CM | POA: Insufficient documentation

## 2019-04-16 DIAGNOSIS — M542 Cervicalgia: Secondary | ICD-10-CM | POA: Insufficient documentation

## 2019-04-16 DIAGNOSIS — R293 Abnormal posture: Secondary | ICD-10-CM | POA: Insufficient documentation

## 2019-04-16 DIAGNOSIS — M6281 Muscle weakness (generalized): Secondary | ICD-10-CM | POA: Diagnosis present

## 2019-04-16 NOTE — Therapy (Signed)
[facility] [address] [place], [place], [address] Phone: [phone]   Fax:  [phone]  Physical Therapy Treatment  Patient Details  Name: Charles [name] MRN: [id number] Date of Birth: 1965-05-21 Referring Provider (PT): Joanell Rising, [facility]   Encounter Date: 04/16/2019  PT End of Session - 04/16/19 0821    Visit Number  3    Date for PT Re-Evaluation  06/25/19    PT Start Time  0804    PT Stop Time  [id number]    PT Time Calculation (min)  40 min    Activity Tolerance  Patient tolerated treatment well    Behavior During Therapy  [facility] for tasks assessed/performed       Past Medical History:  Diagnosis Date  . Diabetes mellitus without complication ([place])    Type II  . Family history of colon cancer   . Family history of prostate cancer   . Family history of stomach cancer   . Family history of uterine cancer   . History of chemotherapy LAST CHEMO NOV 2019  . Hyperlipidemia   . Neuropathy    FEET AND HANDS PAIN IN LEGS AT TIMES    Past Surgical History:  Procedure Laterality Date  . ACHILLES TENDON REPAIR Right 2005  . APPENDECTOMY    . CYSTOSCOPY WITH STENT PLACEMENT Bilateral 01/17/2018   Procedure: CYSTOSCOPY WITH STENT PLACEMENT;  Surgeon: Kathie Rhodes, MD;  [facility];  Service: Urology;  Laterality: Bilateral;  . FLEXIBLE SIGMOIDOSCOPY N/A 01/17/2018   Procedure: FLEXIBLE SIGMOIDOSCOPY;  Surgeon: Ileana Roup, MD;  [facility];  Service: General;  Laterality: N/A;  . HERNIA REPAIR     as a baby  . ILEOSTOMY N/A 01/17/2018   Procedure: possible diverting LOOP ILEOSTOMY;  Surgeon: Ileana Roup, MD;  [facility];  Service: General;  Laterality: N/A;  . PORT-A-CATH REMOVAL Right 02/17/2018   Procedure: REMOVAL OF PORT-A-CATH;  Surgeon: Ileana Roup, MD;  [facility];  Service: General;  Laterality: Right;  . PORTACATH PLACEMENT N/A 06/17/2017   Procedure: RIGHT VS  LEFT INTERNAL JUGULAR PORT-A-CATH PLACEMENT WITH ULTRASOUND;  Surgeon: Ileana Roup, MD;  [facility];  Service: General;  Laterality: N/A;  FLURO    There were no vitals filed for this visit.  Subjective Assessment - 04/16/19 0807    Subjective  Pt states that the pain and numbness is still there.  It felt better after dry needling and the massage he had    Patient Stated Goals  reduce pain    Currently in Pain?  Yes    Pain Score  7     Pain Location  Neck    Pain Orientation  Left    Pain Descriptors / Indicators  Sore;Numbness    Pain Type  Chronic pain    Pain Radiating Towards  down left arm on the side of upper arm    Pain Onset  1 to 4 weeks ago    Pain Frequency  Constant    Aggravating Factors   reaching forward    Multiple Pain Sites  No                       [facility] Adult PT Treatment/Exercise - 04/16/19 0001      Neck Exercises: Theraband   Shoulder Extension  20 reps   yellow band   Other Theraband Exercises  standing foam behind - cervical retraction into pillow;  yellow band ER and diagonals      Neck Exercises: Supine   Other Supine Exercise  goal post stretch      Manual Therapy   Soft tissue mobilization  skilled palpation and STM to cervical praspinals, suboccipitals, upper trap, , throracic para, rhomboids       Trigger Point Dry Needling - 04/16/19 0001    Consent Given?  Yes    Education Handout Provided  Previously provided    Upper Trapezius Response  Twitch reponse elicited;Palpable increased muscle length   Lt   Cervical multifidi Response  Palpable increased muscle length   Lt          PT Education - 04/16/19 0850    Education Details  Access Code: [employer]    Person(s) Educated  Patient    Methods  Explanation;Demonstration;Handout;Verbal cues    Comprehension  Verbalized understanding;Returned demonstration       PT Short Term Goals - 04/16/19 0853      PT SHORT TERM GOAL #1   Title  Pt will report  25% less neck pain.    Baseline  7/10    Status  On-going      PT SHORT TERM GOAL #2   Title  ind with intial HEP    Status  Achieved        PT Long Term Goals - 04/03/19 0946      PT LONG TERM GOAL #1   Title  Pt will report at least 60% less pain when working on the computer and driving    Time  12    Period  Weeks    Status  New    Target Date  06/25/19      PT LONG TERM GOAL #2   Title  Pt will demonstrate 4+/5 MMT for scapular stabilizers    Baseline  4/5    Time  12    Period  Weeks    Status  New    Target Date  06/25/19      PT LONG TERM GOAL #3   Title  Pt will be I and compliant with HEP.    Time  12    Period  Weeks    Status  New    Target Date  06/25/19      PT LONG TERM GOAL #4   Title  Pt will demonstrate cervical rotation of at least 65 deg bilaterally without pain for improved driving    Time  12    Period  Weeks    Status  New    Target Date  06/25/19      PT LONG TERM GOAL #5   Title  FOTO < or = to 30% limited    Baseline  49%    Time  12    Period  Weeks    Status  New    Target Date  06/25/19            Plan - 04/16/19 [id number]    Clinical Impression Statement  Pt had good response from DN and manual treatment last visit.  He continue to have tension throughout cervical musculature and another treatment was done for this.  Pt will benefit from improved scapular strength and postural strength    Personal Factors and Comorbidities  Time since onset of injury/illness/exacerbation    PT Treatment/Interventions  Taping;Dry needling;Passive range of motion;Manual techniques;Patient/family education;Therapeutic activities;Therapeutic exercise;Neuromuscular re-education;Electrical Stimulation;Cryotherapy;Biofeedback;ADLs/Self Care Home Management;Iontophoresis 4mg /ml Dexamethasone;Ultrasound    PT Next Visit Plan  f/u on DN to cerivical paraspinals; scapular strength and pec stretches; progress as tolerated scap stab with overhead reaching    PT  Home Exercise Plan  Access Code: [email or web address]    Consulted and Agree with Plan of Care  Patient       Patient will benefit from skilled therapeutic intervention in order to improve the following deficits and impairments:  Postural dysfunction, Decreased strength, Pain, Increased muscle spasms, Decreased range of motion  Visit Diagnosis: Cervicalgia  Abnormal posture  Muscle weakness (generalized)  Other muscle spasm  Chronic left shoulder pain     Problem List Patient Active Problem List   Diagnosis Date Noted  . Insulin-requiring or dependent type II diabetes mellitus ([place])   . Hyperlipidemia   . Neuropathy   . Genetic testing 08/27/2017  . Family history of colon cancer   . Family history of uterine cancer   . Family history of stomach cancer   . Family history of prostate cancer   . Port-A-Cath in place 06/20/2017  . Rectal cancer s/p robotic LAR rectosigmoid resection 01/17/2018 06/12/2017  . Other and unspecified hyperlipidemia 06/19/2013    Jule Ser, PT 04/16/2019, 8:53 AM  [facility] [address] [place], [place], [address] Phone: [phone]   Fax:  [phone]  Name: Keion Criner MRN: [id number] Date of Birth: September 03, 1965

## 2019-04-16 NOTE — Patient Instructions (Signed)
Access Code: [email or web address] URL: [email or web address] Date: 04/16/2019 Prepared by: Jari Favre  Exercises Charles [name] - 1 x daily - 7 x weekly - 10 reps - 3 sets Thoracic Extension Mobilization with Noodle - 1 x daily - 7 x weekly - 1 sets - 10 reps - 5 sec hold Seated Shoulder W External Rotation on Swiss Ball - 1 x daily - 7 x weekly - 10 reps - 3 sets Seated Cervical Rotation AROM - 1 x daily - 7 x weekly - 1 sets - 10 reps - 5 hold Open Book Chest Stretch on Towel Roll - 1 x daily - 7 x weekly - 1 sets - 5 reps - 20 hold  Patient Education Shoulder Impingement

## 2019-04-20 ENCOUNTER — Ambulatory Visit: Payer: 59 | Admitting: Physical Therapy

## 2019-04-20 ENCOUNTER — Other Ambulatory Visit: Payer: Self-pay

## 2019-04-20 DIAGNOSIS — R293 Abnormal posture: Secondary | ICD-10-CM

## 2019-04-20 DIAGNOSIS — M542 Cervicalgia: Secondary | ICD-10-CM

## 2019-04-20 DIAGNOSIS — M6281 Muscle weakness (generalized): Secondary | ICD-10-CM

## 2019-04-20 NOTE — Therapy (Signed)
[facility] [address] [place], [place], [address] Phone: [phone]   Fax:  [phone]  Physical Therapy Treatment  Patient Details  Name: Charles [name] MRN: [id number] Date of Birth: 05/06/1965 Referring Provider (PT): Joanell Rising, [facility]   Encounter Date: 04/20/2019  PT End of Session - 04/20/19 1628    Visit Number  4    Date for PT Re-Evaluation  06/25/19    PT Start Time  1620    PT Stop Time  1703    PT Time Calculation (min)  43 min    Activity Tolerance  Patient tolerated treatment well    Behavior During Therapy  [facility] for tasks assessed/performed       Past Medical History:  Diagnosis Date  . Diabetes mellitus without complication ([place])    Type II  . Family history of colon cancer   . Family history of prostate cancer   . Family history of stomach cancer   . Family history of uterine cancer   . History of chemotherapy LAST CHEMO NOV 2019  . Hyperlipidemia   . Neuropathy    FEET AND HANDS PAIN IN LEGS AT TIMES    Past Surgical History:  Procedure Laterality Date  . ACHILLES TENDON REPAIR Right 2005  . APPENDECTOMY    . CYSTOSCOPY WITH STENT PLACEMENT Bilateral 01/17/2018   Procedure: CYSTOSCOPY WITH STENT PLACEMENT;  Surgeon: Kathie Rhodes, MD;  [facility];  Service: Urology;  Laterality: Bilateral;  . FLEXIBLE SIGMOIDOSCOPY N/A 01/17/2018   Procedure: FLEXIBLE SIGMOIDOSCOPY;  Surgeon: Ileana Roup, MD;  [facility];  Service: General;  Laterality: N/A;  . HERNIA REPAIR     as a baby  . ILEOSTOMY N/A 01/17/2018   Procedure: possible diverting LOOP ILEOSTOMY;  Surgeon: Ileana Roup, MD;  [facility];  Service: General;  Laterality: N/A;  . PORT-A-CATH REMOVAL Right 02/17/2018   Procedure: REMOVAL OF PORT-A-CATH;  Surgeon: Ileana Roup, MD;  [facility];  Service: General;  Laterality: Right;  . PORTACATH PLACEMENT N/A 06/17/2017   Procedure: RIGHT VS  LEFT INTERNAL JUGULAR PORT-A-CATH PLACEMENT WITH ULTRASOUND;  Surgeon: Ileana Roup, MD;  [facility];  Service: General;  Laterality: N/A;  FLURO    There were no vitals filed for this visit.  Subjective Assessment - 04/20/19 1629    Subjective  Pt reports no change in Lt arm pain and numbness. His desk riser should be arriving soon.  After this week, he will have a break from work. He's hoping symptoms will resolve then.    Currently in Pain?  Yes    Pain Score  8     Pain Location  Shoulder    Pain Orientation  Left    Pain Descriptors / Indicators  Sore;Numbness    Pain Radiating Towards  up into neck and down to elbow    Aggravating Factors   reaching forward (typing at computer- arms flexed to 90 deg)    Pain Relieving Factors  OTC medicine         [facility] PT Assessment - 04/20/19 0001      Assessment   Medical Diagnosis  cervicalgia    Referring Provider (PT)  Joanell Rising, [facility]    Onset Date/Surgical Date  --   several months - worsening   Prior Therapy  No        [facility] Adult PT Treatment/Exercise - 04/20/19 0001      Self-Care  Self-Care  Other Self-Care Comments;Posture    Posture  Pt educated on posture, work station set up, and how each of these affect his pain; demo provided. Pt verbalized understanding.     Other Self-Care Comments   Pt educated regarding self massage with theracane and ball to neck/shoulder/pec musculature; pt returned demo with cues.   Pt educated on rationale of TENS unit - pt encouraged to get clearance from oncologist prior to buying/using unit.       Neck Exercises: Machines for Strengthening   UBE (Upper Arm Bike)  L2: 1 min forward, 1 min backward (seated)      Neck Exercises: Standing   Other Standing Exercises  pool noodle behind back: scap retraction x 5 sec x 10 reps (tactile cues to not elevate), L's x 5 reps of 5 sec hold; bilat shoulder ER with yellow band x 10      Neck Exercises: Seated   Cervical Rotation   Right;Left;5 reps   seated at edge of chair with upright posture.    Lateral Flexion  Right;Left;5 reps   seated at edge of chair with upright posture.    Other Seated Exercise  thoracic ext over back of chair, with hands supporting head x 4 reps of 5 sec       Neck Exercises: Supine   Other Supine Exercise  thoracic ext with towel roll perpendicular to spine (around T9) x 10 sec (limited tolerance)      Neck Exercises: Stretches   Levator Stretch  Left;Right;1 rep;10 seconds   cues to relax shoulder   Other Neck Stretches  3 position doorway stretch x 20 sec each position, high position (120 deg) tolerated better as unilateral.                PT Short Term Goals - 04/16/19 0853      PT SHORT TERM GOAL #1   Title  Pt will report 25% less neck pain.    Baseline  7/10    Status  On-going      PT SHORT TERM GOAL #2   Title  ind with intial HEP    Status  Achieved        PT Long Term Goals - 04/03/19 0946      PT LONG TERM GOAL #1   Title  Pt will report at least 60% less pain when working on the computer and driving    Time  12    Period  Weeks    Status  New    Target Date  06/25/19      PT LONG TERM GOAL #2   Title  Pt will demonstrate 4+/5 MMT for scapular stabilizers    Baseline  4/5    Time  12    Period  Weeks    Status  New    Target Date  06/25/19      PT LONG TERM GOAL #3   Title  Pt will be I and compliant with HEP.    Time  12    Period  Weeks    Status  New    Target Date  06/25/19      PT LONG TERM GOAL #4   Title  Pt will demonstrate cervical rotation of at least 65 deg bilaterally without pain for improved driving    Time  12    Period  Weeks    Status  New    Target Date  06/25/19  PT LONG TERM GOAL #5   Title  FOTO < or = to 30% limited    Baseline  49%    Time  12    Period  Weeks    Status  New    Target Date  06/25/19            Plan - 04/20/19 1716    Clinical Impression Statement  Pt continues with elevated  pain level from Lt upper trap into Lt arm to elbow.  Pt reported slight reduction of pain in shoulder with exercises, but numbness remained unchanged. Pt had limited tolerance for supine thoracic ext stretch; improved tolerance in seated position.  Session focused on self care, posture and stretches.  Pt making gradual progress towards LTGs.    Personal Factors and Comorbidities  Time since onset of injury/illness/exacerbation    Rehab Potential  Excellent    PT Frequency  2x / week    PT Duration  12 weeks    PT Treatment/Interventions  Taping;Dry needling;Passive range of motion;Manual techniques;Patient/family education;Therapeutic activities;Therapeutic exercise;Neuromuscular re-education;Electrical Stimulation;Cryotherapy;Biofeedback;ADLs/Self Care Home Management;Iontophoresis 4mg /ml Dexamethasone;Ultrasound    PT Next Visit Plan  continue scapular strength and pec stretches; progress as tolerated scap stab with overhead reaching    PT Home Exercise Plan  Access Code: [email or web address]    Consulted and Agree with Plan of Care  Patient       Patient will benefit from skilled therapeutic intervention in order to improve the following deficits and impairments:  Postural dysfunction, Decreased strength, Pain, Increased muscle spasms, Decreased range of motion  Visit Diagnosis: Cervicalgia  Abnormal posture  Muscle weakness (generalized)     Problem List Patient Active Problem List   Diagnosis Date Noted  . Insulin-requiring or dependent type II diabetes mellitus ([place])   . Hyperlipidemia   . Neuropathy   . Genetic testing 08/27/2017  . Family history of colon cancer   . Family history of uterine cancer   . Family history of stomach cancer   . Family history of prostate cancer   . Port-A-Cath in place 06/20/2017  . Rectal cancer s/p robotic LAR rectosigmoid resection 01/17/2018 06/12/2017  . Other and unspecified hyperlipidemia 06/19/2013   Kerin Perna, PTA 04/20/19 5:21  PM  [facility] [address], [place], [place], [address] Phone: [phone]   Fax:  [phone]  Name: Charles [name] MRN: [id number] Date of Birth: Jan 10, 1965

## 2019-04-23 ENCOUNTER — Other Ambulatory Visit: Payer: Self-pay

## 2019-04-23 ENCOUNTER — Ambulatory Visit: Payer: 59 | Admitting: Physical Therapy

## 2019-04-23 ENCOUNTER — Encounter: Payer: Self-pay | Admitting: Physical Therapy

## 2019-04-23 DIAGNOSIS — G8929 Other chronic pain: Secondary | ICD-10-CM

## 2019-04-23 DIAGNOSIS — M6281 Muscle weakness (generalized): Secondary | ICD-10-CM

## 2019-04-23 DIAGNOSIS — M542 Cervicalgia: Secondary | ICD-10-CM | POA: Diagnosis not present

## 2019-04-23 DIAGNOSIS — R293 Abnormal posture: Secondary | ICD-10-CM

## 2019-04-23 DIAGNOSIS — M62838 Other muscle spasm: Secondary | ICD-10-CM

## 2019-04-23 NOTE — Therapy (Signed)
[facility] [address], [place], [place], [address] Phone: [phone]   Fax:  [phone]  Physical Therapy Treatment  Patient Details  Name: Charles [name] MRN: [id number] Date of Birth: 03-26-1965 Referring Provider (PT): Joanell Rising, [facility]   Encounter Date: 04/23/2019  PT End of Session - 04/23/19 0805    Visit Number  5    Date for PT Re-Evaluation  06/25/19    PT Start Time  0806    PT Stop Time  0846    PT Time Calculation (min)  40 min    Activity Tolerance  Patient tolerated treatment well    Behavior During Therapy  [facility] for tasks assessed/performed       Past Medical History:  Diagnosis Date  . Diabetes mellitus without complication ([place])    Type II  . Family history of colon cancer   . Family history of prostate cancer   . Family history of stomach cancer   . Family history of uterine cancer   . History of chemotherapy LAST CHEMO NOV 2019  . Hyperlipidemia   . Neuropathy    FEET AND HANDS PAIN IN LEGS AT TIMES    Past Surgical History:  Procedure Laterality Date  . ACHILLES TENDON REPAIR Right 2005  . APPENDECTOMY    . CYSTOSCOPY WITH STENT PLACEMENT Bilateral 01/17/2018   Procedure: CYSTOSCOPY WITH STENT PLACEMENT;  Surgeon: Kathie Rhodes, MD;  [facility]: [facility];  Service: Urology;  Laterality: Bilateral;  . FLEXIBLE SIGMOIDOSCOPY N/A 01/17/2018   Procedure: FLEXIBLE SIGMOIDOSCOPY;  Surgeon: Ileana Roup, MD;  [facility];  Service: General;  Laterality: N/A;  . HERNIA REPAIR     as a baby  . ILEOSTOMY N/A 01/17/2018   Procedure: possible diverting LOOP ILEOSTOMY;  Surgeon: Ileana Roup, MD;  [facility];  Service: General;  Laterality: N/A;  . PORT-A-CATH REMOVAL Right 02/17/2018   Procedure: REMOVAL OF PORT-A-CATH;  Surgeon: Ileana Roup, MD;  [facility];  Service: General;  Laterality: Right;  . PORTACATH PLACEMENT N/A 06/17/2017   Procedure: RIGHT VS  LEFT INTERNAL JUGULAR PORT-A-CATH PLACEMENT WITH ULTRASOUND;  Surgeon: Ileana Roup, MD;  [facility];  Service: General;  Laterality: N/A;  FLURO    There were no vitals filed for this visit.  Subjective Assessment - 04/23/19 0807    Subjective  Pt states it is hurting all the time now.  The whole back and arm gets numb    Patient Stated Goals  reduce pain    Currently in Pain?  Yes    Pain Score  8     Pain Location  Shoulder    Pain Orientation  Left    Pain Descriptors / Indicators  Sore;Numbness    Pain Type  Chronic pain    Pain Radiating Towards  shoulder blade down the arm    Multiple Pain Sites  No                       [facility] Adult PT Treatment/Exercise - 04/23/19 0001      Self-Care   Other Self-Care Comments   review posture; educated on posture correction brace      Neck Exercises: Machines for Strengthening   UBE (Upper Arm Bike)  L1: 2 min forward, 2 min backward (seated)      Modalities   Modalities  Electrical Stimulation;Moist Heat      Moist Heat Therapy   Number  Minutes Moist Heat  15 Minutes    Moist Heat Location  Shoulder      Electrical Stimulation   Electrical Stimulation Location  Lt rhomboids    Electrical Stimulation Action  IFC    Electrical Stimulation Parameters  to tolerance x 13 min    Electrical Stimulation Goals  Pain      Manual Therapy   Soft tissue mobilization  (left side) rhomboids; levator; traps; RTC       Trigger Point Dry Needling - 04/23/19 0001    Consent Given?  Yes    Education Handout Provided  Previously provided    Muscles Treated Head and Neck  Levator scapulae    Upper Trapezius Response  Palpable increased muscle length    Levator Scapulae Response  Palpable increased muscle length    Rhomboids Response  Palpable increased muscle length           PT Education - 04/23/19 0840    Education Details  educated in Nurse, mental health to wear when sitting long periods of time for  temporary pain relief    Person(s) Educated  Patient    Methods  Explanation;Demonstration;Tactile cues;Verbal cues;Handout    Comprehension  Verbalized understanding;Returned demonstration       PT Short Term Goals - 04/16/19 0853      PT SHORT TERM GOAL #1   Title  Pt will report 25% less neck pain.    Baseline  7/10    Status  On-going      PT SHORT TERM GOAL #2   Title  ind with intial HEP    Status  Achieved        PT Long Term Goals - 04/03/19 0946      PT LONG TERM GOAL #1   Title  Pt will report at least 60% less pain when working on the computer and driving    Time  12    Period  Weeks    Status  New    Target Date  06/25/19      PT LONG TERM GOAL #2   Title  Pt will demonstrate 4+/5 MMT for scapular stabilizers    Baseline  4/5    Time  12    Period  Weeks    Status  New    Target Date  06/25/19      PT LONG TERM GOAL #3   Title  Pt will be I and compliant with HEP.    Time  12    Period  Weeks    Status  New    Target Date  06/25/19      PT LONG TERM GOAL #4   Title  Pt will demonstrate cervical rotation of at least 65 deg bilaterally without pain for improved driving    Time  12    Period  Weeks    Status  New    Target Date  06/25/19      PT LONG TERM GOAL #5   Title  FOTO < or = to 30% limited    Baseline  49%    Time  12    Period  Weeks    Status  New    Target Date  06/25/19            Plan - 04/23/19 [id number]    Clinical Impression Statement  Today's session focused on pain relief due to increased pain over the last week.  Pt responded well to STM and dry needling ato  periscapular muscles and infraspinatus.  Pt did not have noticeable twitch response but did get muscle length.  Posture in sitting and review of posture on UBE; info given for posture corrector for temporary support when sitting long periods of time.    PT Treatment/Interventions  Taping;Dry needling;Passive range of motion;Manual techniques;Patient/family  education;Therapeutic activities;Therapeutic exercise;Neuromuscular re-education;Electrical Stimulation;Cryotherapy;Biofeedback;ADLs/Self Care Home Management;Iontophoresis 4mg /ml Dexamethasone;Ultrasound    PT Next Visit Plan  continue scapular strength and pec stretches; progress as tolerated scap stab with overhead reaching    PT Home Exercise Plan  Access Code: [email or web address]    Consulted and Agree with Plan of Care  Patient       Patient will benefit from skilled therapeutic intervention in order to improve the following deficits and impairments:  Postural dysfunction, Decreased strength, Pain, Increased muscle spasms, Decreased range of motion  Visit Diagnosis: Cervicalgia  Abnormal posture  Muscle weakness (generalized)  Other muscle spasm  Chronic left shoulder pain     Problem List Patient Active Problem List   Diagnosis Date Noted  . Insulin-requiring or dependent type II diabetes mellitus ([place])   . Hyperlipidemia   . Neuropathy   . Genetic testing 08/27/2017  . Family history of colon cancer   . Family history of uterine cancer   . Family history of stomach cancer   . Family history of prostate cancer   . Port-A-Cath in place 06/20/2017  . Rectal cancer s/p robotic LAR rectosigmoid resection 01/17/2018 06/12/2017  . Other and unspecified hyperlipidemia 06/19/2013    Jule Ser, PT 04/23/2019, 9:16 AM  [facility] [address] [place], [place], [address] Phone: [phone]   Fax:  [phone]  Name: Charles [name] MRN: [id number] Date of Birth: 02-23-1965

## 2019-04-27 ENCOUNTER — Other Ambulatory Visit: Payer: Self-pay

## 2019-04-27 ENCOUNTER — Ambulatory Visit: Payer: 59 | Admitting: Physical Therapy

## 2019-04-27 DIAGNOSIS — M542 Cervicalgia: Secondary | ICD-10-CM

## 2019-04-27 DIAGNOSIS — M6281 Muscle weakness (generalized): Secondary | ICD-10-CM

## 2019-04-27 DIAGNOSIS — R293 Abnormal posture: Secondary | ICD-10-CM

## 2019-04-27 DIAGNOSIS — M62838 Other muscle spasm: Secondary | ICD-10-CM

## 2019-04-27 NOTE — Therapy (Signed)
[facility] [address] [place], [place], [address] Phone: [phone]   Fax:  [phone]  Physical Therapy Treatment  Patient Details  Name: Charles [name] MRN: [id number] Date of Birth: [birth year] Referring Provider (PT): Joanell Rising, [facility]   Encounter Date: 04/27/2019  PT End of Session - 04/27/19 0939    Visit Number  6    Date for PT Re-Evaluation  06/25/19    PT Start Time  0931    PT Stop Time  1015    PT Time Calculation (min)  44 min    Activity Tolerance  Patient tolerated treatment well    Behavior During Therapy  [facility] for tasks assessed/performed       Past Medical History:  Diagnosis Date  . Diabetes mellitus without complication ([place])    Type II  . Family history of colon cancer   . Family history of prostate cancer   . Family history of stomach cancer   . Family history of uterine cancer   . History of chemotherapy LAST CHEMO NOV 2019  . Hyperlipidemia   . Neuropathy    FEET AND HANDS PAIN IN LEGS AT TIMES    Past Surgical History:  Procedure Laterality Date  . ACHILLES TENDON REPAIR Right 2005  . APPENDECTOMY    . CYSTOSCOPY WITH STENT PLACEMENT Bilateral 01/17/2018   Procedure: CYSTOSCOPY WITH STENT PLACEMENT;  Surgeon: Kathie Rhodes, MD;  [facility];  Service: Urology;  Laterality: Bilateral;  . FLEXIBLE SIGMOIDOSCOPY N/A 01/17/2018   Procedure: FLEXIBLE SIGMOIDOSCOPY;  Surgeon: Ileana Roup, MD;  [facility];  Service: General;  Laterality: N/A;  . HERNIA REPAIR     as a baby  . ILEOSTOMY N/A 01/17/2018   Procedure: possible diverting LOOP ILEOSTOMY;  Surgeon: Ileana Roup, MD;  [facility];  Service: General;  Laterality: N/A;  . PORT-A-CATH REMOVAL Right 02/17/2018   Procedure: REMOVAL OF PORT-A-CATH;  Surgeon: Ileana Roup, MD;  [facility];  Service: General;  Laterality: Right;  . PORTACATH PLACEMENT N/A 06/17/2017   Procedure: RIGHT VS  LEFT INTERNAL JUGULAR PORT-A-CATH PLACEMENT WITH ULTRASOUND;  Surgeon: Ileana Roup, MD;  [facility];  Service: General;  Laterality: N/A;  FLURO    There were no vitals filed for this visit.  Subjective Assessment - 04/27/19 0934    Subjective  Pt states last time the treatment helped a lot    Currently in Pain?  Yes    Pain Score  6     Pain Location  Shoulder    Pain Orientation  Left    Pain Descriptors / Indicators  Numbness;Sore    Pain Radiating Towards  Lt shoulder blade and down arm to digits 3-5    Pain Frequency  Intermittent    Multiple Pain Sites  No                       [facility] Adult PT Treatment/Exercise - 04/27/19 0001      Neck Exercises: Seated   Other Seated Exercise  thoracic ext over back of chair with ball behind, with hands supporting head x 4 reps of 5 sec     Other Seated Exercise  shoulder flexion ROM with pulleys cues to sit up tall      Neck Exercises: Supine   Other Supine Exercise  shoulder horizontal abduction and ER - green band - 20x      Lumbar Exercises: Supine  Bent Knee Raise  20 reps   2 sets   Bent Knee Raise Limitations  march with and without UE - 1 set each way      Moist Heat Therapy   Number Minutes Moist Heat  12 Minutes    Moist Heat Location  Shoulder      Electrical Stimulation   Electrical Stimulation Location  Lt rhomboids    Electrical Stimulation Action  IFC    Electrical Stimulation Parameters  to tolerance 54mA    Electrical Stimulation Goals  Pain      Manual Therapy   Soft tissue mobilization  (left side) rhomboids; levator; traps; RTC       Trigger Point Dry Needling - 04/27/19 0001    Consent Given?  Yes    Education Handout Provided  Previously provided    Upper Trapezius Response  Twitch reponse elicited;Palpable increased muscle length   Lt          PT Education - 04/27/19 1008    Education Details  Access Code: [email or web address]    Person(s) Educated  Patient    Methods   Explanation;Demonstration;Tactile cues;Verbal cues;Handout    Comprehension  Verbalized understanding;Returned demonstration       PT Short Term Goals - 04/16/19 0853      PT SHORT TERM GOAL #1   Title  Pt will report 25% less neck pain.    Baseline  7/10    Status  On-going      PT SHORT TERM GOAL #2   Title  ind with intial HEP    Status  Achieved        PT Long Term Goals - 04/03/19 0946      PT LONG TERM GOAL #1   Title  Pt will report at least 60% less pain when working on the computer and driving    Time  12    Period  Weeks    Status  New    Target Date  06/25/19      PT LONG TERM GOAL #2   Title  Pt will demonstrate 4+/5 MMT for scapular stabilizers    Baseline  4/5    Time  12    Period  Weeks    Status  New    Target Date  06/25/19      PT LONG TERM GOAL #3   Title  Pt will be I and compliant with HEP.    Time  12    Period  Weeks    Status  New    Target Date  06/25/19      PT LONG TERM GOAL #4   Title  Pt will demonstrate cervical rotation of at least 65 deg bilaterally without pain for improved driving    Time  12    Period  Weeks    Status  New    Target Date  06/25/19      PT LONG TERM GOAL #5   Title  FOTO < or = to 30% limited    Baseline  49%    Time  12    Period  Weeks    Status  New    Target Date  06/25/19            Plan - 04/27/19 1009    Clinical Impression Statement  Pt did well with adding back some strengthening exercises today and ending with manual and stim for pain relief.  Pt had several twitch responses and increased muscle length  throughout upper trap.  Pt was educated on ways to use theracane which he got for home use. No increased pain during treatment today and will benefit from strength progression for improved posture and core strength.    Personal Factors and Comorbidities  Time since onset of injury/illness/exacerbation    PT Treatment/Interventions  Taping;Dry needling;Passive range of motion;Manual  techniques;Patient/family education;Therapeutic activities;Therapeutic exercise;Neuromuscular re-education;Electrical Stimulation;Cryotherapy;Biofeedback;ADLs/Self Care Home Management;Iontophoresis 4mg /ml Dexamethasone;Ultrasound    PT Next Visit Plan  progress scap strength and shoulder stab with band to sitting and standing as able; pec stretches and posutre re-ed    PT Home Exercise Plan  Access Code: [email or web address]    Consulted and Agree with Plan of Care  Patient       Patient will benefit from skilled therapeutic intervention in order to improve the following deficits and impairments:  Postural dysfunction, Decreased strength, Pain, Increased muscle spasms, Decreased range of motion  Visit Diagnosis: Cervicalgia  Abnormal posture  Muscle weakness (generalized)  Other muscle spasm     Problem List Patient Active Problem List   Diagnosis Date Noted  . Insulin-requiring or dependent type II diabetes mellitus ([place])   . Hyperlipidemia   . Neuropathy   . Genetic testing 08/27/2017  . Family history of colon cancer   . Family history of uterine cancer   . Family history of stomach cancer   . Family history of prostate cancer   . Port-A-Cath in place 06/20/2017  . Rectal cancer s/p robotic LAR rectosigmoid resection 01/17/2018 06/12/2017  . Other and unspecified hyperlipidemia 06/19/2013    Jule Ser, PT 04/27/2019, 10:33 AM  [facility] [address], [place], [place], [address] Phone: [phone]   Fax:  [phone]  Name: Nixson Butta MRN: [id number] Date of Birth: [birth year]

## 2019-04-27 NOTE — Patient Instructions (Signed)
Access Code: [email or web address] URL: [email or web address] Date: 04/27/2019 Prepared by: Jari Favre  Exercises Charles [name] - 1 x daily - 7 x weekly - 10 reps - 3 sets Thoracic Extension Mobilization with Noodle - 1 x daily - 7 x weekly - 1 sets - 10 reps - 5 sec hold Seated Cervical Rotation AROM - 1 x daily - 7 x weekly - 1 sets - 10 reps - 5 hold Open Book Chest Stretch on Towel Roll - 1 x daily - 7 x weekly - 1 sets - 5 reps - 20 hold Seated Scapular Retraction - 3 x daily - 7 x weekly - 1 sets - 5 reps - 5 hold Doorway Pec Stretch at 120 Elevation with Arm Straight - 2 x daily - 7 x weekly - 1 sets - 2-3 reps - 15-20 hold Doorway Pec Stretch at 90 Degrees Abduction - 2 x daily - 7 x weekly - 1 sets - 2-3 reps - 15-20 hold Seated Thoracic Lumbar Extension with Pectoralis Stretch - 2 x daily - 7 x weekly - 1 sets - 2-3 reps - 5-10 hold Supine Bent Leg Lift with Arm Extension - 1 x daily - 7 x weekly - 10 reps - 3 sets Supine Shoulder Horizontal Abduction with Resistance - 1 x daily - 7 x weekly - 10 reps - 3 sets Supine Shoulder External Rotation with Resistance - 1 x daily - 7 x weekly - 10 reps - 3 sets  Patient Education Biomedical scientist

## 2019-05-04 ENCOUNTER — Ambulatory Visit: Payer: 59 | Admitting: Physical Therapy

## 2019-05-04 ENCOUNTER — Encounter: Payer: Self-pay | Admitting: Physical Therapy

## 2019-05-04 ENCOUNTER — Other Ambulatory Visit: Payer: Self-pay

## 2019-05-04 DIAGNOSIS — M62838 Other muscle spasm: Secondary | ICD-10-CM

## 2019-05-04 DIAGNOSIS — M542 Cervicalgia: Secondary | ICD-10-CM | POA: Diagnosis not present

## 2019-05-04 DIAGNOSIS — M25512 Pain in left shoulder: Secondary | ICD-10-CM

## 2019-05-04 DIAGNOSIS — G8929 Other chronic pain: Secondary | ICD-10-CM

## 2019-05-04 DIAGNOSIS — M6281 Muscle weakness (generalized): Secondary | ICD-10-CM

## 2019-05-04 DIAGNOSIS — R293 Abnormal posture: Secondary | ICD-10-CM

## 2019-05-04 NOTE — Therapy (Signed)
[facility] [address] [place], [place], [address] Phone: [phone]   Fax:  [phone]  Physical Therapy Treatment  Patient Details  Name: Charles [name] MRN: [id number] Date of Birth: [birth year] Referring Provider (PT): Joanell Rising, [facility]   Encounter Date: 05/04/2019  PT End of Session - 05/04/19 1352    Visit Number  7    Date for PT Re-Evaluation  06/25/19    PT Start Time  0931    PT Stop Time  1012    PT Time Calculation (min)  41 min    Activity Tolerance  Patient tolerated treatment well    Behavior During Therapy  [facility] for tasks assessed/performed       Past Medical History:  Diagnosis Date  . Diabetes mellitus without complication ([place])    Type II  . Family history of colon cancer   . Family history of prostate cancer   . Family history of stomach cancer   . Family history of uterine cancer   . History of chemotherapy LAST CHEMO NOV 2019  . Hyperlipidemia   . Neuropathy    FEET AND HANDS PAIN IN LEGS AT TIMES    Past Surgical History:  Procedure Laterality Date  . ACHILLES TENDON REPAIR Right 2005  . APPENDECTOMY    . CYSTOSCOPY WITH STENT PLACEMENT Bilateral 01/17/2018   Procedure: CYSTOSCOPY WITH STENT PLACEMENT;  Surgeon: Kathie Rhodes, MD;  [facility];  Service: Urology;  Laterality: Bilateral;  . FLEXIBLE SIGMOIDOSCOPY N/A 01/17/2018   Procedure: FLEXIBLE SIGMOIDOSCOPY;  Surgeon: Ileana Roup, MD;  [facility];  Service: General;  Laterality: N/A;  . HERNIA REPAIR     as a baby  . ILEOSTOMY N/A 01/17/2018   Procedure: possible diverting LOOP ILEOSTOMY;  Surgeon: Ileana Roup, MD;  [facility];  Service: General;  Laterality: N/A;  . PORT-A-CATH REMOVAL Right 02/17/2018   Procedure: REMOVAL OF PORT-A-CATH;  Surgeon: Ileana Roup, MD;  [facility];  Service: General;  Laterality: Right;  . PORTACATH PLACEMENT N/A 06/17/2017   Procedure: RIGHT VS  LEFT INTERNAL JUGULAR PORT-A-CATH PLACEMENT WITH ULTRASOUND;  Surgeon: Ileana Roup, MD;  [facility];  Service: General;  Laterality: N/A;  FLURO    There were no vitals filed for this visit.  Subjective Assessment - 05/04/19 1314    Subjective  The driving was bothering me and mostly numbness.  I also aggravated it when lifting a scooter up and down off the car.  Numbness is more the issue now than the pain.  Now, I can concentrate on my exercises.  Driving it gets numb very quickly, the exercises are not causing issues    Currently in Pain?  Yes   no number given for numbness              Nustep L3 x 8 min UE/LE #9 - PT present for status update Foam roller behind back - Ws, shoulder abd, scap squeezes - green band: 2x10  Manual therapy: STM and IASTM to upper traps and thoracic paraspinals, scalenes                   PT Short Term Goals - 04/16/19 0853      PT SHORT TERM GOAL #1   Title  Pt will report 25% less neck pain.    Baseline  7/10    Status  On-going      PT SHORT TERM GOAL #2  Title  ind with intial HEP    Status  Achieved        PT Long Term Goals - 04/03/19 0946      PT LONG TERM GOAL #1   Title  Pt will report at least 60% less pain when working on the computer and driving    Time  12    Period  Weeks    Status  New    Target Date  06/25/19      PT LONG TERM GOAL #2   Title  Pt will demonstrate 4+/5 MMT for scapular stabilizers    Baseline  4/5    Time  12    Period  Weeks    Status  New    Target Date  06/25/19      PT LONG TERM GOAL #3   Title  Pt will be I and compliant with HEP.    Time  12    Period  Weeks    Status  New    Target Date  06/25/19      PT LONG TERM GOAL #4   Title  Pt will demonstrate cervical rotation of at least 65 deg bilaterally without pain for improved driving    Time  12    Period  Weeks    Status  New    Target Date  06/25/19      PT LONG TERM GOAL #5   Title  FOTO < or =  to 30% limited    Baseline  49%    Time  12    Period  Weeks    Status  New    Target Date  06/25/19            Plan - 05/04/19 1356    Clinical Impression Statement  Pt needs cues to keep neck lengthened and chin tucked.  Less numbness with band exercises with improved cervical posture. Pt responded well to [facility] with thoracic mobs and cervical traction helping to reduce symptoms and increase joint mobility.  Continue to progress core and postural strength recommended    PT Treatment/Interventions  Taping;Dry needling;Passive range of motion;Manual techniques;Patient/family education;Therapeutic activities;Therapeutic exercise;Neuromuscular re-education;Electrical Stimulation;Cryotherapy;Biofeedback;ADLs/Self Care Home Management;Iontophoresis 4mg /ml Dexamethasone;Ultrasound    PT Next Visit Plan  may try Mechanical traction; UE movements with cues to demonstrate improved posture; progress scap strength and shoulder stab with band to sitting and standing as able; pec stretches and posutre re-ed    PT Home Exercise Plan  Access Code: [email or web address]    Consulted and Agree with Plan of Care  Patient       Patient will benefit from skilled therapeutic intervention in order to improve the following deficits and impairments:  Postural dysfunction, Decreased strength, Pain, Increased muscle spasms, Decreased range of motion  Visit Diagnosis: Cervicalgia  Abnormal posture  Muscle weakness (generalized)  Other muscle spasm  Chronic left shoulder pain     Problem List Patient Active Problem List   Diagnosis Date Noted  . Insulin-requiring or dependent type II diabetes mellitus ([place])   . Hyperlipidemia   . Neuropathy   . Genetic testing 08/27/2017  . Family history of colon cancer   . Family history of uterine cancer   . Family history of stomach cancer   . Family history of prostate cancer   . Port-A-Cath in place 06/20/2017  . Rectal cancer s/p robotic LAR rectosigmoid resection  01/17/2018 06/12/2017  . Other and unspecified hyperlipidemia 06/19/2013    Jule Ser, PT  05/04/2019, 1:59 PM  [facility] [address] [place], [place], [address] Phone: [phone]   Fax:  [phone]  Name: Sheraz Sachar MRN: [id number] Date of Birth: 01/19/1965

## 2019-05-07 ENCOUNTER — Other Ambulatory Visit: Payer: Self-pay

## 2019-05-07 ENCOUNTER — Ambulatory Visit: Payer: 59 | Admitting: Physical Therapy

## 2019-05-07 ENCOUNTER — Encounter: Payer: Self-pay | Admitting: Physical Therapy

## 2019-05-07 DIAGNOSIS — R293 Abnormal posture: Secondary | ICD-10-CM

## 2019-05-07 DIAGNOSIS — M6281 Muscle weakness (generalized): Secondary | ICD-10-CM

## 2019-05-07 DIAGNOSIS — M62838 Other muscle spasm: Secondary | ICD-10-CM

## 2019-05-07 DIAGNOSIS — M542 Cervicalgia: Secondary | ICD-10-CM

## 2019-05-07 DIAGNOSIS — G8929 Other chronic pain: Secondary | ICD-10-CM

## 2019-05-07 NOTE — Therapy (Signed)
[facility] [address], [place], [place], [address] Phone: [phone]   Fax:  [phone]  Physical Therapy Treatment  Patient Details  Name: Charles [name] MRN: [id number] Date of Birth: 04/14/1965 Referring Provider (PT): Joanell Rising, [facility]   Encounter Date: 05/07/2019  PT End of Session - 05/07/19 1102    Visit Number  8    Date for PT Re-Evaluation  06/25/19    PT Start Time  [id number]    PT Stop Time  1058    PT Time Calculation (min)  43 min    Activity Tolerance  Patient tolerated treatment well    Behavior During Therapy  [facility] for tasks assessed/performed       Past Medical History:  Diagnosis Date  . Diabetes mellitus without complication ([place])    Type II  . Family history of colon cancer   . Family history of prostate cancer   . Family history of stomach cancer   . Family history of uterine cancer   . History of chemotherapy LAST CHEMO NOV 2019  . Hyperlipidemia   . Neuropathy    FEET AND HANDS PAIN IN LEGS AT TIMES    Past Surgical History:  Procedure Laterality Date  . ACHILLES TENDON REPAIR Right 2005  . APPENDECTOMY    . CYSTOSCOPY WITH STENT PLACEMENT Bilateral 01/17/2018   Procedure: CYSTOSCOPY WITH STENT PLACEMENT;  Surgeon: Kathie Rhodes, MD;  [facility]: [facility];  Service: Urology;  Laterality: Bilateral;  . FLEXIBLE SIGMOIDOSCOPY N/A 01/17/2018   Procedure: FLEXIBLE SIGMOIDOSCOPY;  Surgeon: Ileana Roup, MD;  [facility];  Service: General;  Laterality: N/A;  . HERNIA REPAIR     as a baby  . ILEOSTOMY N/A 01/17/2018   Procedure: possible diverting LOOP ILEOSTOMY;  Surgeon: Ileana Roup, MD;  [facility];  Service: General;  Laterality: N/A;  . PORT-A-CATH REMOVAL Right 02/17/2018   Procedure: REMOVAL OF PORT-A-CATH;  Surgeon: Ileana Roup, MD;  [facility];  Service: General;  Laterality: Right;  . PORTACATH PLACEMENT N/A 06/17/2017   Procedure: RIGHT VS  LEFT INTERNAL JUGULAR PORT-A-CATH PLACEMENT WITH ULTRASOUND;  Surgeon: Ileana Roup, MD;  [facility];  Service: General;  Laterality: N/A;  FLURO    There were no vitals filed for this visit.  Subjective Assessment - 05/07/19 1103    Subjective  Pt continues to have numbness.  No new complaints today.    Currently in Pain?  No/denies                       [facility] Adult PT Treatment/Exercise - 05/07/19 0001      Neck Exercises: Machines for Strengthening   Nustep  L2 x 8 min - PT present for status update      Neck Exercises: Standing   Wall Push Ups  10 reps    Wall Push Ups Limitations  wall push up position cervical rotation and UE reaches - 10x each side    Other Standing Exercises  on foam roll - horizontal abduction; diagonals bilat; Ws, ER - 20x each    Other Standing Exercises  shoulder flexion with hands on scapula for upward rotation and decreased upper trap - 10x bilateral      Manual Therapy   Soft tissue mobilization  IASTM to upper trap and thoracic paraspinals and rhomboids Lt; STM to ant delts and pecs  PT Short Term Goals - 04/16/19 0853      PT SHORT TERM GOAL #1   Title  Pt will report 25% less neck pain.    Baseline  7/10    Status  On-going      PT SHORT TERM GOAL #2   Title  ind with intial HEP    Status  Achieved        PT Long Term Goals - 04/03/19 0946      PT LONG TERM GOAL #1   Title  Pt will report at least 60% less pain when working on the computer and driving    Time  12    Period  Weeks    Status  New    Target Date  06/25/19      PT LONG TERM GOAL #2   Title  Pt will demonstrate 4+/5 MMT for scapular stabilizers    Baseline  4/5    Time  12    Period  Weeks    Status  New    Target Date  06/25/19      PT LONG TERM GOAL #3   Title  Pt will be I and compliant with HEP.    Time  12    Period  Weeks    Status  New    Target Date  06/25/19      PT LONG TERM GOAL #4   Title  Pt  will demonstrate cervical rotation of at least 65 deg bilaterally without pain for improved driving    Time  12    Period  Weeks    Status  New    Target Date  06/25/19      PT LONG TERM GOAL #5   Title  FOTO < or = to 30% limited    Baseline  49%    Time  12    Period  Weeks    Status  New    Target Date  06/25/19            Plan - 05/07/19 1059    Clinical Impression Statement  Pt is making progress with band exercises in standing.  He continues to have numbness during treatment but was not increased Pt responded well to STM.  He has very tight deltoids and pectoralis muscles.  He continues to need cues for posture throughout and uses upper trap for shoulder flexion.  Pt will benefit from continued posutre strengthening.    PT Treatment/Interventions  Taping;Dry needling;Passive range of motion;Manual techniques;Patient/family education;Therapeutic activities;Therapeutic exercise;Neuromuscular re-education;Electrical Stimulation;Cryotherapy;Biofeedback;ADLs/Self Care Home Management;Iontophoresis 4mg /ml Dexamethasone;Ultrasound    PT Next Visit Plan  may try Mechanical traction; UE movements with cues to demonstrate improved posture; progress scap strength and shoulder stab with band to sitting and standing as able; pec stretches and posutre re-ed    PT Home Exercise Plan  Access Code: [email or web address]    Consulted and Agree with Plan of Care  Patient       Patient will benefit from skilled therapeutic intervention in order to improve the following deficits and impairments:  Postural dysfunction, Decreased strength, Pain, Increased muscle spasms, Decreased range of motion  Visit Diagnosis: Cervicalgia  Abnormal posture  Muscle weakness (generalized)  Other muscle spasm  Chronic left shoulder pain     Problem List Patient Active Problem List   Diagnosis Date Noted  . Insulin-requiring or dependent type II diabetes mellitus ([place])   . Hyperlipidemia   . Neuropathy   .  Genetic testing 08/27/2017  .  Family history of colon cancer   . Family history of uterine cancer   . Family history of stomach cancer   . Family history of prostate cancer   . Port-A-Cath in place 06/20/2017  . Rectal cancer s/p robotic LAR rectosigmoid resection 01/17/2018 06/12/2017  . Other and unspecified hyperlipidemia 06/19/2013    Jule Ser, PT 05/07/2019, 11:04 AM  [facility] [address] [place], [place], [address] Phone: [phone]   Fax:  [phone]  Name: Hallard Mccullar MRN: [id number] Date of Birth: [birth year]

## 2019-05-11 ENCOUNTER — Other Ambulatory Visit: Payer: Self-pay

## 2019-05-11 ENCOUNTER — Encounter: Payer: Self-pay | Admitting: Physical Therapy

## 2019-05-11 ENCOUNTER — Ambulatory Visit: Payer: 59 | Attending: Orthopedic Surgery | Admitting: Physical Therapy

## 2019-05-11 DIAGNOSIS — R293 Abnormal posture: Secondary | ICD-10-CM | POA: Insufficient documentation

## 2019-05-11 DIAGNOSIS — M542 Cervicalgia: Secondary | ICD-10-CM | POA: Insufficient documentation

## 2019-05-11 DIAGNOSIS — M6281 Muscle weakness (generalized): Secondary | ICD-10-CM | POA: Insufficient documentation

## 2019-05-11 DIAGNOSIS — M62838 Other muscle spasm: Secondary | ICD-10-CM | POA: Diagnosis present

## 2019-05-11 DIAGNOSIS — G8929 Other chronic pain: Secondary | ICD-10-CM | POA: Insufficient documentation

## 2019-05-11 DIAGNOSIS — M25512 Pain in left shoulder: Secondary | ICD-10-CM | POA: Diagnosis present

## 2019-05-11 NOTE — Patient Instructions (Signed)
Access Code: [email or web address] URL: [email or web address] Date: 05/11/2019 Prepared by: Jari Favre  Exercises Lovena Neighbours - 1 x daily - 7 x weekly - 10 reps - 3 sets Thoracic Extension Mobilization with Noodle - 1 x daily - 7 x weekly - 1 sets - 10 reps - 5 sec hold Seated Cervical Rotation AROM - 1 x daily - 7 x weekly - 1 sets - 10 reps - 5 hold Open Book Chest Stretch on Towel Roll - 1 x daily - 7 x weekly - 1 sets - 5 reps - 20 hold Seated Scapular Retraction - 3 x daily - 7 x weekly - 1 sets - 5 reps - 5 hold Doorway Pec Stretch at 120 Elevation with Arm Straight - 2 x daily - 7 x weekly - 1 sets - 2-3 reps - 15-20 hold Doorway Pec Stretch at 90 Degrees Abduction - 2 x daily - 7 x weekly - 1 sets - 2-3 reps - 15-20 hold Seated Thoracic Lumbar Extension with Pectoralis Stretch - 2 x daily - 7 x weekly - 1 sets - 2-3 reps - 5-10 hold Supine Bent Leg Lift with Arm Extension - 1 x daily - 7 x weekly - 10 reps - 3 sets Supine Shoulder Horizontal Abduction with Resistance - 1 x daily - 7 x weekly - 10 reps - 3 sets Supine Shoulder External Rotation with Resistance - 1 x daily - 7 x weekly - 10 reps - 3 sets Seated Passive Cervical Retraction - 1 x daily - 7 x weekly - 10 reps - 3 sets Seated Cervical Sidebending Stretch - 1 x daily - 7 x weekly - 10 reps - 3 sets  Patient Education Biomedical scientist

## 2019-05-11 NOTE — Therapy (Signed)
[facility] [address], [place], [place], [address] Phone: [phone]   Fax:  [phone]  Physical Therapy Treatment  Patient Details  Name: Charles [name] MRN: [id number] Date of Birth: May 10, 1965 Referring Provider (PT): Joanell Rising, [facility]   Encounter Date: 05/11/2019  PT End of Session - 05/11/19 0932    Visit Number  9    Date for PT Re-Evaluation  06/25/19    PT Start Time  0930    PT Stop Time  [id number]    PT Time Calculation (min)  45 min    Activity Tolerance  Patient tolerated treatment well    Behavior During Therapy  [facility] for tasks assessed/performed       Past Medical History:  Diagnosis Date  . Diabetes mellitus without complication ([place])    Type II  . Family history of colon cancer   . Family history of prostate cancer   . Family history of stomach cancer   . Family history of uterine cancer   . History of chemotherapy LAST CHEMO NOV 2019  . Hyperlipidemia   . Neuropathy    FEET AND HANDS PAIN IN LEGS AT TIMES    Past Surgical History:  Procedure Laterality Date  . ACHILLES TENDON REPAIR Right 2005  . APPENDECTOMY    . CYSTOSCOPY WITH STENT PLACEMENT Bilateral 01/17/2018   Procedure: CYSTOSCOPY WITH STENT PLACEMENT;  Surgeon: Kathie Rhodes, MD;  [facility];  Service: Urology;  Laterality: Bilateral;  . FLEXIBLE SIGMOIDOSCOPY N/A 01/17/2018   Procedure: FLEXIBLE SIGMOIDOSCOPY;  Surgeon: Ileana Roup, MD;  [facility];  Service: General;  Laterality: N/A;  . HERNIA REPAIR     as a baby  . ILEOSTOMY N/A 01/17/2018   Procedure: possible diverting LOOP ILEOSTOMY;  Surgeon: Ileana Roup, MD;  [facility];  Service: General;  Laterality: N/A;  . PORT-A-CATH REMOVAL Right 02/17/2018   Procedure: REMOVAL OF PORT-A-CATH;  Surgeon: Ileana Roup, MD;  [facility];  Service: General;  Laterality: Right;  . PORTACATH PLACEMENT N/A 06/17/2017   Procedure: RIGHT VS  LEFT INTERNAL JUGULAR PORT-A-CATH PLACEMENT WITH ULTRASOUND;  Surgeon: Ileana Roup, MD;  [facility];  Service: General;  Laterality: N/A;  FLURO    There were no vitals filed for this visit.  Subjective Assessment - 05/11/19 0933    Subjective  I have done a lot of work in the garage and now the whole back is bothering me not just that spot.  The numbness is less.  When I tilt my head back that is when I feel the most numbness in my                       [facility] Adult PT Treatment/Exercise - 05/11/19 0001      Neck Exercises: Standing   Wall Push Ups  15 reps    Wall Push Ups Limitations  cues to keep chin tucked and to not SB left    Other Standing Exercises  horizontal abduction, diagonals both ways in standing with chin tuck - 20x    Other Standing Exercises  upper trap and cervical retraction stretches - 20sec x 3, corner stretch 20 sec x 3      Lumbar Exercises: Standing   Row  Strengthening;Power tower;Both;20 reps    Row Limitations  20# - did 20 reps normal and 20 reps low row      Modalities   Modalities  Traction      Traction   Type of Traction  Cervical    Min (lbs)  5    Max (lbs)  13    Hold Time  60    Rest Time  10    Time  15 min             PT Education - 05/11/19 1020    Education Details  Access Code: [email or web address]    Person(s) Educated  Patient    Methods  Explanation;Demonstration;Verbal cues;Handout;Tactile cues    Comprehension  Verbalized understanding;Returned demonstration       PT Short Term Goals - 04/16/19 0853      PT SHORT TERM GOAL #1   Title  Pt will report 25% less neck pain.    Baseline  7/10    Status  On-going      PT SHORT TERM GOAL #2   Title  ind with intial HEP    Status  Achieved        PT Long Term Goals - 04/03/19 0946      PT LONG TERM GOAL #1   Title  Pt will report at least 60% less pain when working on the computer and driving    Time  12    Period  Weeks    Status  New     Target Date  06/25/19      PT LONG TERM GOAL #2   Title  Pt will demonstrate 4+/5 MMT for scapular stabilizers    Baseline  4/5    Time  12    Period  Weeks    Status  New    Target Date  06/25/19      PT LONG TERM GOAL #3   Title  Pt will be I and compliant with HEP.    Time  12    Period  Weeks    Status  New    Target Date  06/25/19      PT LONG TERM GOAL #4   Title  Pt will demonstrate cervical rotation of at least 65 deg bilaterally without pain for improved driving    Time  12    Period  Weeks    Status  New    Target Date  06/25/19      PT LONG TERM GOAL #5   Title  FOTO < or = to 30% limited    Baseline  49%    Time  12    Period  Weeks    Status  New    Target Date  06/25/19            Plan - 05/11/19 1007    Clinical Impression Statement  Pt is overall feeling better and able to do more physical activity with less pain.  He is anxious to feel 100% better, but seems to be making good progress.  Pt needs cues to keep head and neck in neutral alignment.  No increased pain or numbness assessed througout session.  Pt appears to respond well to cervical traction and will follow up next to see if we will continue with mechanical traction porgression.    PT Treatment/Interventions  Taping;Dry needling;Passive range of motion;Manual techniques;Patient/family education;Therapeutic activities;Therapeutic exercise;Neuromuscular re-education;Electrical Stimulation;Cryotherapy;Biofeedback;ADLs/Self Care Home Management;Iontophoresis 4mg /ml Dexamethasone;Ultrasound    PT Next Visit Plan  f/u on traction; progress scapular strength, cervical position; srengthening posutre    PT Home Exercise Plan  Access Code: [email or web address]    Consulted and Agree with Plan  of Care  Patient       Patient will benefit from skilled therapeutic intervention in order to improve the following deficits and impairments:  Postural dysfunction, Decreased strength, Pain, Increased muscle spasms, Decreased  range of motion  Visit Diagnosis: Cervicalgia  Abnormal posture  Muscle weakness (generalized)  Other muscle spasm  Chronic left shoulder pain     Problem List Patient Active Problem List   Diagnosis Date Noted  . Insulin-requiring or dependent type II diabetes mellitus ([place])   . Hyperlipidemia   . Neuropathy   . Genetic testing 08/27/2017  . Family history of colon cancer   . Family history of uterine cancer   . Family history of stomach cancer   . Family history of prostate cancer   . Port-A-Cath in place 06/20/2017  . Rectal cancer s/p robotic LAR rectosigmoid resection 01/17/2018 06/12/2017  . Other and unspecified hyperlipidemia 06/19/2013    Jule Ser, PT 05/11/2019, 10:20 AM  [facility] [address], [place], [place], [address] Phone: [phone]   Fax:  [phone]  Name: Garlin Haidet MRN: [id number] Date of Birth: [birth year]

## 2019-05-14 ENCOUNTER — Ambulatory Visit: Payer: 59 | Admitting: Physical Therapy

## 2019-05-14 ENCOUNTER — Other Ambulatory Visit: Payer: Self-pay

## 2019-05-14 ENCOUNTER — Encounter: Payer: Self-pay | Admitting: Physical Therapy

## 2019-05-14 DIAGNOSIS — M25512 Pain in left shoulder: Secondary | ICD-10-CM

## 2019-05-14 DIAGNOSIS — R293 Abnormal posture: Secondary | ICD-10-CM

## 2019-05-14 DIAGNOSIS — M6281 Muscle weakness (generalized): Secondary | ICD-10-CM

## 2019-05-14 DIAGNOSIS — M542 Cervicalgia: Secondary | ICD-10-CM | POA: Diagnosis not present

## 2019-05-14 DIAGNOSIS — G8929 Other chronic pain: Secondary | ICD-10-CM

## 2019-05-14 DIAGNOSIS — M62838 Other muscle spasm: Secondary | ICD-10-CM

## 2019-05-14 NOTE — Therapy (Addendum)
[facility] [address] [place], [place], [address] Phone: [phone]   Fax:  [phone]  Physical Therapy Treatment  Patient Details  Name: Charles [name] MRN: [id number] Date of Birth: Sep 11, 1965 Referring Provider (PT): Joanell Rising, [facility]   Encounter Date: 05/14/2019  PT End of Session - 05/14/19 0939    Visit Number  10    Date for PT Re-Evaluation  06/25/19    PT Start Time  0933    PT Stop Time  1023    PT Time Calculation (min)  50 min    Activity Tolerance  Patient tolerated treatment well    Behavior During Therapy  [facility] for tasks assessed/performed       Past Medical History:  Diagnosis Date  . Diabetes mellitus without complication ([place])    Type II  . Family history of colon cancer   . Family history of prostate cancer   . Family history of stomach cancer   . Family history of uterine cancer   . History of chemotherapy LAST CHEMO NOV 2019  . Hyperlipidemia   . Neuropathy    FEET AND HANDS PAIN IN LEGS AT TIMES    Past Surgical History:  Procedure Laterality Date  . ACHILLES TENDON REPAIR Right 2005  . APPENDECTOMY    . CYSTOSCOPY WITH STENT PLACEMENT Bilateral 01/17/2018   Procedure: CYSTOSCOPY WITH STENT PLACEMENT;  Surgeon: Kathie Rhodes, MD;  [facility];  Service: Urology;  Laterality: Bilateral;  . FLEXIBLE SIGMOIDOSCOPY N/A 01/17/2018   Procedure: FLEXIBLE SIGMOIDOSCOPY;  Surgeon: Ileana Roup, MD;  [facility];  Service: General;  Laterality: N/A;  . HERNIA REPAIR     as a baby  . ILEOSTOMY N/A 01/17/2018   Procedure: possible diverting LOOP ILEOSTOMY;  Surgeon: Ileana Roup, MD;  [facility];  Service: General;  Laterality: N/A;  . PORT-A-CATH REMOVAL Right 02/17/2018   Procedure: REMOVAL OF PORT-A-CATH;  Surgeon: Ileana Roup, MD;  [facility];  Service: General;  Laterality: Right;  . PORTACATH PLACEMENT N/A 06/17/2017   Procedure: RIGHT VS  LEFT INTERNAL JUGULAR PORT-A-CATH PLACEMENT WITH ULTRASOUND;  Surgeon: Ileana Roup, MD;  [facility];  Service: General;  Laterality: N/A;  FLURO    There were no vitals filed for this visit.  Subjective Assessment - 05/14/19 0937    Subjective  I had a massage on Tuesday and was very sore. Then yessterday felt much better.  Still a little numb but not as bad as it has been.    Patient Stated Goals  reduce pain    Currently in Pain?  No/denies                       [facility] Adult PT Treatment/Exercise - 05/14/19 0001      Neck Exercises: Theraband   Shoulder Extension  20 reps    Shoulder Extension Limitations  green    Rows  20 reps;Blue    Rows Limitations  low rows on power tower 15#      Neck Exercises: Standing   Wall Push Ups  15 reps   2 sets on pball   Wall Push Ups Limitations  cues to keep chin tucked and to not SB left    Other Standing Exercises  upper trap and SCM stretches 30 sec x 2      Manual Therapy   Soft tissue mobilization  cervical paraspinals and suboccipitals with manual traction  and passive upper trap stretch       cervical traction - mechanical - 10 sec rest; 60 sec pull max 15lb; min 5lb x 15 min        PT Short Term Goals - 05/14/19 0948      PT SHORT TERM GOAL #1   Title  Pt will report 25% less neck pain.    Baseline  40%    Status  Achieved      PT SHORT TERM GOAL #2   Title  ind with intial HEP    Status  Achieved        PT Long Term Goals - 04/03/19 0946      PT LONG TERM GOAL #1   Title  Pt will report at least 60% less pain when working on the computer and driving    Time  12    Period  Weeks    Status  New    Target Date  06/25/19      PT LONG TERM GOAL #2   Title  Pt will demonstrate 4+/5 MMT for scapular stabilizers    Baseline  4/5    Time  12    Period  Weeks    Status  New    Target Date  06/25/19      PT LONG TERM GOAL #3   Title  Pt will be I and compliant with HEP.    Time  12     Period  Weeks    Status  New    Target Date  06/25/19      PT LONG TERM GOAL #4   Title  Pt will demonstrate cervical rotation of at least 65 deg bilaterally without pain for improved driving    Time  12    Period  Weeks    Status  New    Target Date  06/25/19      PT LONG TERM GOAL #5   Title  FOTO < or = to 30% limited    Baseline  49%    Time  12    Period  Weeks    Status  New    Target Date  06/25/19            Plan - 05/14/19 1009    Clinical Impression Statement  Pt reports he is 40% improved meeting short term goals.  Pt has felt the best so far over the last day.  Pt was able to increase resistance of the scapular strengthening exercises and increase the pressure on the cervical traction due to good response from previous treatment.  Pt will benefit from skilled PT to continue according to plan of care and meet long term goals.    PT Treatment/Interventions  Taping;Dry needling;Passive range of motion;Manual techniques;Patient/family education;Therapeutic activities;Therapeutic exercise;Neuromuscular re-education;Electrical Stimulation;Cryotherapy;Biofeedback;ADLs/Self Care Home Management;Iontophoresis 4mg /ml Dexamethasone;Ultrasound    PT Next Visit Plan  f/u on traction; progress scapular strength, cervical position; srengthening posutre    PT Home Exercise Plan  Access Code: [email or web address]    Consulted and Agree with Plan of Care  Patient       Patient will benefit from skilled therapeutic intervention in order to improve the following deficits and impairments:  Postural dysfunction, Decreased strength, Pain, Increased muscle spasms, Decreased range of motion  Visit Diagnosis: Cervicalgia  Abnormal posture  Muscle weakness (generalized)  Other muscle spasm  Chronic left shoulder pain     Problem List Patient Active Problem List   Diagnosis Date Noted  .  Insulin-requiring or dependent type II diabetes mellitus ([place])   . Hyperlipidemia   . Neuropathy    . Genetic testing 08/27/2017  . Family history of colon cancer   . Family history of uterine cancer   . Family history of stomach cancer   . Family history of prostate cancer   . Port-A-Cath in place 06/20/2017  . Rectal cancer s/p robotic LAR rectosigmoid resection 01/17/2018 06/12/2017  . Other and unspecified hyperlipidemia 06/19/2013    Jule Ser, PT 05/14/2019, 10:24 AM  [facility] [address], [place], [place], [address] Phone: [phone]   Fax:  [phone]  Name: Charles [name] MRN: [id number] Date of Birth: Aug 05, 1965

## 2019-05-18 ENCOUNTER — Ambulatory Visit: Payer: 59 | Admitting: Physical Therapy

## 2019-05-18 ENCOUNTER — Encounter: Payer: Self-pay | Admitting: Physical Therapy

## 2019-05-18 ENCOUNTER — Other Ambulatory Visit: Payer: Self-pay

## 2019-05-18 DIAGNOSIS — M542 Cervicalgia: Secondary | ICD-10-CM

## 2019-05-18 DIAGNOSIS — R293 Abnormal posture: Secondary | ICD-10-CM

## 2019-05-18 DIAGNOSIS — M25512 Pain in left shoulder: Secondary | ICD-10-CM

## 2019-05-18 DIAGNOSIS — M6281 Muscle weakness (generalized): Secondary | ICD-10-CM

## 2019-05-18 DIAGNOSIS — G8929 Other chronic pain: Secondary | ICD-10-CM

## 2019-05-18 DIAGNOSIS — M62838 Other muscle spasm: Secondary | ICD-10-CM

## 2019-05-18 NOTE — Therapy (Signed)
[facility] [address], [place], [place], [address] Phone: [phone]   Fax:  [phone]  Physical Therapy Treatment  Patient Details  Name: Charles [name] MRN: [id number] Date of Birth: [birth year] Referring Provider (PT): Joanell Rising, [facility]   Encounter Date: 05/18/2019  PT End of Session - 05/18/19 1018    Visit Number  11    Date for PT Re-Evaluation  06/25/19    PT Start Time  1017    PT Stop Time  1057    PT Time Calculation (min)  40 min    Activity Tolerance  Patient tolerated treatment well    Behavior During Therapy  [facility] for tasks assessed/performed       Past Medical History:  Diagnosis Date  . Diabetes mellitus without complication ([place])    Type II  . Family history of colon cancer   . Family history of prostate cancer   . Family history of stomach cancer   . Family history of uterine cancer   . History of chemotherapy LAST CHEMO NOV 2019  . Hyperlipidemia   . Neuropathy    FEET AND HANDS PAIN IN LEGS AT TIMES    Past Surgical History:  Procedure Laterality Date  . ACHILLES TENDON REPAIR Right 2005  . APPENDECTOMY    . CYSTOSCOPY WITH STENT PLACEMENT Bilateral 01/17/2018   Procedure: CYSTOSCOPY WITH STENT PLACEMENT;  Surgeon: Kathie Rhodes, MD;  [facility]: [facility];  Service: Urology;  Laterality: Bilateral;  . FLEXIBLE SIGMOIDOSCOPY N/A 01/17/2018   Procedure: FLEXIBLE SIGMOIDOSCOPY;  Surgeon: Ileana Roup, MD;  [facility];  Service: General;  Laterality: N/A;  . HERNIA REPAIR     as a baby  . ILEOSTOMY N/A 01/17/2018   Procedure: possible diverting LOOP ILEOSTOMY;  Surgeon: Ileana Roup, MD;  [facility];  Service: General;  Laterality: N/A;  . PORT-A-CATH REMOVAL Right 02/17/2018   Procedure: REMOVAL OF PORT-A-CATH;  Surgeon: Ileana Roup, MD;  [facility];  Service: General;  Laterality: Right;  . PORTACATH PLACEMENT N/A 06/17/2017   Procedure: RIGHT VS  LEFT INTERNAL JUGULAR PORT-A-CATH PLACEMENT WITH ULTRASOUND;  Surgeon: Ileana Roup, MD;  [facility];  Service: General;  Laterality: N/A;  FLURO    There were no vitals filed for this visit.  Subjective Assessment - 05/18/19 1023    Subjective  Pt states he is feeling better.  I have a little numbness when driving over.    Patient Stated Goals  reduce pain    Currently in Pain?  No/denies         [facility] PT Assessment - 05/18/19 0001      AROM   Cervical - Right Rotation  65    Cervical - Left Rotation  75                   [facility] Adult PT Treatment/Exercise - 05/18/19 0001      Neck Exercises: Machines for Strengthening   UBE (Upper Arm Bike)  L1 2/2 fwd/back - PT present for status      Neck Exercises: Standing   Other Standing Exercises  shoulder flexion and scap - cues to keep chin tuck and scap depression - 2lb x 20      Neck Exercises: Supine   Neck Retraction  10 reps;5 secs    Cervical Rotation  Right;Left;5 reps    Shoulder ABduction  Both;20 reps    Upper Extremity D2  Flexion;20 reps;Theraband  Traction   Type of Traction  Cervical    Min (lbs)  5    Max (lbs)  17    Hold Time  60    Rest Time  10    Time  15 min               PT Short Term Goals - 05/14/19 0948      PT SHORT TERM GOAL #1   Title  Pt will report 25% less neck pain.    Baseline  40%    Status  Achieved      PT SHORT TERM GOAL #2   Title  ind with intial HEP    Status  Achieved        PT Long Term Goals - 05/18/19 1026      PT LONG TERM GOAL #4   Title  Pt will demonstrate cervical rotation of at least 65 deg bilaterally without pain for improved driving    Baseline  65 deg to the Rt; 75 deg to the Lt    Status  Achieved      Additional Long Term Goals   Additional Long Term Goals  Yes      PT LONG TERM GOAL #6   Title  Pt will be able to drive to PT or for about 20 minutes without getting increased numbness    Status  New    Target  Date  06/25/19            Plan - 05/18/19 1102    Clinical Impression Statement  Pt felt improvement after previous treatment and feels as though traction is helping.  Pt will benefit from skilled PT to continue to progress strength and posture with focus on upper thoracic ROM and scap stability as well as cervical retraction.  He will begin back at work next week.    PT Treatment/Interventions  Taping;Dry needling;Passive range of motion;Manual techniques;Patient/family education;Therapeutic activities;Therapeutic exercise;Neuromuscular re-education;Electrical Stimulation;Cryotherapy;Biofeedback;ADLs/Self Care Home Management;Iontophoresis 4mg /ml Dexamethasone;Ultrasound    PT Next Visit Plan  f/u on traction; progress scapular strength, cervical position; srengthening posutre    PT Home Exercise Plan  Access Code: [email or web address]    Consulted and Agree with Plan of Care  Patient       Patient will benefit from skilled therapeutic intervention in order to improve the following deficits and impairments:  Postural dysfunction, Decreased strength, Pain, Increased muscle spasms, Decreased range of motion  Visit Diagnosis: Cervicalgia  Abnormal posture  Muscle weakness (generalized)  Other muscle spasm  Chronic left shoulder pain     Problem List Patient Active Problem List   Diagnosis Date Noted  . Insulin-requiring or dependent type II diabetes mellitus ([place])   . Hyperlipidemia   . Neuropathy   . Genetic testing 08/27/2017  . Family history of colon cancer   . Family history of uterine cancer   . Family history of stomach cancer   . Family history of prostate cancer   . Port-A-Cath in place 06/20/2017  . Rectal cancer s/p robotic LAR rectosigmoid resection 01/17/2018 06/12/2017  . Other and unspecified hyperlipidemia 06/19/2013    Jule Ser, PT 05/18/2019, 11:05 AM  [facility] [address], [place], [place], [address] Phone: [phone]   Fax:  [phone]  Name: Charles [name] MRN: [id number] Date of Birth: Feb 14, 1965

## 2019-05-21 ENCOUNTER — Other Ambulatory Visit: Payer: Self-pay

## 2019-05-21 ENCOUNTER — Encounter: Payer: Self-pay | Admitting: Physical Therapy

## 2019-05-21 ENCOUNTER — Ambulatory Visit: Payer: 59 | Admitting: Physical Therapy

## 2019-05-21 DIAGNOSIS — M542 Cervicalgia: Secondary | ICD-10-CM | POA: Diagnosis not present

## 2019-05-21 DIAGNOSIS — M6281 Muscle weakness (generalized): Secondary | ICD-10-CM

## 2019-05-21 DIAGNOSIS — M62838 Other muscle spasm: Secondary | ICD-10-CM

## 2019-05-21 DIAGNOSIS — R293 Abnormal posture: Secondary | ICD-10-CM

## 2019-05-21 DIAGNOSIS — G8929 Other chronic pain: Secondary | ICD-10-CM

## 2019-05-21 NOTE — Therapy (Signed)
[facility] [address], [place], [place], [address] Phone: [phone]   Fax:  [phone]  Physical Therapy Treatment  Patient Details  Name: Charles [name] MRN: [id number] Date of Birth: 05-Mar-1965 Referring Provider (PT): Joanell Rising, [facility]   Encounter Date: 05/21/2019  PT End of Session - 05/21/19 0941    Visit Number  12    Date for PT Re-Evaluation  06/25/19    PT Start Time  0930    PT Stop Time  1019    PT Time Calculation (min)  49 min    Activity Tolerance  Patient tolerated treatment well    Behavior During Therapy  [facility] for tasks assessed/performed       Past Medical History:  Diagnosis Date  . Diabetes mellitus without complication ([place])    Type II  . Family history of colon cancer   . Family history of prostate cancer   . Family history of stomach cancer   . Family history of uterine cancer   . History of chemotherapy LAST CHEMO NOV 2019  . Hyperlipidemia   . Neuropathy    FEET AND HANDS PAIN IN LEGS AT TIMES    Past Surgical History:  Procedure Laterality Date  . ACHILLES TENDON REPAIR Right 2005  . APPENDECTOMY    . CYSTOSCOPY WITH STENT PLACEMENT Bilateral 01/17/2018   Procedure: CYSTOSCOPY WITH STENT PLACEMENT;  Surgeon: Kathie Rhodes, MD;  [facility];  Service: Urology;  Laterality: Bilateral;  . FLEXIBLE SIGMOIDOSCOPY N/A 01/17/2018   Procedure: FLEXIBLE SIGMOIDOSCOPY;  Surgeon: Ileana Roup, MD;  [facility];  Service: General;  Laterality: N/A;  . HERNIA REPAIR     as a baby  . ILEOSTOMY N/A 01/17/2018   Procedure: possible diverting LOOP ILEOSTOMY;  Surgeon: Ileana Roup, MD;  [facility];  Service: General;  Laterality: N/A;  . PORT-A-CATH REMOVAL Right 02/17/2018   Procedure: REMOVAL OF PORT-A-CATH;  Surgeon: Ileana Roup, MD;  [facility];  Service: General;  Laterality: Right;  . PORTACATH PLACEMENT N/A 06/17/2017   Procedure: RIGHT VS  LEFT INTERNAL JUGULAR PORT-A-CATH PLACEMENT WITH ULTRASOUND;  Surgeon: Ileana Roup, MD;  [facility];  Service: General;  Laterality: N/A;  FLURO    There were no vitals filed for this visit.  Subjective Assessment - 05/21/19 0934    Subjective  Pt states he is sore in the back and neck after lifting scooter onto the car yesterday.  Pt reports he was a little sore the day after his last treatment session.    Currently in Pain?  Yes    Pain Score  5     Pain Location  Shoulder    Pain Orientation  Left    Pain Descriptors / Indicators  Sore;Numbness    Pain Type  Chronic pain    Pain Onset  More than a month ago    Multiple Pain Sites  No                        [facility] Adult PT Treatment/Exercise - 05/21/19 0001      Neck Exercises: Machines for Strengthening   UBE (Upper Arm Bike)  L1.3 2/2 fwd/back - PT present for status      Neck Exercises: Standing   Other Standing Exercises  modified plank - cervical retractions, head turn with chin tuck, UE reaches - 10x each      Neck Exercises: Supine  Other Supine Exercise  green band - diagonals and Ws - 20x each      Traction   Type of Traction  Cervical    Min (lbs)  5    Max (lbs)  16    Hold Time  60    Rest Time  10    Time  15 min      Manual Therapy   Soft tissue mobilization  cervical paraspinals and suboccipitals with manual traction and passive upper trap stretch               PT Short Term Goals - 05/14/19 0948      PT SHORT TERM GOAL #1   Title  Pt will report 25% less neck pain.    Baseline  40%    Status  Achieved      PT SHORT TERM GOAL #2   Title  ind with intial HEP    Status  Achieved        PT Long Term Goals - 05/18/19 1026      PT LONG TERM GOAL #4   Title  Pt will demonstrate cervical rotation of at least 65 deg bilaterally without pain for improved driving    Baseline  65 deg to the Rt; 75 deg to the Lt    Status  Achieved      Additional Long Term  Goals   Additional Long Term Goals  Yes      PT LONG TERM GOAL #6   Title  Pt will be able to drive to PT or for about 20 minutes without getting increased numbness    Status  New    Target Date  06/25/19            Plan - 05/21/19 1004    Clinical Impression Statement  Pt was experiencing more pain today after lifting scooter into the car.  Treatment continues to focus on posture.  Pt needed moderate cues in modified plank position on elevated table.  Pt needed cues including chin tuck to length cervical spine and cue to depression scapula.  Pt did well in this position but fatigues and reported some increased tension in the cervical region.  Mechanical traction performed today with decreased pressure due to increased pain after previous treatment.  He will benefit from skilled PT to continue with posture and strength.    PT Treatment/Interventions  Taping;Dry needling;Passive range of motion;Manual techniques;Patient/family education;Therapeutic activities;Therapeutic exercise;Neuromuscular re-education;Electrical Stimulation;Cryotherapy;Biofeedback;ADLs/Self Care Home Management;Iontophoresis 4mg /ml Dexamethasone;Ultrasound    PT Next Visit Plan  f/u on traction; progress scapular strength, cervical position; srengthening posutre, modified plank/push up progression    PT Home Exercise Plan  Access Code: [email or web address]    Consulted and Agree with Plan of Care  Patient       Patient will benefit from skilled therapeutic intervention in order to improve the following deficits and impairments:  Postural dysfunction, Decreased strength, Pain, Increased muscle spasms, Decreased range of motion  Visit Diagnosis: Cervicalgia  Abnormal posture  Muscle weakness (generalized)  Other muscle spasm  Chronic left shoulder pain     Problem List Patient Active Problem List   Diagnosis Date Noted  . Insulin-requiring or dependent type II diabetes mellitus ([place])   . Hyperlipidemia   .  Neuropathy   . Genetic testing 08/27/2017  . Family history of colon cancer   . Family history of uterine cancer   . Family history of stomach cancer   . Family history of prostate cancer   .  Port-A-Cath in place 06/20/2017  . Rectal cancer s/p robotic LAR rectosigmoid resection 01/17/2018 06/12/2017  . Other and unspecified hyperlipidemia 06/19/2013    Jule Ser, PT 05/21/2019, 10:15 AM  [facility] [address], [place], [place], [address] Phone: [phone]   Fax:  [phone]  Name: Charles [name] MRN: [id number] Date of Birth: Apr 18, 1965

## 2019-05-25 ENCOUNTER — Other Ambulatory Visit: Payer: Self-pay

## 2019-05-25 ENCOUNTER — Encounter: Payer: Self-pay | Admitting: Physical Therapy

## 2019-05-25 ENCOUNTER — Ambulatory Visit: Payer: 59 | Admitting: Physical Therapy

## 2019-05-25 DIAGNOSIS — M542 Cervicalgia: Secondary | ICD-10-CM

## 2019-05-25 DIAGNOSIS — M6281 Muscle weakness (generalized): Secondary | ICD-10-CM

## 2019-05-25 DIAGNOSIS — M25512 Pain in left shoulder: Secondary | ICD-10-CM

## 2019-05-25 DIAGNOSIS — M62838 Other muscle spasm: Secondary | ICD-10-CM

## 2019-05-25 DIAGNOSIS — G8929 Other chronic pain: Secondary | ICD-10-CM

## 2019-05-25 DIAGNOSIS — R293 Abnormal posture: Secondary | ICD-10-CM

## 2019-05-25 NOTE — Therapy (Signed)
[facility] [address], [place], [place], [address] Phone: [phone]   Fax:  [phone]  Physical Therapy Treatment  Patient Details  Name: Charles [name] MRN: [id number] Date of Birth: [birth year] Referring Provider (PT): Joanell Rising, [facility]   Encounter Date: 05/25/2019  PT End of Session - 05/25/19 1303    Visit Number  13    Date for PT Re-Evaluation  06/25/19    Authorization Type  [employer]    PT Start Time  1230    PT Stop Time  [id number]    PT Time Calculation (min)  45 min    Activity Tolerance  Patient tolerated treatment well;No increased pain    Behavior During Therapy  [facility] for tasks assessed/performed       Past Medical History:  Diagnosis Date  . Diabetes mellitus without complication ([place])    Type II  . Family history of colon cancer   . Family history of prostate cancer   . Family history of stomach cancer   . Family history of uterine cancer   . History of chemotherapy LAST CHEMO NOV 2019  . Hyperlipidemia   . Neuropathy    FEET AND HANDS PAIN IN LEGS AT TIMES    Past Surgical History:  Procedure Laterality Date  . ACHILLES TENDON REPAIR Right 2005  . APPENDECTOMY    . CYSTOSCOPY WITH STENT PLACEMENT Bilateral 01/17/2018   Procedure: CYSTOSCOPY WITH STENT PLACEMENT;  Surgeon: Kathie Rhodes, MD;  [facility];  Service: Urology;  Laterality: Bilateral;  . FLEXIBLE SIGMOIDOSCOPY N/A 01/17/2018   Procedure: FLEXIBLE SIGMOIDOSCOPY;  Surgeon: Ileana Roup, MD;  [facility];  Service: General;  Laterality: N/A;  . HERNIA REPAIR     as a baby  . ILEOSTOMY N/A 01/17/2018   Procedure: possible diverting LOOP ILEOSTOMY;  Surgeon: Ileana Roup, MD;  [facility];  Service: General;  Laterality: N/A;  . PORT-A-CATH REMOVAL Right 02/17/2018   Procedure: REMOVAL OF PORT-A-CATH;  Surgeon: Ileana Roup, MD;  [facility];  Service: General;  Laterality: Right;  . PORTACATH  PLACEMENT N/A 06/17/2017   Procedure: RIGHT VS LEFT INTERNAL JUGULAR PORT-A-CATH PLACEMENT WITH ULTRASOUND;  Surgeon: Ileana Roup, MD;  [facility];  Service: General;  Laterality: N/A;  FLURO    There were no vitals filed for this visit.  Subjective Assessment - 05/25/19 1235    Subjective  Had a massage yesterday that helped. I still get numbness in the left arm and it is not as bad. Today is my first day back to work. I have a standing desk at work.    How long can you sit comfortably?  4 hours and then I need to take medicine, but always hurts    Patient Stated Goals  reduce pain    Currently in Pain?  Yes    Pain Score  4     Pain Location  Shoulder    Pain Orientation  Left    Pain Descriptors / Indicators  Sore;Numbness    Pain Type  Chronic pain    Pain Onset  More than a month ago    Pain Frequency  Intermittent    Aggravating Factors   reaching forward (typing at computer-arms flexed to 90 degrees)    Pain Relieving Factors  OTC medicine    Multiple Pain Sites  No  [place] Adult PT Treatment/Exercise - 05/25/19 0001      Neck Exercises: Supine   Other Supine Exercise  green band - diagonals and Ws - 20x each   VC to squeeze shoulder blades and chin retraction   Other Supine Exercise  supine y motion above head with green band 15 x      Traction   Type of Traction  Cervical    Min (lbs)  5    Max (lbs)  18    Hold Time  60    Rest Time  10    Time  15 min      Manual Therapy   Manual Therapy  Joint mobilization;Neural Stretch    Joint Mobilization  side glide to C3-C7; manual cervical retraction then cervical retraction with extension    Neural Stretch  ot left arm for ulnar and brachial plexus nerve               PT Short Term Goals - 05/14/19 0948      PT SHORT TERM GOAL #1   Title  Pt will report 25% less neck pain.    Baseline  40%    Status  Achieved      PT SHORT TERM GOAL #2   Title  ind  with intial HEP    Status  Achieved        PT Long Term Goals - 05/25/19 1307      PT LONG TERM GOAL #1   Title  Pt will report at least 60% less pain when working on the computer and driving    Baseline  pain level 4/10    Time  12    Period  Weeks    Status  On-going      PT LONG TERM GOAL #2   Title  Pt will demonstrate 4+/5 MMT for scapular stabilizers    Baseline  4/5    Time  12    Period  Weeks    Status  On-going      PT LONG TERM GOAL #3   Title  Pt will be I and compliant with HEP.    Baseline  continues to learn new exercises as he progresses    Time  12    Period  Weeks    Status  On-going      PT LONG TERM GOAL #4   Title  Pt will demonstrate cervical rotation of at least 65 deg bilaterally without pain for improved driving    Baseline  65 deg to the Rt; 75 deg to the Lt    Time  12    Period  Weeks    Status  Achieved      PT LONG TERM GOAL #6   Title  Pt will be able to drive to PT or for about 20 minutes without getting increased numbness    Status  On-going            Plan - 05/25/19 1303    Clinical Impression Statement  Patient reports he has less numbness and pain while driving in the left arm. Patient has tightness in the cervical vertebrae. Patient did well with the neural tension stretch. Patient continues to need verbal cues to squeeze his shoulder blades and retract his head with exercise. Patient feels like he has increased movement in the cervical after manual work. Patient will benefit from skilled PT to continue with posture and strength.    Personal Factors and Comorbidities  Time since onset of injury/illness/exacerbation    Examination-Participation Restrictions  Driving    Stability/Clinical Decision Making  Evolving/Moderate complexity    Rehab Potential  Excellent    PT Frequency  2x / week    PT Duration  12 weeks    PT Treatment/Interventions  Taping;Dry needling;Passive range of motion;Manual techniques;Patient/family  education;Therapeutic activities;Therapeutic exercise;Neuromuscular re-education;Electrical Stimulation;Cryotherapy;Biofeedback;ADLs/Self Care Home Management;Iontophoresis 4mg /ml Dexamethasone;Ultrasound    PT Next Visit Plan  continue with traction; neural tension strength for HEP; modified plank/push up progression    PT Home Exercise Plan  Access Code: [email or web address]    Recommended Other Services  MD signed the initial eval    Consulted and Agree with Plan of Care  Patient       Patient will benefit from skilled therapeutic intervention in order to improve the following deficits and impairments:  Postural dysfunction, Decreased strength, Pain, Increased muscle spasms, Decreased range of motion  Visit Diagnosis: Cervicalgia  Abnormal posture  Muscle weakness (generalized)  Other muscle spasm  Chronic left shoulder pain     Problem List Patient Active Problem List   Diagnosis Date Noted  . Insulin-requiring or dependent type II diabetes mellitus ([place])   . Hyperlipidemia   . Neuropathy   . Genetic testing 08/27/2017  . Family history of colon cancer   . Family history of uterine cancer   . Family history of stomach cancer   . Family history of prostate cancer   . Port-A-Cath in place 06/20/2017  . Rectal cancer s/p robotic LAR rectosigmoid resection 01/17/2018 06/12/2017  . Other and unspecified hyperlipidemia 06/19/2013    Earlie Counts, PT 05/25/19 1:16 PM   [facility] [address] [place], [place], [address] Phone: [phone]   Fax:  [phone]  Name: Charles [name] MRN: [id number] Date of Birth: 12-14-1965

## 2019-06-12 ENCOUNTER — Other Ambulatory Visit: Payer: Self-pay

## 2019-06-12 ENCOUNTER — Ambulatory Visit: Payer: 59 | Attending: Orthopedic Surgery | Admitting: Physical Therapy

## 2019-06-12 DIAGNOSIS — M62838 Other muscle spasm: Secondary | ICD-10-CM

## 2019-06-12 DIAGNOSIS — M542 Cervicalgia: Secondary | ICD-10-CM

## 2019-06-12 DIAGNOSIS — M6281 Muscle weakness (generalized): Secondary | ICD-10-CM | POA: Insufficient documentation

## 2019-06-12 DIAGNOSIS — G8929 Other chronic pain: Secondary | ICD-10-CM | POA: Diagnosis present

## 2019-06-12 DIAGNOSIS — R293 Abnormal posture: Secondary | ICD-10-CM | POA: Diagnosis present

## 2019-06-12 DIAGNOSIS — M25512 Pain in left shoulder: Secondary | ICD-10-CM | POA: Diagnosis present

## 2019-06-12 NOTE — Therapy (Signed)
[facility] [address] [place], [place], [address] Phone: [phone]   Fax:  [phone]  Physical Therapy Treatment  Patient Details  Name: Charles [name] MRN: [id number] Date of Birth: Dec 02, 1965 Referring Provider (PT): Joanell Rising, [facility]   Encounter Date: 06/12/2019  PT End of Session - 06/12/19 1030    Visit Number  14    Date for PT Re-Evaluation  06/25/19    Authorization Type  UHC    PT Start Time  0801    PT Stop Time  5170    PT Time Calculation (min)  56 min    Activity Tolerance  Patient tolerated treatment well;No increased pain    Behavior During Therapy  WFL for tasks assessed/performed       Past Medical History:  Diagnosis Date  . Diabetes mellitus without complication ([place])    Type II  . Family history of colon cancer   . Family history of prostate cancer   . Family history of stomach cancer   . Family history of uterine cancer   . History of chemotherapy LAST CHEMO NOV 2019  . Hyperlipidemia   . Neuropathy    FEET AND HANDS PAIN IN LEGS AT TIMES    Past Surgical History:  Procedure Laterality Date  . ACHILLES TENDON REPAIR Right 2005  . APPENDECTOMY    . CYSTOSCOPY WITH STENT PLACEMENT Bilateral 01/17/2018   Procedure: CYSTOSCOPY WITH STENT PLACEMENT;  Surgeon: Kathie Rhodes, MD;  Location: [facility];  Service: Urology;  Laterality: Bilateral;  . FLEXIBLE SIGMOIDOSCOPY N/A 01/17/2018   Procedure: FLEXIBLE SIGMOIDOSCOPY;  Surgeon: Ileana Roup, MD;  [facility];  Service: General;  Laterality: N/A;  . HERNIA REPAIR     as a baby  . ILEOSTOMY N/A 01/17/2018   Procedure: possible diverting LOOP ILEOSTOMY;  Surgeon: Ileana Roup, MD;  [facility];  Service: General;  Laterality: N/A;  . PORT-A-CATH REMOVAL Right 02/17/2018   Procedure: REMOVAL OF PORT-A-CATH;  Surgeon: Ileana Roup, MD;  [facility];  Service: General;  Laterality: Right;  . PORTACATH  PLACEMENT N/A 06/17/2017   Procedure: RIGHT VS LEFT INTERNAL JUGULAR PORT-A-CATH PLACEMENT WITH ULTRASOUND;  Surgeon: Ileana Roup, MD;  [facility];  Service: General;  Laterality: N/A;  FLURO    There were no vitals filed for this visit.  Subjective Assessment - 06/12/19 0808    Subjective  Charles [name] helped a lot.  My neck and back were feeling better.  The exercises now causing more tightness, but overall there is improvement.    Patient Stated Goals  reduce pain    Currently in Pain?  Yes    Pain Score  4     Pain Location  Neck    Pain Orientation  Left    Pain Type  Chronic pain    Pain Onset  More than a month ago    Pain Frequency  Intermittent    Multiple Pain Sites  No                        [facility] Adult PT Treatment/Exercise - 06/12/19 0001      Neck Exercises: Machines for Strengthening   UBE (Upper Arm Bike)  L1.3 2/2 fwd/back - PT present for status      Neck Exercises: Seated   Other Seated Exercise  educated and performed ulnar nerve glides 3 ways      Traction  Type of Traction  Cervical    Min (lbs)  5    Max (lbs)  18    Hold Time  60    Rest Time  10    Time  15 min      Manual Therapy   Joint Mobilization  side glide to C3-C7; manual traction               PT Short Term Goals - 05/14/19 0948      PT SHORT TERM GOAL #1   Title  Pt will report 25% less neck pain.    Baseline  40%    Status  Achieved      PT SHORT TERM GOAL #2   Title  ind with intial HEP    Status  Achieved        PT Long Term Goals - 05/25/19 1307      PT LONG TERM GOAL #1   Title  Pt will report at least 60% less pain when working on the computer and driving    Baseline  pain level 4/10    Time  12    Period  Weeks    Status  On-going      PT LONG TERM GOAL #2   Title  Pt will demonstrate 4+/5 MMT for scapular stabilizers    Baseline  4/5    Time  12    Period  Weeks    Status  On-going      PT LONG TERM GOAL #3   Title  Pt  will be I and compliant with HEP.    Baseline  continues to learn new exercises as he progresses    Time  12    Period  Weeks    Status  On-going      PT LONG TERM GOAL #4   Title  Pt will demonstrate cervical rotation of at least 65 deg bilaterally without pain for improved driving    Baseline  65 deg to the Rt; 75 deg to the Lt    Time  12    Period  Weeks    Status  Achieved      PT LONG TERM GOAL #6   Title  Pt will be able to drive to PT or for about 20 minutes without getting increased numbness    Status  On-going            Plan - 06/12/19 1029    Clinical Impression Statement  Pt feels overall improved and has been back to work.  Pt had more mobility after manual work today.  Pt was educated on and performed nerve glides and responded well to this exercise.  Pt will benefit from skilled PT to continue to progress posture and soft tissue mobilty.    PT Treatment/Interventions  Taping;Dry needling;Passive range of motion;Manual techniques;Patient/family education;Therapeutic activities;Therapeutic exercise;Neuromuscular re-education;Electrical Stimulation;Cryotherapy;Biofeedback;ADLs/Self Care Home Management;Iontophoresis 4mg /ml Dexamethasone;Ultrasound    PT Next Visit Plan  continue with traction; f/u neural tension stretch for HEP; modified plank/push up progression    PT Home Exercise Plan  Access Code: [email or web address]    Consulted and Agree with Plan of Care  Patient       Patient will benefit from skilled therapeutic intervention in order to improve the following deficits and impairments:  Postural dysfunction, Decreased strength, Pain, Increased muscle spasms, Decreased range of motion  Visit Diagnosis: Cervicalgia  Abnormal posture  Muscle weakness (generalized)  Other muscle spasm  Chronic left shoulder pain  Problem List Patient Active Problem List   Diagnosis Date Noted  . Insulin-requiring or dependent type II diabetes mellitus ([place])   .  Hyperlipidemia   . Neuropathy   . Genetic testing 08/27/2017  . Family history of colon cancer   . Family history of uterine cancer   . Family history of stomach cancer   . Family history of prostate cancer   . Port-A-Cath in place 06/20/2017  . Rectal cancer s/p robotic LAR rectosigmoid resection 01/17/2018 06/12/2017  . Other and unspecified hyperlipidemia 06/19/2013    Jule Ser, PT 06/12/2019, 10:50 AM  [facility] [address] [place], [place], [address] Phone: [phone]   Fax:  [phone]  Name: Charles [name] MRN: [id number] Date of Birth: September 26, 1965

## 2019-06-19 ENCOUNTER — Encounter: Payer: Self-pay | Admitting: Physical Therapy

## 2019-06-19 ENCOUNTER — Other Ambulatory Visit: Payer: Self-pay

## 2019-06-19 ENCOUNTER — Ambulatory Visit: Payer: 59 | Admitting: Physical Therapy

## 2019-06-19 DIAGNOSIS — G8929 Other chronic pain: Secondary | ICD-10-CM

## 2019-06-19 DIAGNOSIS — M62838 Other muscle spasm: Secondary | ICD-10-CM

## 2019-06-19 DIAGNOSIS — M542 Cervicalgia: Secondary | ICD-10-CM

## 2019-06-19 DIAGNOSIS — R293 Abnormal posture: Secondary | ICD-10-CM

## 2019-06-19 DIAGNOSIS — M6281 Muscle weakness (generalized): Secondary | ICD-10-CM

## 2019-06-19 DIAGNOSIS — M25512 Pain in left shoulder: Secondary | ICD-10-CM

## 2019-06-19 NOTE — Therapy (Signed)
[facility] [address], [place], [place], [address] Phone: [phone]   Fax:  [phone]  Physical Therapy Treatment  Patient Details  Name: Charles [name] MRN: [id number] Date of Birth: Dec 04, 1965 Referring Provider (PT): Joanell Rising, [facility]   Encounter Date: 06/19/2019   PT End of Session - 06/19/19 0806    Visit Number 15    Date for PT Re-Evaluation 06/25/19    Authorization Type UHC    PT Start Time 0801    PT Stop Time 0850    PT Time Calculation (min) 49 min    Activity Tolerance Patient tolerated treatment well;No increased pain    Behavior During Therapy WFL for tasks assessed/performed           Past Medical History:  Diagnosis Date  . Diabetes mellitus without complication ([place])    Type II  . Family history of colon cancer   . Family history of prostate cancer   . Family history of stomach cancer   . Family history of uterine cancer   . History of chemotherapy LAST CHEMO NOV 2019  . Hyperlipidemia   . Neuropathy    FEET AND HANDS PAIN IN LEGS AT TIMES    Past Surgical History:  Procedure Laterality Date  . ACHILLES TENDON REPAIR Right 2005  . APPENDECTOMY    . CYSTOSCOPY WITH STENT PLACEMENT Bilateral 01/17/2018   Procedure: CYSTOSCOPY WITH STENT PLACEMENT;  Surgeon: Kathie Rhodes, MD;  [facility];  Service: Urology;  Laterality: Bilateral;  . FLEXIBLE SIGMOIDOSCOPY N/A 01/17/2018   Procedure: FLEXIBLE SIGMOIDOSCOPY;  Surgeon: Ileana Roup, MD;  [facility];  Service: General;  Laterality: N/A;  . HERNIA REPAIR     as a baby  . ILEOSTOMY N/A 01/17/2018   Procedure: possible diverting LOOP ILEOSTOMY;  Surgeon: Ileana Roup, MD;  [facility];  Service: General;  Laterality: N/A;  . PORT-A-CATH REMOVAL Right 02/17/2018   Procedure: REMOVAL OF PORT-A-CATH;  Surgeon: Ileana Roup, MD;  [facility];  Service: General;  Laterality: Right;  . PORTACATH  PLACEMENT N/A 06/17/2017   Procedure: RIGHT VS LEFT INTERNAL JUGULAR PORT-A-CATH PLACEMENT WITH ULTRASOUND;  Surgeon: Ileana Roup, MD;  [facility];  Service: General;  Laterality: N/A;  FLURO    There were no vitals filed for this visit.   Subjective Assessment - 06/19/19 0804    Subjective Pt states it is more sore this morning because I was working a lot yesterday    Patient Stated Goals reduce pain    Currently in Pain? Yes    Pain Score 4     Pain Location Neck    Pain Orientation Left    Pain Descriptors / Indicators Sore    Pain Type Chronic pain    Multiple Pain Sites No                             [facility] Adult PT Treatment/Exercise - 06/19/19 0001      Neck Exercises: Standing   Neck Retraction Limitations retraction with power tower ex: row and extension 20# - 30x each; retraction standing lean on ball - Ws 20x    Other Standing Exercises ulnar nerve glide 10x      Neck Exercises: Supine   Other Supine Exercise green band - diagonals 20x each   VC to squeeze shoulder blades and chin retraction     Traction  Type of Traction Cervical    Min (lbs) 5    Max (lbs) 18    Hold Time 60    Rest Time 10    Time 15 min      Manual Therapy   Joint Mobilization side glide to C3-C7; manual traction                    PT Short Term Goals - 05/14/19 0948      PT SHORT TERM GOAL #1   Title Pt will report 25% less neck pain.    Baseline 40%    Status Achieved      PT SHORT TERM GOAL #2   Title ind with intial HEP    Status Achieved             PT Long Term Goals - 05/25/19 1307      PT LONG TERM GOAL #1   Title Pt will report at least 60% less pain when working on the computer and driving    Baseline pain level 4/10    Time 12    Period Weeks    Status On-going      PT LONG TERM GOAL #2   Title Pt will demonstrate 4+/5 MMT for scapular stabilizers    Baseline 4/5    Time 12    Period Weeks    Status On-going        PT LONG TERM GOAL #3   Title Pt will be I and compliant with HEP.    Baseline continues to learn new exercises as he progresses    Time 12    Period Weeks    Status On-going      PT LONG TERM GOAL #4   Title Pt will demonstrate cervical rotation of at least 65 deg bilaterally without pain for improved driving    Baseline 65 deg to the Rt; 75 deg to the Lt    Time 12    Period Weeks    Status Achieved      PT LONG TERM GOAL #6   Title Pt will be able to drive to PT or for about 20 minutes without getting increased numbness    Status On-going                 Plan - 06/19/19 0841    Clinical Impression Statement Pt did well with exercises today and felt they all were feeling good. He came in with more pain due to a lot of sitting.  Pt continues to have some stiffness that loosens with cervical joint mobs.  Pt will benefi tfrom skilled PT to work on successful transition to home and gym workouts for continues postural strength    PT Treatment/Interventions Taping;Dry needling;Passive range of motion;Manual techniques;Patient/family education;Therapeutic activities;Therapeutic exercise;Neuromuscular re-education;Electrical Stimulation;Cryotherapy;Biofeedback;ADLs/Self Care Home Management;Iontophoresis 4mg /ml Dexamethasone;Ultrasound    PT Next Visit Plan re-assess; STM and traction as needed; HEP review and progress if able    PT Home Exercise Plan Access Code: [email or web address]    Consulted and Agree with Plan of Care Patient           Patient will benefit from skilled therapeutic intervention in order to improve the following deficits and impairments:  Postural dysfunction, Decreased strength, Pain, Increased muscle spasms, Decreased range of motion  Visit Diagnosis: Cervicalgia  Abnormal posture  Muscle weakness (generalized)  Other muscle spasm  Chronic left shoulder pain     Problem List Patient Active Problem List   Diagnosis  Date Noted  . Insulin-requiring or  dependent type II diabetes mellitus ([place])   . Hyperlipidemia   . Neuropathy   . Genetic testing 08/27/2017  . Family history of colon cancer   . Family history of uterine cancer   . Family history of stomach cancer   . Family history of prostate cancer   . Port-A-Cath in place 06/20/2017  . Rectal cancer s/p robotic LAR rectosigmoid resection 01/17/2018 06/12/2017  . Other and unspecified hyperlipidemia 06/19/2013    Jule Ser, PT 06/19/2019, 8:48 AM  [facility] [address] [place], [place], [address] Phone: [phone]   Fax:  [phone]  Name: Charles [name] MRN: [id number] Date of Birth: Feb 01, 1965

## 2019-06-25 ENCOUNTER — Encounter: Payer: Self-pay | Admitting: Physical Therapy

## 2019-06-25 ENCOUNTER — Other Ambulatory Visit: Payer: Self-pay

## 2019-06-25 ENCOUNTER — Ambulatory Visit: Payer: 59 | Admitting: Physical Therapy

## 2019-06-25 DIAGNOSIS — M6281 Muscle weakness (generalized): Secondary | ICD-10-CM

## 2019-06-25 DIAGNOSIS — R293 Abnormal posture: Secondary | ICD-10-CM

## 2019-06-25 DIAGNOSIS — M542 Cervicalgia: Secondary | ICD-10-CM

## 2019-06-25 DIAGNOSIS — G8929 Other chronic pain: Secondary | ICD-10-CM

## 2019-06-25 DIAGNOSIS — M62838 Other muscle spasm: Secondary | ICD-10-CM

## 2019-06-25 NOTE — Therapy (Addendum)
[facility] [address] [place], [place], [address] Phone: [phone]   Fax:  [phone]  Physical Therapy Treatment  Patient Details  Name: Charles [name] MRN: [id number] Date of Birth: 1965-08-06 Referring Provider (PT): Joanell Rising, [facility]   Encounter Date: 06/25/2019   PT End of Session - 06/25/19 0813    Visit Number 16    Date for PT Re-Evaluation 06/25/19    PT Start Time 0800    PT Stop Time 0850    PT Time Calculation (min) 50 min    Activity Tolerance Patient tolerated treatment well;No increased pain    Behavior During Therapy WFL for tasks assessed/performed           Past Medical History:  Diagnosis Date  . Diabetes mellitus without complication ([place])    Type II  . Family history of colon cancer   . Family history of prostate cancer   . Family history of stomach cancer   . Family history of uterine cancer   . History of chemotherapy LAST CHEMO NOV 2019  . Hyperlipidemia   . Neuropathy    FEET AND HANDS PAIN IN LEGS AT TIMES    Past Surgical History:  Procedure Laterality Date  . ACHILLES TENDON REPAIR Right 2005  . APPENDECTOMY    . CYSTOSCOPY WITH STENT PLACEMENT Bilateral 01/17/2018   Procedure: CYSTOSCOPY WITH STENT PLACEMENT;  Surgeon: Kathie Rhodes, MD;  [facility];  Service: Urology;  Laterality: Bilateral;  . FLEXIBLE SIGMOIDOSCOPY N/A 01/17/2018   Procedure: FLEXIBLE SIGMOIDOSCOPY;  Surgeon: Ileana Roup, MD;  [facility];  Service: General;  Laterality: N/A;  . HERNIA REPAIR     as a baby  . ILEOSTOMY N/A 01/17/2018   Procedure: possible diverting LOOP ILEOSTOMY;  Surgeon: Ileana Roup, MD;  [facility];  Service: General;  Laterality: N/A;  . PORT-A-CATH REMOVAL Right 02/17/2018   Procedure: REMOVAL OF PORT-A-CATH;  Surgeon: Ileana Roup, MD;  [facility];  Service: General;  Laterality: Right;  . PORTACATH PLACEMENT N/A 06/17/2017    Procedure: RIGHT VS LEFT INTERNAL JUGULAR PORT-A-CATH PLACEMENT WITH ULTRASOUND;  Surgeon: Ileana Roup, MD;  [facility];  Service: General;  Laterality: N/A;  FLURO    There were no vitals filed for this visit.   Subjective Assessment - 06/25/19 0802    Subjective I feel like I know what to do when it hurts now. I feel 75% better than when I started    Currently in Pain? Yes    Pain Score 3     Pain Location Neck    Pain Orientation Left    Pain Descriptors / Indicators Discomfort    Pain Type Chronic pain    Pain Onset More than a month ago    Pain Frequency Intermittent    Multiple Pain Sites No                             [facility] Adult PT Treatment/Exercise - 06/25/19 0001      Lumbar Exercises: Aerobic   Stationary Bike L2 x 3 min PT present for status      Lumbar Exercises: Standing   Row Strengthening;Power tower;Both   3x10 25#   Shoulder Extension Strengthening;Both   3x10 25#   Other Standing Lumbar Exercises lat pull down 25# power tower - 3x 10      Traction   Type of Traction  Cervical    Min (lbs) 5    Max (lbs) 18    Hold Time 60    Rest Time 10    Time 15 min      Manual Therapy   Joint Mobilization side glide to C3-C7; manual traction    Soft tissue mobilization Lt scalenes                    PT Short Term Goals - 05/14/19 0948      PT SHORT TERM GOAL #1   Title Pt will report 25% less neck pain.    Baseline 40%    Status Achieved      PT SHORT TERM GOAL #2   Title ind with intial HEP    Status Achieved             PT Long Term Goals - 06/25/19 4496      PT LONG TERM GOAL #1   Title Pt will report at least 60% less pain when working on the computer and driving    Baseline 75% less    Status Achieved      PT LONG TERM GOAL #3   Title Pt will be I and compliant with HEP.    Status Achieved      PT LONG TERM GOAL #4   Title Pt will demonstrate cervical rotation of at least 65 deg bilaterally  without pain for improved driving    Status Achieved      PT LONG TERM GOAL #5   Title FOTO < or = to 30% limited    Baseline 36% from 49% limited    Status Partially Met      PT LONG TERM GOAL #6   Title Pt will be able to drive to PT or for about 20 minutes without getting increased numbness    Baseline can drive 4 hours and it is manageable    Status Achieved                 Plan - 06/25/19 0839    Clinical Impression Statement Pt did well with review of exercises and added lat pull downs.   He feels comfortable with his exercises and will return to the gym with HEP.  Pt has met or partially met goals and is 75% improved.  dishcarge recommended today    PT Treatment/Interventions Taping;Dry needling;Passive range of motion;Manual techniques;Patient/family education;Therapeutic activities;Therapeutic exercise;Neuromuscular re-education;Electrical Stimulation;Cryotherapy;Biofeedback;ADLs/Self Care Home Management;Iontophoresis 52m/ml Dexamethasone;Ultrasound    PT Next Visit Plan d/c today    PT Home Exercise Plan Access Code: [email or web address]   Consulted and Agree with Plan of Care Patient           Patient will benefit from skilled therapeutic intervention in order to improve the following deficits and impairments:  Postural dysfunction, Decreased strength, Pain, Increased muscle spasms, Decreased range of motion  Visit Diagnosis: Cervicalgia  Abnormal posture  Muscle weakness (generalized)  Other muscle spasm  Chronic left shoulder pain     Problem List Patient Active Problem List   Diagnosis Date Noted  . Insulin-requiring or dependent type II diabetes mellitus ([place]   . Hyperlipidemia   . Neuropathy   . Genetic testing 08/27/2017  . Family history of colon cancer   . Family history of uterine cancer   . Family history of stomach cancer   . Family history of prostate cancer   . Port-A-Cath in place 06/20/2017  . Rectal cancer s/p robotic LAR rectosigmoid  resection 01/17/2018 06/12/2017  . Other and unspecified hyperlipidemia 06/19/2013    Camillo Flaming Lavine Hargrove 06/25/2019, 8:43 AM  [facility] [address], [place], [place], [address] Phone: [phone]   Fax:  [phone]  Name: Johnie Makki MRN: [id number] Date of Birth: May 15, 1965  PHYSICAL THERAPY DISCHARGE SUMMARY  Visits from Start of Care: 16  Current functional level related to goals / functional outcomes: See above goals   Remaining deficits: See above goals   Education / Equipment: HEP  Plan: Patient agrees to discharge.  Patient goals were met. Patient is being discharged due to meeting the stated rehab goals.  ?????    [employer], PT 08/17/19 9:01 AM

## 2019-07-06 ENCOUNTER — Other Ambulatory Visit: Payer: Self-pay

## 2019-07-06 ENCOUNTER — Inpatient Hospital Stay: Payer: 59 | Attending: Oncology

## 2019-07-06 DIAGNOSIS — C20 Malignant neoplasm of rectum: Secondary | ICD-10-CM

## 2019-07-06 LAB — BASIC METABOLIC PANEL - CANCER CENTER ONLY
Anion gap: 11 (ref 5–15)
BUN: 9 mg/dL (ref 6–20)
CO2: 25 mmol/L (ref 22–32)
Calcium: 9.4 mg/dL (ref 8.9–10.3)
Chloride: 101 mmol/L (ref 98–111)
Creatinine: 0.93 mg/dL (ref 0.61–1.24)
GFR, Est AFR Am: >60 mL/min (ref >60)
GFR, Estimated: >60 mL/min (ref >60)
Glucose, Bld: 202 mg/dL — ABNORMAL HIGH (ref 70–99)
Potassium: 4 mmol/L (ref 3.5–5.1)
Sodium: 137 mmol/L (ref 135–145)

## 2019-07-06 LAB — CEA (IN HOUSE-CHCC): CEA (CHCC-In House): <1 ng/mL (ref 0.00–5.00)

## 2019-07-09 ENCOUNTER — Inpatient Hospital Stay: Payer: 59 | Attending: Oncology | Admitting: Oncology

## 2019-07-09 ENCOUNTER — Other Ambulatory Visit: Payer: Self-pay

## 2019-07-09 ENCOUNTER — Other Ambulatory Visit: Payer: Self-pay | Admitting: *Deleted

## 2019-07-09 VITALS — BP 144/79 | HR 113 | Temp 97.9°F | Resp 20 | Ht 68.0 in | Wt 219.6 lb

## 2019-07-09 DIAGNOSIS — E1140 Type 2 diabetes mellitus with diabetic neuropathy, unspecified: Secondary | ICD-10-CM | POA: Diagnosis not present

## 2019-07-09 DIAGNOSIS — R194 Change in bowel habit: Secondary | ICD-10-CM | POA: Diagnosis not present

## 2019-07-09 DIAGNOSIS — C20 Malignant neoplasm of rectum: Secondary | ICD-10-CM | POA: Diagnosis not present

## 2019-07-09 MED ORDER — GABAPENTIN 300 MG PO CAPS: 300.0000 mg | ORAL_CAPSULE | Freq: Every evening | ORAL | 3 refills | Status: DC

## 2019-07-09 NOTE — Progress Notes (Signed)
[place] OFFICE PROGRESS NOTE   Diagnosis: Rectal cancer  INTERVAL HISTORY:   Charles [name] returns as scheduled.  He feels well.  Good appetite.  No bleeding.  He continues to have irregular bowel habits, but this has improved since completing pelvic physical therapy.  He has persistent numbness and tingling in the hands and feet.  This has improved compared to when he was last here, but is sometimes bothersome at night.  Gabapentin caused somnolence, but helped his symptoms.  Objective:  Vital signs in last 24 hours:  Blood pressure (!) 144/79, pulse (!) 113, temperature 97.9 F (36.6 C), temperature source Temporal, resp. rate 20, height _0  (1.727 m), weight 219 lb 9.6 oz (99.6 kg), SpO2 100 %.    Lymphatics: No cervical, supraclavicular, axillary, or inguinal nodes Resp: Lungs clear bilaterally Cardio: Regular rate and rhythm GI: No hepatosplenomegaly, no mass, nontender Vascular: No leg edema      Lab Results:  Lab Results  Component Value Date   WBC 5.1 02/17/2018   HGB 12.8 (L) 02/17/2018   HCT 38.4 (L) 02/17/2018   MCV 94.3 02/17/2018   PLT 175 02/17/2018   NEUTROABS 4.1 02/17/2018    CMP  Lab Results  Component Value Date   NA 137 07/06/2019   K 4.0 07/06/2019   CL 101 07/06/2019   CO2 25 07/06/2019   GLUCOSE 202 (H) 07/06/2019   BUN 9 07/06/2019   CREATININE 0.93 07/06/2019   CALCIUM 9.4 07/06/2019   PROT 7.4 04/04/2018   ALBUMIN 3.8 04/04/2018   AST 11 (L) 04/04/2018   ALT 16 04/04/2018   ALKPHOS 123 04/04/2018   BILITOT 0.6 04/04/2018   GFRNONAA >60 07/06/2019   GFRAA >60 07/06/2019    Lab Results  Component Value Date   CEA1 <1.00 07/06/2019     Medications: I have reviewed the patient's current medications.   Assessment/Plan: 1. Rectal cancer, clinical stage III ? Colonoscopy 05/24/2017, mass at 12 cm from the anal verge, biopsy confirmed invasive poorly differentiated adenocarcinoma ? CTs 06/06/2017-rectal mass,  perirectal adenopathy, no evidence of metastatic disease, nonspecific 1 cm sclerotic lesion at the right iliac crest ? MRI 06/06/2017, T3cN1tumor measured at 5.3 cm from the anal sphincter ? Cycle 1 FOLFOX 06/20/2017 ? Cycle 2 FOLFOX 07/04/2017 ? Cycle 3 FOLFOX 07/17/2017 ? Cycle 4 FOLFOX 08/01/2017 (Emend added secondary to prolonged nausea following chemotherapy) ? Cycle 5 FOLFOX 08/15/2017 ? Cycle 6 FOLFOX 08/29/2017 ? Cycle 7 FOLFOX 09/12/2017 ? Cycle 8 FOLFOX 09/26/2017 (dose reduced due to neuropathy and thrombocytopenia) ? Initiation of radiation and capecitabine 10/16/2017, completed 11/22/2017 ? Restaging CTs 12/10/2017- no evidence of metastatic disease, decrease in the rectal mass and near complete resolution of perirectal lymphadenopathy  ? Status post low anterior resection 01/17/2018-1 cm poorly differentiated adenocarcinoma; tumor limited to submucosa; margins negative; metastatic carcinoma in 1 of 18 lymph nodes; isolated tumor cells in 1 lymph node; treatment effect present; no loss of mismatch repair protein expression  ? CTs 07/04/2018-no evidence of recurrent disease ? Colonoscopy 06/24/2018-polyps removed from the ascending and descending colon, tubular adenoma and a benign submucosal myoma  2. Diabetes 3. Glaucoma 4. Cecal polyp, tubular adenoma noted on the colonoscopy 05/24/2017 5. Port-A-Cath placement,Dr. White,06/17/2017 6. Oxaliplatinneuropathy-improved      Disposition: Charles [name] is in clinical remission from rectal cancer.  Restaging CTs were ordered for this week, but not scheduled.  We will make sure the CTs are scheduled for within the next few weeks.  He will return for an office visit and CEA in 6 months.  Charles [name] will use gabapentin at night as needed.  Betsy Coder, MD  07/09/2019  3:54 PM

## 2019-07-10 ENCOUNTER — Telehealth: Payer: Self-pay | Admitting: *Deleted

## 2019-07-10 ENCOUNTER — Telehealth: Payer: Self-pay | Admitting: Oncology

## 2019-07-10 NOTE — Telephone Encounter (Signed)
Scheduled appts per 7/1 los. Pt confirmed appt date and time.

## 2019-07-10 NOTE — Telephone Encounter (Signed)
Called patient to alert him that CT scan has been authorized. Provided phone # for central scheduling and he will call to schedule the scan.

## 2019-07-21 ENCOUNTER — Ambulatory Visit (HOSPITAL_COMMUNITY): Payer: 59

## 2019-07-29 ENCOUNTER — Encounter (HOSPITAL_COMMUNITY): Payer: Self-pay

## 2019-07-29 ENCOUNTER — Other Ambulatory Visit: Payer: Self-pay

## 2019-07-29 ENCOUNTER — Ambulatory Visit (HOSPITAL_COMMUNITY)
Admission: RE | Admit: 2019-07-29 | Discharge: 2019-07-29 | Disposition: A | Discharge status: Home / Self Care | Payer: 59 | Source: Ambulatory Visit | Attending: Oncology | Admitting: Oncology

## 2019-07-29 DIAGNOSIS — C20 Malignant neoplasm of rectum: Secondary | ICD-10-CM | POA: Insufficient documentation

## 2019-07-29 MED ORDER — SODIUM CHLORIDE (PF) 0.9 % IJ SOLN
INTRAMUSCULAR | Status: AC
  Filled 2019-07-29: qty 50

## 2019-07-29 MED ORDER — IOHEXOL 300 MG/ML  SOLN
100.0000 mL | Freq: Once | INTRAMUSCULAR | Status: AC | PRN
  Administered 2019-07-29: 100 mL via INTRAVENOUS

## 2019-07-30 ENCOUNTER — Telehealth: Payer: Self-pay | Admitting: *Deleted

## 2019-07-30 NOTE — Telephone Encounter (Signed)
-----   Message from Ladell Pier, MD sent at 07/30/2019  7:13 AM EDT ----- Please call patient, Cts are negative , f/u as scheduled

## 2019-07-30 NOTE — Telephone Encounter (Signed)
Notified of negative CT results-f/u as scheduled. He had already seen report on mychart.

## 2020-01-07 ENCOUNTER — Inpatient Hospital Stay: Payer: 59

## 2020-01-07 ENCOUNTER — Telehealth: Payer: Self-pay | Admitting: Oncology

## 2020-01-07 ENCOUNTER — Other Ambulatory Visit: Payer: Self-pay

## 2020-01-07 ENCOUNTER — Inpatient Hospital Stay: Payer: 59 | Attending: Oncology | Admitting: Oncology

## 2020-01-07 VITALS — BP 135/74 | HR 99 | Temp 97.7°F | Resp 18 | Wt 216.5 lb

## 2020-01-07 DIAGNOSIS — C20 Malignant neoplasm of rectum: Secondary | ICD-10-CM

## 2020-01-07 DIAGNOSIS — E119 Type 2 diabetes mellitus without complications: Secondary | ICD-10-CM | POA: Insufficient documentation

## 2020-01-07 DIAGNOSIS — Z23 Encounter for immunization: Secondary | ICD-10-CM

## 2020-01-07 LAB — CEA (IN HOUSE-CHCC): CEA (CHCC-In House): 1.33 ng/mL (ref 0.00–5.00)

## 2020-01-07 NOTE — Telephone Encounter (Signed)
Scheduled appointments per 12/30 los. Spoke to patient who is aware of appointments dates and times. Gave patient calendar print out.  

## 2020-01-07 NOTE — Progress Notes (Signed)
  [place] OFFICE PROGRESS NOTE   Diagnosis: Rectal cancer  INTERVAL HISTORY:   Charles Hustead returns as scheduled.  He feels well.  Good appetite and energy level.  He continues to have frequent bowel movements, but this has improved.  No incontinence.  No bleeding.  Neuropathy symptoms have improved.  Objective:  Vital signs in last 24 hours:  Blood pressure 135/74, pulse 99, temperature 97.7 F (36.5 C), resp. rate 18, weight 216 lb 8 oz (98.2 kg), SpO2 100 %.    Lymphatics: No cervical, supraclavicular, axillary, or inguinal nodes Resp: Lungs clear bilaterally Cardio: Regular rate and rhythm GI: No hepatosplenomegaly, no mass, nontender Vascular: No leg edema  Lab Results:  Lab Results  Component Value Date   WBC 5.1 02/17/2018   HGB 12.8 (L) 02/17/2018   HCT 38.4 (L) 02/17/2018   MCV 94.3 02/17/2018   PLT 175 02/17/2018   NEUTROABS 4.1 02/17/2018    CMP  Lab Results  Component Value Date   NA 137 07/06/2019   K 4.0 07/06/2019   CL 101 07/06/2019   CO2 25 07/06/2019   GLUCOSE 202 (H) 07/06/2019   BUN 9 07/06/2019   CREATININE 0.93 07/06/2019   CALCIUM 9.4 07/06/2019   PROT 7.4 04/04/2018   ALBUMIN 3.8 04/04/2018   AST 11 (L) 04/04/2018   ALT 16 04/04/2018   ALKPHOS 123 04/04/2018   BILITOT 0.6 04/04/2018   GFRNONAA >60 07/06/2019   GFRAA >60 07/06/2019    Lab Results  Component Value Date   CEA1 1.33 01/07/2020    Medications: I have reviewed the patient's current medications.   Assessment/Plan: 1. Rectal cancer, clinical stage III ? Colonoscopy 05/24/2017, mass at 12 cm from the anal verge, biopsy confirmed invasive poorly differentiated adenocarcinoma ? CTs 06/06/2017-rectal mass, perirectal adenopathy, no evidence of metastatic disease, nonspecific 1 cm sclerotic lesion at the right iliac crest ? MRI 06/06/2017, T3cN1tumor measured at 5.3 cm from the anal sphincter ? Cycle 1 FOLFOX 06/20/2017 ? Cycle 2 FOLFOX 07/04/2017 ? Cycle  3 FOLFOX 07/17/2017 ? Cycle 4 FOLFOX 08/01/2017 (Emend added secondary to prolonged nausea following chemotherapy) ? Cycle 5 FOLFOX 08/15/2017 ? Cycle 6 FOLFOX 08/29/2017 ? Cycle 7 FOLFOX 09/12/2017 ? Cycle 8 FOLFOX 09/26/2017 (dose reduced due to neuropathy and thrombocytopenia) ? Initiation of radiation and capecitabine 10/16/2017, completed 11/22/2017 ? Restaging CTs 12/10/2017- no evidence of metastatic disease, decrease in the rectal mass and near complete resolution of perirectal lymphadenopathy  ? Status post low anterior resection 01/17/2018-1 cm poorly differentiated adenocarcinoma; tumor limited to submucosa; margins negative; metastatic carcinoma in 1 of 18 lymph nodes; isolated tumor cells in 1 lymph node; treatment effect present; no loss of mismatch repair protein expression  ? CTs 07/04/2018-no evidence of recurrent disease ? Colonoscopy 06/24/2018-polyps removed from the ascending and descending colon, tubular adenoma and a benign submucosal myoma  2. Diabetes 3. Glaucoma 4. Cecal polyp, tubular adenoma noted on the colonoscopy 05/24/2017 5. Port-A-Cath placement,Dr. White,06/17/2017 6. Oxaliplatinneuropathy-improved     Disposition:  Charles [name] is in remission from rectal cancer.  He will return for an office visit and restaging CTs in 6 months.  He continues colonoscopy surveillance with Dr. Michail Sermon.  He received a COVID-19 booster vaccine today.  Betsy Coder, MD  01/07/2020  11:35 AM

## 2020-07-07 ENCOUNTER — Ambulatory Visit (HOSPITAL_BASED_OUTPATIENT_CLINIC_OR_DEPARTMENT_OTHER)
Admission: RE | Admit: 2020-07-07 | Discharge: 2020-07-07 | Disposition: A | Discharge status: Home / Self Care | Payer: 59 | Source: Ambulatory Visit | Attending: Oncology | Admitting: Oncology

## 2020-07-07 ENCOUNTER — Other Ambulatory Visit: Payer: Self-pay

## 2020-07-07 ENCOUNTER — Encounter (HOSPITAL_BASED_OUTPATIENT_CLINIC_OR_DEPARTMENT_OTHER): Payer: Self-pay

## 2020-07-07 ENCOUNTER — Other Ambulatory Visit: Payer: 59

## 2020-07-07 ENCOUNTER — Encounter: Payer: Self-pay | Admitting: Oncology

## 2020-07-07 ENCOUNTER — Inpatient Hospital Stay: Payer: 59 | Attending: Oncology

## 2020-07-07 DIAGNOSIS — C20 Malignant neoplasm of rectum: Secondary | ICD-10-CM | POA: Diagnosis present

## 2020-07-07 LAB — BASIC METABOLIC PANEL - CANCER CENTER ONLY
Anion gap: 8 (ref 5–15)
BUN: 12 mg/dL (ref 6–20)
CO2: 28 mmol/L (ref 22–32)
Calcium: 9.8 mg/dL (ref 8.9–10.3)
Chloride: 99 mmol/L (ref 98–111)
Creatinine: 0.88 mg/dL (ref 0.61–1.24)
GFR, Estimated: >60 mL/min (ref >60)
Glucose, Bld: 168 mg/dL — ABNORMAL HIGH (ref 70–99)
Potassium: 4.1 mmol/L (ref 3.5–5.1)
Sodium: 135 mmol/L (ref 135–145)

## 2020-07-07 LAB — CEA (IN HOUSE-CHCC): CEA (CHCC-In House): 1.28 ng/mL (ref 0.00–5.00)

## 2020-07-07 LAB — CEA (ACCESS): CEA (CHCC): 1.07 ng/mL (ref 0.00–5.00)

## 2020-07-07 LAB — POCT I-STAT CREATININE: Creatinine, Ser: 0.7 mg/dL (ref 0.61–1.24)

## 2020-07-07 MED ORDER — IOHEXOL 300 MG/ML  SOLN
100.0000 mL | Freq: Once | INTRAMUSCULAR | Status: AC | PRN
  Administered 2020-07-07: 100 mL via INTRAVENOUS

## 2020-07-08 ENCOUNTER — Inpatient Hospital Stay: Payer: 59 | Attending: Oncology | Admitting: Oncology

## 2020-07-08 VITALS — BP 131/77 | HR 99 | Temp 97.8°F | Resp 20 | Ht 68.0 in | Wt 219.0 lb

## 2020-07-08 DIAGNOSIS — E119 Type 2 diabetes mellitus without complications: Secondary | ICD-10-CM | POA: Insufficient documentation

## 2020-07-08 DIAGNOSIS — C20 Malignant neoplasm of rectum: Secondary | ICD-10-CM | POA: Insufficient documentation

## 2020-07-08 NOTE — Progress Notes (Signed)
[place] OFFICE PROGRESS NOTE   Diagnosis: Rectal cancer  INTERVAL HISTORY:    Charles [name] returns as scheduled.  He feels well.  Good appetite.  He has 2-3 bowel movements per day.  No bleeding.  Mild remaining neuropathy symptoms in the feet.  This does not interfere with activity.  Objective:  Vital signs in last 24 hours:  Blood pressure 131/77, pulse 99, temperature 97.8 F (36.6 C), temperature source Oral, resp. rate 20, height '5\' 8"'  (1.727 m), weight 219 lb (99.3 kg), SpO2 100 %.    Lymphatics: No cervical, supraclavicular, axillary, or inguinal nodes Resp: Lungs clear bilaterally Cardio: Regular rate and rhythm GI: No mass, nontender, no hepatosplenomegaly Vascular: No leg edema   Lab Results:  Lab Results  Component Value Date   WBC 5.1 02/17/2018   HGB 12.8 (L) 02/17/2018   HCT 38.4 (L) 02/17/2018   MCV 94.3 02/17/2018   PLT 175 02/17/2018   NEUTROABS 4.1 02/17/2018    CMP  Lab Results  Component Value Date   NA 135 07/07/2020   K 4.1 07/07/2020   CL 99 07/07/2020   CO2 28 07/07/2020   GLUCOSE 168 (H) 07/07/2020   BUN 12 07/07/2020   CREATININE 0.88 07/07/2020   CALCIUM 9.8 07/07/2020   PROT 7.4 04/04/2018   ALBUMIN 3.8 04/04/2018   AST 11 (L) 04/04/2018   ALT 16 04/04/2018   ALKPHOS 123 04/04/2018   BILITOT 0.6 04/04/2018   GFRNONAA >60 07/07/2020   GFRAA >60 07/06/2019    Lab Results  Component Value Date   CEA1 1.28 07/07/2020    Lab Results  Component Value Date   INR 0.99 02/17/2018    Imaging:  CT CHEST ABDOMEN PELVIS W CONTRAST  Result Date: 07/07/2020 CLINICAL DATA:  Colorectal cancer surveillance in a 55 year old male. EXAM: CT CHEST, ABDOMEN, AND PELVIS WITH CONTRAST TECHNIQUE: Multidetector CT imaging of the chest, abdomen and pelvis was performed following the standard protocol during bolus administration of intravenous contrast. CONTRAST:  150m OMNIPAQUE IOHEXOL 300 MG/ML  SOLN COMPARISON:  Examination  of July 29, 2019. FINDINGS: CT CHEST FINDINGS Cardiovascular: Stable appearance of cardiac structures. No pericardial effusion. 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 Mediastinum/Nodes: Mildly patulous esophagus unchanged. No thoracic inlet or axillary lymphadenopathy. No mediastinal lymphadenopathy. No hilar lymphadenopathy. Lungs/Pleura: No effusion. No consolidation. Airways are patent. No suspicious mass or nodule. Musculoskeletal: See below for full musculoskeletal details. No acute musculoskeletal findings are destructive bone process about the bony thorax. CT ABDOMEN PELVIS FINDINGS Hepatobiliary: Signs of hepatic steatosis with similar appearance, at least moderate steatosis. Areas of fat intensification/pseudo lesion along the fissure for false form ligament. No pericholecystic stranding. No biliary duct dilation. No suspicious lesion. Pancreas: Normal, without mass, inflammation or ductal dilatation. Spleen: Spleen normal size and contour. Adrenals/Urinary Tract: Adrenal glands are normal. Symmetric renal enhancement. Smooth contour the urinary bladder. No hydronephrosis. No perinephric stranding. No suspicious renal lesion. Stomach/Bowel: No acute gastrointestinal process. Signs of rectal anastomosis with stable appearance of surrounding post treatment related changes and presacral stranding. No signs to suggest recurrence locally. Vascular/Lymphatic: Normal caliber of the abdominal aorta and IVC. There is no gastrohepatic or hepatoduodenal ligament lymphadenopathy. No retroperitoneal or mesenteric lymphadenopathy. No pelvic sidewall lymphadenopathy. Reproductive: Prostate unremarkable by CT. Other: No free air or ascites. Musculoskeletal: No acute or destructive bone process. IMPRESSION: 1. Signs of rectal anastomosis with stable appearance of surrounding post treatment related changes and presacral stranding. No signs to  suggest recurrence locally recurrent or metastatic  disease. 2. Signs of hepatic steatosis with similar appearance, at least moderate steatosis. Electronically Signed   By: Zetta Bills M.D.   On: 07/07/2020 16:17    Medications: I have reviewed the patient's current medications.   Assessment/Plan: Rectal cancer, clinical stage III Colonoscopy 05/24/2017, mass at 12 cm from the anal verge, biopsy confirmed invasive poorly differentiated adenocarcinoma CTs 06/06/2017-rectal mass, perirectal adenopathy, no evidence of metastatic disease, nonspecific 1 cm sclerotic lesion at the right iliac crest MRI 06/06/2017, T3cN1 tumor measured at 5.3 cm from the anal sphincter Cycle 1 FOLFOX 06/20/2017 Cycle 2 FOLFOX 07/04/2017 Cycle 3 FOLFOX 07/17/2017 Cycle 4 FOLFOX 08/01/2017 (Emend added secondary to prolonged nausea following chemotherapy) Cycle 5 FOLFOX 08/15/2017 Cycle 6 FOLFOX 08/29/2017 Cycle 7 FOLFOX 09/12/2017 Cycle 8 FOLFOX 09/26/2017 (dose reduced due to neuropathy and thrombocytopenia) Initiation of radiation and capecitabine 10/16/2017, completed 11/22/2017 Restaging CTs 12/10/2017- no evidence of metastatic disease, decrease in the rectal mass and near complete resolution of perirectal lymphadenopathy  Status post low anterior resection 01/17/2018-1 cm poorly differentiated adenocarcinoma; tumor limited to submucosa; margins negative; metastatic carcinoma in 1 of 18 lymph nodes; isolated tumor cells in 1 lymph node; treatment effect present; no loss of mismatch repair protein expression  CTs 07/04/2018-no evidence of recurrent disease Colonoscopy 06/24/2018-polyps removed from the ascending and descending colon, tubular adenoma and a benign submucosal myoma CTs 07/29/2019-no evidence of recurrent disease CT 07/07/2020-no evidence of recurrent disease   Diabetes Glaucoma Cecal polyp, tubular adenoma noted on the colonoscopy 05/24/2017 Port-A-Cath placement, Dr. Dema Severin, 06/17/2017 Oxaliplatin neuropathy-improved   Disposition: Charles [name] remains in  clinical remission from rectal cancer.  He will return for an office visit and CEA in 6 months.  He will be scheduled for a surveillance colonoscopy in 2023.  Betsy Coder, MD  07/08/2020  9:24 AM

## 2020-07-19 ENCOUNTER — Encounter: Payer: Self-pay | Admitting: Genetic Counselor

## 2020-11-04 ENCOUNTER — Encounter: Payer: Self-pay | Admitting: Oncology

## 2020-11-04 ENCOUNTER — Other Ambulatory Visit (HOSPITAL_BASED_OUTPATIENT_CLINIC_OR_DEPARTMENT_OTHER): Payer: Self-pay

## 2020-11-04 ENCOUNTER — Ambulatory Visit: Payer: 59 | Attending: Internal Medicine

## 2020-11-04 ENCOUNTER — Other Ambulatory Visit: Payer: Self-pay

## 2020-11-04 DIAGNOSIS — Z23 Encounter for immunization: Secondary | ICD-10-CM

## 2020-11-04 MED ORDER — PFIZER COVID-19 VAC BIVALENT 30 MCG/0.3ML IM SUSP
INTRAMUSCULAR | 0 refills | Status: DC
  Filled 2020-11-04: qty 0.3, 1d supply, fill #0

## 2020-11-04 NOTE — Progress Notes (Signed)
   Covid-19 Vaccination Clinic  Name:  Charles [name]    MRN: [id number] DOB: April 21, 1965  11/04/2020  [name] was observed post Covid-19 immunization for 15 minutes without incident. He was provided with Vaccine Information Sheet and instruction to access the V-Safe system.   [name] was instructed to call 911 with any severe reactions post vaccine: Difficulty breathing  Swelling of face and throat  A fast heartbeat  A bad rash all over body  Dizziness and weakness   Immunizations Administered     Name Date Dose VIS Date Route   Pfizer Covid-19 Vaccine Bivalent Booster 11/04/2020  2:19 PM 0.3 mL 09/07/2020 Intramuscular   Manufacturer: [place]   Lot: [id number]   [place]: [phone]

## 2021-01-13 ENCOUNTER — Inpatient Hospital Stay: Payer: 59 | Attending: Oncology

## 2021-01-13 ENCOUNTER — Inpatient Hospital Stay: Payer: 59 | Admitting: Oncology

## 2021-01-13 ENCOUNTER — Encounter: Payer: Self-pay | Admitting: *Deleted

## 2021-01-13 ENCOUNTER — Other Ambulatory Visit: Payer: Self-pay

## 2021-01-13 VITALS — BP 138/77 | HR 99 | Temp 97.8°F | Resp 20 | Ht 68.0 in | Wt 216.0 lb

## 2021-01-13 DIAGNOSIS — C20 Malignant neoplasm of rectum: Secondary | ICD-10-CM

## 2021-01-13 LAB — CEA (ACCESS): CEA (CHCC): 1.26 ng/mL (ref 0.00–5.00)

## 2021-01-13 NOTE — Progress Notes (Signed)
[place] OFFICE PROGRESS NOTE   Diagnosis: Rectal cancer  INTERVAL HISTORY:   [name] returns as scheduled.  He feels well.  Good appetite.  Mild persistent neuropathy symptoms.  He continues to have irregular bowel habits in the morning.  Objective:  Vital signs in last 24 hours:  Blood pressure 138/77, pulse 99, temperature 97.8 F (36.6 C), temperature source Oral, resp. rate 20, height $RemoveBe'5\' 8"'VHKVyYels$  (1.727 m), weight 216 lb (98 kg), SpO2 100 %.    Lymphatics: No cervical, supraclavicular, axillary, or inguinal nodes Resp: Lungs clear bilaterally Cardio: Regular rate and rhythm GI: No hepatosplenomegaly, no mass, nontender Vascular: No leg edema    Lab Results:  Lab Results  Component Value Date   WBC 5.1 02/17/2018   HGB 12.8 (L) 02/17/2018   HCT 38.4 (L) 02/17/2018   MCV 94.3 02/17/2018   PLT 175 02/17/2018   NEUTROABS 4.1 02/17/2018    CMP  Lab Results  Component Value Date   NA 135 07/07/2020   K 4.1 07/07/2020   CL 99 07/07/2020   CO2 28 07/07/2020   GLUCOSE 168 (H) 07/07/2020   BUN 12 07/07/2020   CREATININE 0.88 07/07/2020   CALCIUM 9.8 07/07/2020   PROT 7.4 04/04/2018   ALBUMIN 3.8 04/04/2018   AST 11 (L) 04/04/2018   ALT 16 04/04/2018   ALKPHOS 123 04/04/2018   BILITOT 0.6 04/04/2018   GFRNONAA >60 07/07/2020   GFRAA >60 07/06/2019    Lab Results  Component Value Date   CEA1 1.28 07/07/2020   CEA 1.07 07/07/2020     Medications: I have reviewed the patient's current medications.   Assessment/Plan: Rectal cancer, clinical stage III Colonoscopy 05/24/2017, mass at 12 cm from the anal verge, biopsy confirmed invasive poorly differentiated adenocarcinoma CTs 06/06/2017-rectal mass, perirectal adenopathy, no evidence of metastatic disease, nonspecific 1 cm sclerotic lesion at the right iliac crest MRI 06/06/2017, T3cN1 tumor measured at 5.3 cm from the anal sphincter Cycle 1 FOLFOX 06/20/2017 Cycle 2 FOLFOX 07/04/2017 Cycle  3 FOLFOX 07/17/2017 Cycle 4 FOLFOX 08/01/2017 (Emend added secondary to prolonged nausea following chemotherapy) Cycle 5 FOLFOX 08/15/2017 Cycle 6 FOLFOX 08/29/2017 Cycle 7 FOLFOX 09/12/2017 Cycle 8 FOLFOX 09/26/2017 (dose reduced due to neuropathy and thrombocytopenia) Initiation of radiation and capecitabine 10/16/2017, completed 11/22/2017 Restaging CTs 12/10/2017- no evidence of metastatic disease, decrease in the rectal mass and near complete resolution of perirectal lymphadenopathy  Status post low anterior resection 01/17/2018-1 cm poorly differentiated adenocarcinoma; tumor limited to submucosa; margins negative; metastatic carcinoma in 1 of 18 lymph nodes; isolated tumor cells in 1 lymph node; treatment effect present; no loss of mismatch repair protein expression  CTs 07/04/2018-no evidence of recurrent disease Colonoscopy 06/24/2018-polyps removed from the ascending and descending colon, tubular adenoma and a benign submucosal myoma CTs 07/29/2019-no evidence of recurrent disease CT 07/07/2020-no evidence of recurrent disease   Diabetes Glaucoma Cecal polyp, tubular adenoma noted on the colonoscopy 05/24/2017 Port-A-Cath placement, Dr. Dema Severin, 06/17/2017 Oxaliplatin neuropathy-improved     Disposition: [name] remains in clinical remission from rectal cancer.  We will follow-up on the CEA from today.  He will return for an office visit and CEA in 6 months.  He will be due for a surveillance colonoscopy this summer.  We will make a referral to Dr. Michail Sermon.  Betsy Coder, MD  01/13/2021  9:18 AM

## 2021-01-13 NOTE — Progress Notes (Unsigned)
Faxed referral order and office note to [facility] for surveillance colonoscopy due the summer 2023.

## 2021-07-17 ENCOUNTER — Telehealth: Payer: Self-pay

## 2021-07-17 ENCOUNTER — Inpatient Hospital Stay: Payer: 59

## 2021-07-17 ENCOUNTER — Inpatient Hospital Stay: Payer: 59 | Attending: Oncology | Admitting: Oncology

## 2021-07-17 VITALS — BP 131/78 | HR 95 | Temp 98.2°F | Resp 18 | Ht 68.0 in | Wt 216.8 lb

## 2021-07-17 DIAGNOSIS — C20 Malignant neoplasm of rectum: Secondary | ICD-10-CM | POA: Diagnosis present

## 2021-07-17 DIAGNOSIS — E119 Type 2 diabetes mellitus without complications: Secondary | ICD-10-CM | POA: Insufficient documentation

## 2021-07-17 DIAGNOSIS — Z923 Personal history of irradiation: Secondary | ICD-10-CM | POA: Insufficient documentation

## 2021-07-17 LAB — CEA (ACCESS): CEA (CHCC): 1.42 ng/mL (ref 0.00–5.00)

## 2021-07-17 NOTE — Progress Notes (Signed)
[place] OFFICE PROGRESS NOTE   Diagnosis: Rectal cancer  INTERVAL HISTORY:   Mr Eberlein returns as scheduled.  He feels well.  Good appetite.  He has 2-3 bowel movements per day.  No difficulty with bowel control.  No bleeding.  He has mild remaining neuropathy symptoms.  He developed a "hip pointer "approximately 1 month ago.  He saw orthopedics.  This has improved.  He is scheduled for colonoscopy next month.  Objective:  Vital signs in last 24 hours:  Blood pressure 131/78, pulse 95, temperature 98.2 F (36.8 C), temperature source Oral, resp. rate 18, height '5\' 8"'  (1.727 m), weight 216 lb 12.8 oz (98.3 kg), SpO2 100 %.    Lymphatics: No cervical, supraclavicular, axillary, or inguinal nodes Resp: Lungs clear bilaterally Cardio: Regular rate and rhythm GI: No hepatosplenomegaly, no mass, nontender Vascular: No leg edema   Lab Results:  Lab Results  Component Value Date   WBC 5.1 02/17/2018   HGB 12.8 (L) 02/17/2018   HCT 38.4 (L) 02/17/2018   MCV 94.3 02/17/2018   PLT 175 02/17/2018   NEUTROABS 4.1 02/17/2018    CMP  Lab Results  Component Value Date   NA 135 07/07/2020   K 4.1 07/07/2020   CL 99 07/07/2020   CO2 28 07/07/2020   GLUCOSE 168 (H) 07/07/2020   BUN 12 07/07/2020   CREATININE 0.88 07/07/2020   CALCIUM 9.8 07/07/2020   PROT 7.4 04/04/2018   ALBUMIN 3.8 04/04/2018   AST 11 (L) 04/04/2018   ALT 16 04/04/2018   ALKPHOS 123 04/04/2018   BILITOT 0.6 04/04/2018   GFRNONAA >60 07/07/2020   GFRAA >60 07/06/2019    Lab Results  Component Value Date   CEA1 1.28 07/07/2020   CEA 1.26 01/13/2021    Medications: I have reviewed the patient's current medications.   Assessment/Plan: Rectal cancer, clinical stage III Colonoscopy 05/24/2017, mass at 12 cm from the anal verge, biopsy confirmed invasive poorly differentiated adenocarcinoma CTs 06/06/2017-rectal mass, perirectal adenopathy, no evidence of metastatic disease,  nonspecific 1 cm sclerotic lesion at the right iliac crest MRI 06/06/2017, T3cN1 tumor measured at 5.3 cm from the anal sphincter Cycle 1 FOLFOX 06/20/2017 Cycle 2 FOLFOX 07/04/2017 Cycle 3 FOLFOX 07/17/2017 Cycle 4 FOLFOX 08/01/2017 (Emend added secondary to prolonged nausea following chemotherapy) Cycle 5 FOLFOX 08/15/2017 Cycle 6 FOLFOX 08/29/2017 Cycle 7 FOLFOX 09/12/2017 Cycle 8 FOLFOX 09/26/2017 (dose reduced due to neuropathy and thrombocytopenia) Initiation of radiation and capecitabine 10/16/2017, completed 11/22/2017 Restaging CTs 12/10/2017- no evidence of metastatic disease, decrease in the rectal mass and near complete resolution of perirectal lymphadenopathy  Status post low anterior resection 01/17/2018-1 cm poorly differentiated adenocarcinoma; tumor limited to submucosa; margins negative; metastatic carcinoma in 1 of 18 lymph nodes; isolated tumor cells in 1 lymph node; treatment effect present; no loss of mismatch repair protein expression  CTs 07/04/2018-no evidence of recurrent disease Colonoscopy 06/24/2018-polyps removed from the ascending and descending colon, tubular adenoma and a benign submucosal myoma CTs 07/29/2019-no evidence of recurrent disease CT 07/07/2020-no evidence of recurrent disease   Diabetes Glaucoma Cecal polyp, tubular adenoma noted on the colonoscopy 05/24/2017 Port-A-Cath placement, Dr. Dema Severin, 06/17/2017 Oxaliplatin neuropathy-improved      Disposition:  Mr Gibbon is in clinical remission from rectal cancer.  We will follow-up on the CEA from today.  He will return for an office visit and CEA in 6 months.  He is scheduled for a surveillance colonoscopy next month.   Betsy Coder, MD  07/17/2021  9:23 AM

## 2021-07-17 NOTE — Telephone Encounter (Signed)
-----   Message from Ladell Pier, MD sent at 07/17/2021  4:23 PM EDT ----- Please call patient, CEA is normal, follow-up as scheduled

## 2021-07-17 NOTE — Telephone Encounter (Signed)
Pt verbalized understanding.

## 2021-07-19 ENCOUNTER — Ambulatory Visit: Payer: 59 | Attending: Family Medicine

## 2021-07-19 ENCOUNTER — Other Ambulatory Visit: Payer: Self-pay

## 2021-07-19 DIAGNOSIS — M25852 Other specified joint disorders, left hip: Secondary | ICD-10-CM | POA: Insufficient documentation

## 2021-07-19 DIAGNOSIS — S76012A Strain of muscle, fascia and tendon of left hip, initial encounter: Secondary | ICD-10-CM | POA: Diagnosis not present

## 2021-07-19 DIAGNOSIS — M6281 Muscle weakness (generalized): Secondary | ICD-10-CM | POA: Insufficient documentation

## 2021-07-19 DIAGNOSIS — X58XXXA Exposure to other specified factors, initial encounter: Secondary | ICD-10-CM | POA: Insufficient documentation

## 2021-07-19 DIAGNOSIS — R252 Cramp and spasm: Secondary | ICD-10-CM

## 2021-07-19 DIAGNOSIS — R262 Difficulty in walking, not elsewhere classified: Secondary | ICD-10-CM | POA: Insufficient documentation

## 2021-07-19 DIAGNOSIS — M25552 Pain in left hip: Secondary | ICD-10-CM

## 2021-07-19 NOTE — Therapy (Signed)
OUTPATIENT PHYSICAL THERAPY LOWER EXTREMITY EVALUATION   [name] MRN: [id number] DOB:[birth year], 56 y.o., male Today's Date: 07/19/2021   PT End of Session - 07/19/21 0857     Visit Number 1    Date for PT Re-Evaluation 09/13/21    Authorization Type [employer]    PT Start Time [phone]    PT Stop Time 0930    PT Time Calculation (min) 43 min    Activity Tolerance Patient tolerated treatment well    Behavior During Therapy WFL for tasks assessed/performed             Past Medical History:  Diagnosis Date   Diabetes mellitus without complication ([employer])    Type II   Family history of colon cancer    Family history of prostate cancer    Family history of stomach cancer    Family history of uterine cancer    History of chemotherapy LAST CHEMO NOV 2019   Hyperlipidemia    Neuropathy    FEET AND HANDS PAIN IN LEGS AT TIMES   Past Surgical History:  Procedure Laterality Date   ACHILLES TENDON REPAIR Right 2005   APPENDECTOMY     CYSTOSCOPY WITH STENT PLACEMENT Bilateral 01/17/2018   Procedure: CYSTOSCOPY WITH STENT PLACEMENT;  Surgeon: Kathie Rhodes, MD;  Location: [facility];  Service: Urology;  Laterality: Bilateral;   FLEXIBLE SIGMOIDOSCOPY N/A 01/17/2018   Procedure: FLEXIBLE SIGMOIDOSCOPY;  Surgeon: Ileana Roup, MD;  Location: [facility];  Service: General;  Laterality: N/A;   HERNIA REPAIR     as a baby   ILEOSTOMY N/A 01/17/2018   Procedure: possible diverting LOOP ILEOSTOMY;  Surgeon: Ileana Roup, MD;  Location: [facility];  Service: General;  Laterality: N/A;   PORT-A-CATH REMOVAL Right 02/17/2018   Procedure: REMOVAL OF PORT-A-CATH;  Surgeon: Ileana Roup, MD;  Location: [facility];  Service: General;  Laterality: Right;   PORTACATH PLACEMENT N/A 06/17/2017   Procedure: RIGHT VS LEFT INTERNAL JUGULAR PORT-A-CATH PLACEMENT WITH ULTRASOUND;  Surgeon: Ileana Roup, MD;  Location: [facility];  Service: General;  Laterality: N/A;  FLURO    Patient Active Problem List   Diagnosis Date Noted   Insulin-requiring or dependent type II diabetes mellitus ([place])    Hyperlipidemia    Neuropathy    Genetic testing 08/27/2017   Family history of colon cancer    Family history of uterine cancer    Family history of stomach cancer    Family history of prostate cancer    Port-A-Cath in place 06/20/2017   Rectal cancer s/p robotic LAR rectosigmoid resection 01/17/2018 06/12/2017   Other and unspecified hyperlipidemia 06/19/2013    PCP: Lujean Amel, MD  REFERRING PROVIDER: Gentry Fitz, MD   REFERRING DIAG: Left hip strain and impingement   THERAPY DIAG:  Pain in left hip  Cramp and spasm  Difficulty in walking, not elsewhere classified  Muscle weakness (generalized)  Rationale for Evaluation and Treatment Rehabilitation  ONSET DATE: 07/12/21  SUBJECTIVE:   SUBJECTIVE STATEMENT: Patient reports he was unloading a scooter for his elderly mother in law and then had to unload another for another elderly family member when he felt some pain in his left hip (patient points to just below the iliac crest).  This was about 3 months ago and he has since gone through treatment for rectal cancer.  He is in remission at this time.  He states his pain in fairly constant but worsens with activities such as tying  his shoes, putting socks on or putting lotion on his feet.  He hopes to lessen his pain and be able to do routine daily activities without restriction.    PERTINENT HISTORY: Recent rectal cancer/ in remission  PAIN:  Are you having pain? Yes: NPRS scale: 5/10 Pain location: just below pelvic rim Pain description: sharp Aggravating factors: putting shoes and socks, bending, stooping and squatting Relieving factors: meds  PRECAUTIONS: None  WEIGHT BEARING RESTRICTIONS No  FALLS:  Has patient fallen in last 6 months? No  LIVING ENVIRONMENT: Lives with: lives with their family and lives with their spouse Lives  in: House/apartment Stairs: Yes: Internal: 12 steps; can reach both and External: 5 steps; can reach both Has following equipment at home: None  OCCUPATION: desk job/sitting at computer all day  PLOF: Independent  PATIENT GOALS to no longer have pain   OBJECTIVE:   DIAGNOSTIC FINDINGS: none  PATIENT SURVEYS:  FOTO 59, goal is 14  COGNITION:  Overall cognitive status: Within functional limits for tasks assessed     SENSATION: WFL   MUSCLE LENGTH: Hamstrings: Right 50 deg; Left 50 deg Thomas test: Right pos ; Left pos   POSTURE: rounded shoulders, forward head, and decreased lumbar lordosis  PALPATION: Tender to palpation per patient at just below iliac crest of left hip  LOWER EXTREMITY ROM:  WFL  LOWER EXTREMITY MMT:  Generally 4 to 4+/5  LOWER EXTREMITY SPECIAL TESTS:  Hip special tests: Saralyn Pilar (FABER) test: positive  and Thomas test: positive   FUNCTIONAL TESTS:  5 times sit to stand: 9.66 sec Timed up and go (TUG): 6.44 sec  GAIT: Distance walked: 50 Assistive device utilized: None Level of assistance: Complete Independence Comments: antalgic gait, limp    TODAY'S TREATMENT: Initial eval completed and initiated HEP   PATIENT EDUCATION:  Education details: Initiated HEP, educated on anatomy of the hip and s/s of hip pointer Person educated: Patient Education method: Consulting civil engineer, Media planner, Verbal cues, and Handouts Education comprehension: verbalized understanding, returned demonstration, and verbal cues required   HOME EXERCISE PROGRAM: Access Code: [email or web address] URL: [email or web address] Date: 07/19/2021 Prepared by: Candyce Churn  Exercises - Supine Hamstring Stretch with Strap  - 1 x daily - 7 x weekly - 1 sets - 3 reps - 30 sec hold - Supine ITB Stretch with Strap  - 1 x daily - 7 x weekly - 1 sets - 3 reps - 30 sec hold - Supine Figure 4 Piriformis Stretch  - 1 x daily - 7 x weekly - 1 sets - 3 reps - 30 sec hold -  Supine Piriformis Stretch Pulling Heel to Hip  - 1 x daily - 7 x weekly - 1 sets - 3 reps - 30 sec hold  ASSESSMENT:  CLINICAL IMPRESSION: Patient is a 56 y.o. male who was seen today for physical therapy evaluation and treatment for left hip pointer symptoms. He presents with tenderness to palpation at right hip just below iliac crest.  He has significant flexibility issues bilateral hips and weakness bilateral hip abd and extension.     OBJECTIVE IMPAIRMENTS Abnormal gait, difficulty walking, decreased ROM, decreased strength, hypomobility, increased fascial restrictions, increased muscle spasms, impaired flexibility, and pain.   ACTIVITY LIMITATIONS carrying, lifting, bending, sitting, standing, squatting, sleeping, stairs, transfers, and dressing  PARTICIPATION LIMITATIONS: cleaning, laundry, driving, shopping, occupation, and yard work  PERSONAL FACTORS Fitness, Profession, and Time since onset of injury/illness/exacerbation are also affecting patient's functional outcome.   REHAB POTENTIAL: Good  CLINICAL DECISION  MAKING: Stable/uncomplicated  EVALUATION COMPLEXITY: Low   GOALS: Goals reviewed with patient? Yes  SHORT TERM GOALS: Target date: 09/13/2021  Patient will be independent with initial HEP  Baseline: Goal status: INITIAL  2.  Pain report to be no greater than 4/10  Baseline:  Goal status: INITIAL  3.  Normal heel to toe gait Baseline:  Goal status: INITIAL   LONG TERM GOALS: Target date: 09/13/2021   Patient to be independent with advanced HEP  Baseline:  Goal status: INITIAL  2.  Patient to report pain no greater than 2/10  Baseline:  Goal status: INITIAL  3.  Patient to be able to sleep through the night  Baseline:  Goal status: INITIAL  4.  FOTO to be 36 Baseline:  Goal status: INITIAL  5.  Patient to be able to don/doff shoes and socks and apply lotion to his feet without pain Baseline:  Goal status: INITIAL  6.  Patient to report 85%  improvement in overall symptoms.  Baseline:  Goal status: INITIAL   PLAN: PT FREQUENCY: 1-2x/week  PT DURATION: 8 weeks  PLANNED INTERVENTIONS: Therapeutic exercises, Therapeutic activity, Neuromuscular re-education, Balance training, Gait training, Patient/Family education, Self Care, Joint mobilization, Stair training, Aquatic Therapy, Dry Needling, Electrical stimulation, Cryotherapy, Moist heat, Taping, Ultrasound, Ionotophoresis '4mg'$ /ml Dexamethasone, Manual therapy, and Re-evaluation  PLAN FOR NEXT SESSION: Review HEP, Nustep, 4 way hip, lunges, lateral band walks   Ellis Mehaffey B. Tracen Mahler, PT 07/19/21 3:33 PM  [facility] [address], [place], [place] [address] Phone # [phone] Fax [phone]

## 2021-07-21 ENCOUNTER — Ambulatory Visit: Payer: 59

## 2021-07-21 DIAGNOSIS — S76012A Strain of muscle, fascia and tendon of left hip, initial encounter: Secondary | ICD-10-CM | POA: Diagnosis not present

## 2021-07-21 DIAGNOSIS — M6281 Muscle weakness (generalized): Secondary | ICD-10-CM

## 2021-07-21 DIAGNOSIS — M25552 Pain in left hip: Secondary | ICD-10-CM

## 2021-07-21 DIAGNOSIS — R252 Cramp and spasm: Secondary | ICD-10-CM

## 2021-07-21 DIAGNOSIS — R262 Difficulty in walking, not elsewhere classified: Secondary | ICD-10-CM

## 2021-07-21 NOTE — Therapy (Signed)
OUTPATIENT PHYSICAL THERAPY TREATMENT NOTE   [name] MRN: [id number] DOB:Jun 15, 1965, 56 y.o., male Today's Date: 07/21/2021  PCP: Lujean Amel, MD REFERRING PROVIDER: Gentry Fitz, MD   END OF SESSION:   PT End of Session - 07/21/21 1016     Visit Number 2    Date for PT Re-Evaluation 09/13/21    Authorization Type Aetna    PT Start Time 1014    PT Stop Time 1055    PT Time Calculation (min) 41 min    Activity Tolerance Patient tolerated treatment well    Behavior During Therapy WFL for tasks assessed/performed             Past Medical History:  Diagnosis Date   Diabetes mellitus without complication ([place])    Type II   Family history of colon cancer    Family history of prostate cancer    Family history of stomach cancer    Family history of uterine cancer    History of chemotherapy LAST CHEMO NOV 2019   Hyperlipidemia    Neuropathy    FEET AND HANDS PAIN IN LEGS AT TIMES   Past Surgical History:  Procedure Laterality Date   ACHILLES TENDON REPAIR Right 2005   APPENDECTOMY     CYSTOSCOPY WITH STENT PLACEMENT Bilateral 01/17/2018   Procedure: CYSTOSCOPY WITH STENT PLACEMENT;  Surgeon: Kathie Rhodes, MD;  Location: [facility];  Service: Urology;  Laterality: Bilateral;   FLEXIBLE SIGMOIDOSCOPY N/A 01/17/2018   Procedure: FLEXIBLE SIGMOIDOSCOPY;  Surgeon: Ileana Roup, MD;  Location: [facility];  Service: General;  Laterality: N/A;   HERNIA REPAIR     as a baby   ILEOSTOMY N/A 01/17/2018   Procedure: possible diverting LOOP ILEOSTOMY;  Surgeon: Ileana Roup, MD;  Location: [facility];  Service: General;  Laterality: N/A;   PORT-A-CATH REMOVAL Right 02/17/2018   Procedure: REMOVAL OF PORT-A-CATH;  Surgeon: Ileana Roup, MD;  Location[facility];  Service: General;  Laterality: Right;   PORTACATH PLACEMENT N/A 06/17/2017   Procedure: RIGHT VS LEFT INTERNAL JUGULAR PORT-A-CATH PLACEMENT WITH ULTRASOUND;  Surgeon: Ileana Roup, MD;  Location[facility];  Service: General;  Laterality: N/A;  FLURO   Patient Active Problem List   Diagnosis Date Noted   Insulin-requiring or dependent type II diabetes mellitus ([place])    Hyperlipidemia    Neuropathy    Genetic testing 08/27/2017   Family history of colon cancer    Family history of uterine cancer    Family history of stomach cancer    Family history of prostate cancer    Port-A-Cath in place 06/20/2017   Rectal cancer s/p robotic LAR rectosigmoid resection 01/17/2018 06/12/2017   Other and unspecified hyperlipidemia 06/19/2013    REFERRING DIAG: Left hip strain and impingement   THERAPY DIAG:  Pain in left hip  Cramp and spasm  Difficulty in walking, not elsewhere classified  Muscle weakness (generalized)  Rationale for Evaluation and Treatment Rehabilitation  PERTINENT HISTORY: recent remission from rectal cancer  PRECAUTIONS: none  SUBJECTIVE: Patient states he has not done any of his exercises because he hasn't received his strap yet.  He   PAIN:  Are you having pain? Yes: NPRS scale: 6/10 Pain location: left hip Pain description: aching Aggravating factors: standing and walking Relieving factors: meds, stretching   OBJECTIVE: (objective measures completed at initial evaluation unless otherwise dated)   OBJECTIVE:    DIAGNOSTIC FINDINGS: none   PATIENT SURVEYS:  FOTO 59, goal  is 84   COGNITION:           Overall cognitive status: Within functional limits for tasks assessed                          SENSATION: WFL     MUSCLE LENGTH: Hamstrings: Right 50 deg; Left 50 deg Thomas test: Right pos ; Left pos    POSTURE: rounded shoulders, forward head, and decreased lumbar lordosis   PALPATION: Tender to palpation per patient at just below iliac crest of left hip   LOWER EXTREMITY ROM:   WFL   LOWER EXTREMITY MMT:   Generally 4 to 4+/5   LOWER EXTREMITY SPECIAL TESTS:  Hip special tests: Saralyn Pilar (FABER) test: positive  and  Thomas test: positive    FUNCTIONAL TESTS:  5 times sit to stand: 9.66 sec Timed up and go (TUG): 6.44 sec   GAIT: Distance walked: 50 Assistive device utilized: None Level of assistance: Complete Independence Comments: antalgic gait, limp       TODAY'S TREATMENT:  07/21/21 NuStep x 5 min level 5 Reviewed HEP: completed all reps as follows Supine hamstring stretch 3 x 30 sec each LE  Supine lateral hip stretch , then knee straight for isolated IT band stretch 3 x 30 sec each both legs Hooklying pirirformis stretch 3 x 30 sec both (2 way: one with pushing on knee for hip IR's stretch, then heel to hip for isolated piriformis stretch) Added hip adductor stretch: butterfly then with strap 3 x 30 sec each, also suggested standing adductor stretch and added to HEP  4 way hip 2 x 10 0# left only Discussed appropriate surfaces to do exercises and how the body does not do well with stretching upon start up in the morning: educated on need to allow for good blood flow and for the muscles to warm up prior to stretching.   Patient declined ice due to had a work phone call meeting at 11:00.    TODAY'S TREATMENT:  07/19/21 Initial eval completed and initiated HEP     PATIENT EDUCATION:  Education details: Initiated HEP, educated on anatomy of the hip and s/s of hip pointer Person educated: Patient Education method: Consulting civil engineer, Demonstration, Verbal cues, and Handouts Education comprehension: verbalized understanding, returned demonstration, and verbal cues required     HOME EXERCISE PROGRAM: Access Code: [email or web address] URL: [email or web address] Date: 07/21/2021 Prepared by: Candyce Churn  Exercises - Supine Hamstring Stretch with Strap  - 1 x daily - 7 x weekly - 1 sets - 3 reps - 30 sec hold - Supine ITB Stretch with Strap  - 1 x daily - 7 x weekly - 1 sets - 3 reps - 30 sec hold - Supine Figure 4 Piriformis Stretch  - 1 x daily - 7 x weekly - 1 sets - 3 reps - 30 sec  hold - Supine Piriformis Stretch Pulling Heel to Hip  - 1 x daily - 7 x weekly - 1 sets - 3 reps - 30 sec hold - Supine Butterfly Groin Stretch  - 1 x daily - 7 x weekly - 1 sets - 3 reps - 30 sec hold - Side Lunge Adductor Stretch  - 1 x daily - 7 x weekly - 1 sets - 10 reps - 10 hold - Hip Abduction and Adduction Caregiver PROM  - 1 x daily - 7 x weekly - 1 sets - 3 reps - 30 sec hold - Small Range  Straight Leg Raise  - 1 x daily - 7 x weekly - 2 sets - 10 reps - Sidelying Hip Abduction  - 1 x daily - 7 x weekly - 2 sets - 10 reps - Prone Hip Extension  - 1 x daily - 7 x weekly - 2 sets - 10 reps - Sidelying Hip Adduction  - 1 x daily - 7 x weekly - 2 sets - 10 reps   ASSESSMENT:   CLINICAL IMPRESSION: Draden needed review of HEP but was able to complete all without increased pain.  He fatigued easily with 4 way hip exercises and needed to do sets of 10 vs 20 continuous reps.   He will likely be compliant with his HEP once he receives his stretch strap.  He would benefit from continued skilled PT for flexibility and hip stabilization exercises to reduce pain and allow him to resume his prior level of function and ability to exercise.       OBJECTIVE IMPAIRMENTS Abnormal gait, difficulty walking, decreased ROM, decreased strength, hypomobility, increased fascial restrictions, increased muscle spasms, impaired flexibility, and pain.    ACTIVITY LIMITATIONS carrying, lifting, bending, sitting, standing, squatting, sleeping, stairs, transfers, and dressing   PARTICIPATION LIMITATIONS: cleaning, laundry, driving, shopping, occupation, and yard work   PERSONAL FACTORS Fitness, Profession, and Time since onset of injury/illness/exacerbation are also affecting patient's functional outcome.    REHAB POTENTIAL: Good   CLINICAL DECISION MAKING: Stable/uncomplicated   EVALUATION COMPLEXITY: Low     GOALS: Goals reviewed with patient? Yes   SHORT TERM GOALS: Target date: 09/13/2021  Patient will  be independent with initial HEP  Baseline: Goal status: INITIAL   2.  Pain report to be no greater than 4/10  Baseline:  Goal status: INITIAL   3.  Normal heel to toe gait Baseline:  Goal status: INITIAL     LONG TERM GOALS: Target date: 09/13/2021    Patient to be independent with advanced HEP  Baseline:  Goal status: INITIAL   2.  Patient to report pain no greater than 2/10  Baseline:  Goal status: INITIAL   3.  Patient to be able to sleep through the night  Baseline:  Goal status: INITIAL   4.  FOTO to be 1 Baseline:  Goal status: INITIAL   5.  Patient to be able to don/doff shoes and socks and apply lotion to his feet without pain Baseline:  Goal status: INITIAL   6.  Patient to report 85% improvement in overall symptoms.  Baseline:  Goal status: INITIAL     PLAN: PT FREQUENCY: 1-2x/week   PT DURATION: 8 weeks   PLANNED INTERVENTIONS: Therapeutic exercises, Therapeutic activity, Neuromuscular re-education, Balance training, Gait training, Patient/Family education, Self Care, Joint mobilization, Stair training, Aquatic Therapy, Dry Needling, Electrical stimulation, Cryotherapy, Moist heat, Taping, Ultrasound, Ionotophoresis '4mg'$ /ml Dexamethasone, Manual therapy, and Re-evaluation   PLAN FOR NEXT SESSION: Review additions to HEP, Nustep, lunges, lateral band walks   Harlan B. Karon Cotterill, PT 07/21/21 11:09 AM  [facility] [address] [place], [place] [address] Phone # [phone] Fax [phone]

## 2021-07-26 ENCOUNTER — Ambulatory Visit: Payer: 59 | Admitting: Rehabilitative and Restorative Service Providers"

## 2021-07-26 ENCOUNTER — Encounter: Payer: Self-pay | Admitting: Rehabilitative and Restorative Service Providers"

## 2021-07-26 DIAGNOSIS — M6281 Muscle weakness (generalized): Secondary | ICD-10-CM

## 2021-07-26 DIAGNOSIS — M25552 Pain in left hip: Secondary | ICD-10-CM

## 2021-07-26 DIAGNOSIS — R252 Cramp and spasm: Secondary | ICD-10-CM

## 2021-07-26 DIAGNOSIS — R262 Difficulty in walking, not elsewhere classified: Secondary | ICD-10-CM

## 2021-07-26 DIAGNOSIS — S76012A Strain of muscle, fascia and tendon of left hip, initial encounter: Secondary | ICD-10-CM | POA: Diagnosis not present

## 2021-07-26 NOTE — Therapy (Signed)
OUTPATIENT PHYSICAL THERAPY TREATMENT NOTE   [name] MRN: [id number] DOB:[birth year], 56 y.o., male Today's Date: 07/26/2021  PCP: Lujean Amel, MD REFERRING PROVIDER: Gentry Fitz, MD   END OF SESSION:   PT End of Session - 07/26/21 0736     Visit Number 3    Date for PT Re-Evaluation 09/13/21    Authorization Type [employer]et[employer]    PT Start Time 0732    PT Stop Time 0800    PT Time Calculation (min) 28 min    Activity Tolerance Patient tolerated treatment well    Behavior During Therapy WFL for tasks assessed/performed             Past Medical History:  Diagnosis Date   Diabetes mellitus without complication ([place])    Type II   Family history of colon cancer    Family history of prostate cancer    Family history of stomach cancer    Family history of uterine cancer    History of chemotherapy LAST CHEMO NOV 2019   Hyperlipidemia    Neuropathy    FEET AND HANDS PAIN IN LEGS AT TIMES   Past Surgical History:  Procedure Laterality Date   ACHILLES TENDON REPAIR Right 2005   APPENDECTOMY     CYSTOSCOPY WITH STENT PLACEMENT Bilateral 01/17/2018   Procedure: CYSTOSCOPY WITH STENT PLACEMENT;  Surgeon: Kathie Rhodes, MD;  Location: [facility];  Service: Urology;  Laterality: Bilateral;   FLEXIBLE SIGMOIDOSCOPY N/A 01/17/2018   Procedure: FLEXIBLE SIGMOIDOSCOPY;  Surgeon: Ileana Roup, MD;  Location: [facility];  Service: General;  Laterality: N/A;   HERNIA REPAIR     as a baby   ILEOSTOMY N/A 01/17/2018   Procedure: possible diverting LOOP ILEOSTOMY;  Surgeon: Ileana Roup, MD;  Location: [facility];  Service: General;  Laterality: N/A;   PORT-A-CATH REMOVAL Right 02/17/2018   Procedure: REMOVAL OF PORT-A-CATH;  Surgeon: Ileana Roup, MD;  Location: [facility];  Service: General;  Laterality: Right;   PORTACATH PLACEMENT N/A 06/17/2017   Procedure: RIGHT VS LEFT INTERNAL JUGULAR PORT-A-CATH PLACEMENT WITH ULTRASOUND;  Surgeon: Ileana Roup, MD;  Location: [facility];  Service: General;  Laterality: N/A;  FLURO   Patient Active Problem List   Diagnosis Date Noted   Insulin-requiring or dependent type II diabetes mellitus ([place])    Hyperlipidemia    Neuropathy    Genetic testing 08/27/2017   Family history of colon cancer    Family history of uterine cancer    Family history of stomach cancer    Family history of prostate cancer    Port-A-Cath in place 06/20/2017   Rectal cancer s/p robotic LAR rectosigmoid resection 01/17/2018 06/12/2017   Other and unspecified hyperlipidemia 06/19/2013    REFERRING DIAG: Left hip strain and impingement   THERAPY DIAG:  Pain in left hip  Cramp and spasm  Difficulty in walking, not elsewhere classified  Muscle weakness (generalized)  Rationale for Evaluation and Treatment Rehabilitation  PERTINENT HISTORY: recent remission from rectal cancer  PRECAUTIONS: none  SUBJECTIVE: Patient states he has been doing his HEP and he is starting to feel better.  PAIN:  Are you having pain? Yes: NPRS scale: 5/10 Pain location: left hip Pain description: aching Aggravating factors: standing and walking Relieving factors: meds, stretching   OBJECTIVE: (objective measures completed at initial evaluation unless otherwise dated)   OBJECTIVE:    DIAGNOSTIC FINDINGS: none   PATIENT SURVEYS:  FOTO 3, goal is 45   COGNITION:  Overall cognitive status: Within functional limits for tasks assessed                          SENSATION: WFL     MUSCLE LENGTH: Hamstrings: Right 50 deg; Left 50 deg Thomas test: Right pos ; Left pos    POSTURE: rounded shoulders, forward head, and decreased lumbar lordosis   PALPATION: Tender to palpation per patient at just below iliac crest of left hip   LOWER EXTREMITY ROM:   WFL   LOWER EXTREMITY MMT:   Generally 4 to 4+/5   LOWER EXTREMITY SPECIAL TESTS:  Hip special tests: Saralyn Pilar (FABER) test: positive  and Thomas test: positive     FUNCTIONAL TESTS:  5 times sit to stand: 9.66 sec Timed up and go (TUG): 6.44 sec   GAIT: Distance walked: 50 Assistive device utilized: None Level of assistance: Complete Independence Comments: antalgic gait, limp       TODAY'S TREATMENT:    07/26/2021: Nustep level 5 x5 min with PT present to discuss status Standing hip flexor stretch at step 2x20 sec LLE Standing hamstring stretch at step 2x20 sec LLE Seated butterfly stretch 2x20 sec Supine LLE SLR 2x10 Sidelying LLE hip adduction and hip abduction 2x10 Prone LLE hip extension and HS curl 2x10 each Sit to/from stand with RLE outstretched to encourage increased weightbearing through LLE 2x10 Side stepping with green loop 3 x12 ft Fwd monster walk 4x12 ft Addaday to left hip in sidelying x2 min  07/21/21 NuStep x 5 min level 5 Reviewed HEP: completed all reps as follows Supine hamstring stretch 3 x 30 sec each LE  Supine lateral hip stretch , then knee straight for isolated IT band stretch 3 x 30 sec each both legs Hooklying pirirformis stretch 3 x 30 sec both (2 way: one with pushing on knee for hip IR's stretch, then heel to hip for isolated piriformis stretch) Added hip adductor stretch: butterfly then with strap 3 x 30 sec each, also suggested standing adductor stretch and added to HEP  4 way hip 2 x 10 0# left only Discussed appropriate surfaces to do exercises and how the body does not do well with stretching upon start up in the morning: educated on need to allow for good blood flow and for the muscles to warm up prior to stretching.   Patient declined ice due to had a work phone call meeting at 11:00.    07/19/21 Initial eval completed and initiated HEP     PATIENT EDUCATION:  Education details: Initiated HEP, educated on anatomy of the hip and s/s of hip pointer Person educated: Patient Education method: Consulting civil engineer, Demonstration, Verbal cues, and Handouts Education comprehension: verbalized understanding,  returned demonstration, and verbal cues required     HOME EXERCISE PROGRAM: Access Code: [email or web address] URL: [email or web address] Date: 07/21/2021 Prepared by: Candyce Churn  Exercises - Supine Hamstring Stretch with Strap  - 1 x daily - 7 x weekly - 1 sets - 3 reps - 30 sec hold - Supine ITB Stretch with Strap  - 1 x daily - 7 x weekly - 1 sets - 3 reps - 30 sec hold - Supine Figure 4 Piriformis Stretch  - 1 x daily - 7 x weekly - 1 sets - 3 reps - 30 sec hold - Supine Piriformis Stretch Pulling Heel to Hip  - 1 x daily - 7 x weekly - 1 sets - 3 reps - 30 sec hold - Supine  Butterfly Groin Stretch  - 1 x daily - 7 x weekly - 1 sets - 3 reps - 30 sec hold - Side Lunge Adductor Stretch  - 1 x daily - 7 x weekly - 1 sets - 10 reps - 10 hold - Hip Abduction and Adduction Caregiver PROM  - 1 x daily - 7 x weekly - 1 sets - 3 reps - 30 sec hold - Small Range Straight Leg Raise  - 1 x daily - 7 x weekly - 2 sets - 10 reps - Sidelying Hip Abduction  - 1 x daily - 7 x weekly - 2 sets - 10 reps - Prone Hip Extension  - 1 x daily - 7 x weekly - 2 sets - 10 reps - Sidelying Hip Adduction  - 1 x daily - 7 x weekly - 2 sets - 10 reps   ASSESSMENT:   CLINICAL IMPRESSION: Charles [name] presents to skilled rehabilitation reporting some soreness, but overall that he is feeling better.  Pt able to progress through exercises with some muscle fatigue reported.  He noted increased muscle soreness following forward monster walking with green loop.  Proceeded to perform addaday to left hip and IT band in sidelying and pt reports relief of soreness following.  Pt states that he has a similar machine to Addaday at home and he will start using.  Pt continues to require skilled PT to progress towards goal related activities.     OBJECTIVE IMPAIRMENTS Abnormal gait, difficulty walking, decreased ROM, decreased strength, hypomobility, increased fascial restrictions, increased muscle spasms, impaired flexibility,  and pain.    ACTIVITY LIMITATIONS carrying, lifting, bending, sitting, standing, squatting, sleeping, stairs, transfers, and dressing   PARTICIPATION LIMITATIONS: cleaning, laundry, driving, shopping, occupation, and yard work   PERSONAL FACTORS Fitness, Profession, and Time since onset of injury/illness/exacerbation are also affecting patient's functional outcome.    REHAB POTENTIAL: Good   CLINICAL DECISION MAKING: Stable/uncomplicated   EVALUATION COMPLEXITY: Low     GOALS: Goals reviewed with patient? Yes   SHORT TERM GOALS: Target date: 09/13/2021  Patient will be independent with initial HEP  Baseline: Goal status: Ongoing   2.  Pain report to be no greater than 4/10  Baseline:  Goal status: INITIAL   3.  Normal heel to toe gait Baseline:  Goal status: INITIAL     LONG TERM GOALS: Target date: 09/13/2021    Patient to be independent with advanced HEP  Baseline:  Goal status: INITIAL   2.  Patient to report pain no greater than 2/10  Baseline:  Goal status: INITIAL   3.  Patient to be able to sleep through the night  Baseline:  Goal status: INITIAL   4.  FOTO to be 37 Baseline:  Goal status: INITIAL   5.  Patient to be able to don/doff shoes and socks and apply lotion to his feet without pain Baseline:  Goal status: INITIAL   6.  Patient to report 85% improvement in overall symptoms.  Baseline:  Goal status: INITIAL     PLAN: PT FREQUENCY: 1-2x/week   PT DURATION: 8 weeks   PLANNED INTERVENTIONS: Therapeutic exercises, Therapeutic activity, Neuromuscular re-education, Balance training, Gait training, Patient/Family education, Self Care, Joint mobilization, Stair training, Aquatic Therapy, Dry Needling, Electrical stimulation, Cryotherapy, Moist heat, Taping, Ultrasound, Ionotophoresis '4mg'$ /ml Dexamethasone, Manual therapy, and Re-evaluation   PLAN FOR NEXT SESSION: Review additions to HEP, Nustep, lunges, lateral band walks   [place],  PT 07/26/21 8:34 AM   [facility] [address], [place], [place] [address] Phone # [phone] Fax [phone]

## 2021-07-27 ENCOUNTER — Ambulatory Visit: Payer: 59

## 2021-07-27 DIAGNOSIS — M25552 Pain in left hip: Secondary | ICD-10-CM

## 2021-07-27 DIAGNOSIS — M6281 Muscle weakness (generalized): Secondary | ICD-10-CM

## 2021-07-27 DIAGNOSIS — R252 Cramp and spasm: Secondary | ICD-10-CM

## 2021-07-27 DIAGNOSIS — R262 Difficulty in walking, not elsewhere classified: Secondary | ICD-10-CM

## 2021-07-27 DIAGNOSIS — S76012A Strain of muscle, fascia and tendon of left hip, initial encounter: Secondary | ICD-10-CM | POA: Diagnosis not present

## 2021-07-27 NOTE — Therapy (Signed)
OUTPATIENT PHYSICAL THERAPY TREATMENT NOTE   [name] MRN: [id number] DOB:1965/07/08, 56 y.o., male Today's Date: 07/27/2021  PCP: Lujean Amel, MD REFERRING PROVIDER: Gentry Fitz, MD   END OF SESSION:   PT End of Session - 07/27/21 1232     Visit Number 4    Date for PT Re-Evaluation 09/13/21    Authorization Type [employer]et[employer]    PT Start Time 1146    PT Stop Time 1228    PT Time Calculation (min) 42 min    Activity Tolerance Patient tolerated treatment well    Behavior During Therapy WFL for tasks assessed/performed              Past Medical History:  Diagnosis Date   Diabetes mellitus without complication ([place])    Type II   Family history of colon cancer    Family history of prostate cancer    Family history of stomach cancer    Family history of uterine cancer    History of chemotherapy LAST CHEMO NOV 2019   Hyperlipidemia    Neuropathy    FEET AND HANDS PAIN IN LEGS AT TIMES   Past Surgical History:  Procedure Laterality Date   ACHILLES TENDON REPAIR Right 2005   APPENDECTOMY     CYSTOSCOPY WITH STENT PLACEMENT Bilateral 01/17/2018   Procedure: CYSTOSCOPY WITH STENT PLACEMENT;  Surgeon: Kathie Rhodes, MD;  Location: [facility];  Service: Urology;  Laterality: Bilateral;   FLEXIBLE SIGMOIDOSCOPY N/A 01/17/2018   Procedure: FLEXIBLE SIGMOIDOSCOPY;  Surgeon: Ileana Roup, MD;  Location: [facility];  Service: General;  Laterality: N/A;   HERNIA REPAIR     as a baby   ILEOSTOMY N/A 01/17/2018   Procedure: possible diverting LOOP ILEOSTOMY;  Surgeon: Ileana Roup, MD;  Location: [facility];  Service: General;  Laterality: N/A;   PORT-A-CATH REMOVAL Right 02/17/2018   Procedure: REMOVAL OF PORT-A-CATH;  Surgeon: Ileana Roup, MD;  Location: [facility];  Service: General;  Laterality: Right;   PORTACATH PLACEMENT N/A 06/17/2017   Procedure: RIGHT VS LEFT INTERNAL JUGULAR PORT-A-CATH PLACEMENT WITH ULTRASOUND;  Surgeon: Ileana Roup, MD;  Location[facility];  Service: General;  Laterality: N/A;  FLURO   Patient Active Problem List   Diagnosis Date Noted   Insulin-requiring or dependent type II diabetes mellitus ([place])    Hyperlipidemia    Neuropathy    Genetic testing 08/27/2017   Family history of colon cancer    Family history of uterine cancer    Family history of stomach cancer    Family history of prostate cancer    Port-A-Cath in place 06/20/2017   Rectal cancer s/p robotic LAR rectosigmoid resection 01/17/2018 06/12/2017   Other and unspecified hyperlipidemia 06/19/2013    REFERRING DIAG: Left hip strain and impingement   THERAPY DIAG:  Pain in left hip  Cramp and spasm  Difficulty in walking, not elsewhere classified  Muscle weakness (generalized)  Rationale for Evaluation and Treatment Rehabilitation  PERTINENT HISTORY: recent remission from rectal cancer  PRECAUTIONS: none  SUBJECTIVE: I walked a mile yesterday after I was here so I am tired.    PAIN:  Are you having pain? Yes: NPRS scale: 5/10 Pain location: left hip Pain description: aching Aggravating factors: standing and walking Relieving factors: meds, stretching   OBJECTIVE: (objective measures completed at initial evaluation unless otherwise dated)   OBJECTIVE:    DIAGNOSTIC FINDINGS: none   PATIENT SURVEYS:  FOTO 59, goal is 30  COGNITION:           Overall cognitive status: Within functional limits for tasks assessed                          SENSATION: WFL     MUSCLE LENGTH: Hamstrings: Right 50 deg; Left 50 deg Thomas test: Right pos ; Left pos    POSTURE: rounded shoulders, forward head, and decreased lumbar lordosis   PALPATION: Tender to palpation per patient at just below iliac crest of left hip   LOWER EXTREMITY ROM:   WFL   LOWER EXTREMITY MMT:   Generally 4 to 4+/5   LOWER EXTREMITY SPECIAL TESTS:  Hip special tests: Saralyn Pilar (FABER) test: positive  and Thomas test: positive    FUNCTIONAL  TESTS:  5 times sit to stand: 9.66 sec Timed up and go (TUG): 6.44 sec   GAIT: Distance walked: 50 Assistive device utilized: None Level of assistance: Complete Independence Comments: antalgic gait, limp     TODAY'S TREATMENT:   07/27/2021: Nustep level 5 x5 min with PT present to discuss status Standing hip flexor stretch at step 2x20 sec LLE Standing hamstring stretch at step 2x20 sec LLE Standing hip abduction and extension x10 bil each Supine Lt LE SLR 2x10 Sidelying LLE hip adduction hip abduction 2x10 Prone LLE hip extension and HS curl 2x10 each Sit to/from stand with RLE outstretched to encourage increased weightbearing through LLE 2x10 Side stepping with green loop 3 x12 ft Fwd monster walk 4x12 ft Addaday to left hip in sidelying x8 min  07/26/2021: Nustep level 5 x5 min with PT present to discuss status Standing hip flexor stretch at step 2x20 sec LLE Standing hamstring stretch at step 2x20 sec LLE Seated butterfly stretch 2x20 sec Supine LLE SLR 2x10 Sidelying LLE hip adduction and hip abduction 2x10 Prone LLE hip extension and HS curl 2x10 each Sit to/from stand with RLE outstretched to encourage increased weightbearing through LLE 2x10 Side stepping with green loop 3 x12 ft Fwd monster walk 4x12 ft Addaday to left hip in sidelying x2 min  07/21/21 NuStep x 5 min level 5 Reviewed HEP: completed all reps as follows Supine hamstring stretch 3 x 30 sec each LE  Supine lateral hip stretch , then knee straight for isolated IT band stretch 3 x 30 sec each both legs Hooklying pirirformis stretch 3 x 30 sec both (2 way: one with pushing on knee for hip IR's stretch, then heel to hip for isolated piriformis stretch) Added hip adductor stretch: butterfly then with strap 3 x 30 sec each, also suggested standing adductor stretch and added to HEP  4 way hip 2 x 10 0# left only Discussed appropriate surfaces to do exercises and how the body does not do well with stretching  upon start up in the morning: educated on need to allow for good blood flow and for the muscles to warm up prior to stretching.   Patient declined ice due to had a work phone call meeting at 11:00.        PATIENT EDUCATION:  Education details: Initiated HEP, educated on anatomy of the hip and s/s of hip pointer Person educated: Patient Education method: Consulting civil engineer, Media planner, Verbal cues, and Handouts Education comprehension: verbalized understanding, returned demonstration, and verbal cues required     HOME EXERCISE PROGRAM: Access Code: [email or web address] URL: [email or web address] Date: 07/21/2021 Prepared by: Candyce Churn  Exercises - Supine Hamstring Stretch with Strap  - 1 x  daily - 7 x weekly - 1 sets - 3 reps - 30 sec hold - Supine ITB Stretch with Strap  - 1 x daily - 7 x weekly - 1 sets - 3 reps - 30 sec hold - Supine Figure 4 Piriformis Stretch  - 1 x daily - 7 x weekly - 1 sets - 3 reps - 30 sec hold - Supine Piriformis Stretch Pulling Heel to Hip  - 1 x daily - 7 x weekly - 1 sets - 3 reps - 30 sec hold - Supine Butterfly Groin Stretch  - 1 x daily - 7 x weekly - 1 sets - 3 reps - 30 sec hold - Side Lunge Adductor Stretch  - 1 x daily - 7 x weekly - 1 sets - 10 reps - 10 hold - Hip Abduction and Adduction Caregiver PROM  - 1 x daily - 7 x weekly - 1 sets - 3 reps - 30 sec hold - Small Range Straight Leg Raise  - 1 x daily - 7 x weekly - 2 sets - 10 reps - Sidelying Hip Abduction  - 1 x daily - 7 x weekly - 2 sets - 10 reps - Prone Hip Extension  - 1 x daily - 7 x weekly - 2 sets - 10 reps - Sidelying Hip Adduction  - 1 x daily - 7 x weekly - 2 sets - 10 reps   ASSESSMENT:   CLINICAL IMPRESSION: Pt was here yesterday so not much change since then.  He walked a mile for exercise yesterday after treatment so he was fatigued.  Pt able to progress through exercises with some muscle fatigue reported.  Pt does have some muscle soreness with monster walks and  sidestepping with loop.   Proceeded to perform addaday to left hip and IT band in sidelying and pt reports relief of soreness following.  Pt has good response to addaday at the end of the session.  Pt continues to require skilled PT to progress towards goal related activities.     OBJECTIVE IMPAIRMENTS Abnormal gait, difficulty walking, decreased ROM, decreased strength, hypomobility, increased fascial restrictions, increased muscle spasms, impaired flexibility, and pain.    ACTIVITY LIMITATIONS carrying, lifting, bending, sitting, standing, squatting, sleeping, stairs, transfers, and dressing   PARTICIPATION LIMITATIONS: cleaning, laundry, driving, shopping, occupation, and yard work   PERSONAL FACTORS Fitness, Profession, and Time since onset of injury/illness/exacerbation are also affecting patient's functional outcome.    REHAB POTENTIAL: Good   CLINICAL DECISION MAKING: Stable/uncomplicated   EVALUATION COMPLEXITY: Low     GOALS: Goals reviewed with patient? Yes   SHORT TERM GOALS: Target date: 09/13/2021  Patient will be independent with initial HEP  Baseline: Goal status: Ongoing   2.  Pain report to be no greater than 4/10  Baseline:  Goal status: INITIAL   3.  Normal heel to toe gait Baseline:  Goal status: INITIAL     LONG TERM GOALS: Target date: 09/13/2021    Patient to be independent with advanced HEP  Baseline:  Goal status: INITIAL   2.  Patient to report pain no greater than 2/10  Baseline:  Goal status: INITIAL   3.  Patient to be able to sleep through the night  Baseline:  Goal status: INITIAL   4.  FOTO to be 68 Baseline:  Goal status: INITIAL   5.  Patient to be able to don/doff shoes and socks and apply lotion to his feet without pain Baseline:  Goal status:  INITIAL   6.  Patient to report 85% improvement in overall symptoms.  Baseline:  Goal status: INITIAL     PLAN: PT FREQUENCY: 1-2x/week   PT DURATION: 8 weeks   PLANNED  INTERVENTIONS: Therapeutic exercises, Therapeutic activity, Neuromuscular re-education, Balance training, Gait training, Patient/Family education, Self Care, Joint mobilization, Stair training, Aquatic Therapy, Dry Needling, Electrical stimulation, Cryotherapy, Moist heat, Taping, Ultrasound, Ionotophoresis '4mg'$ /ml Dexamethasone, Manual therapy, and Re-evaluation   PLAN FOR NEXT SESSION:  Nustep, lunges, lateral band walks, LE strength and endurance.  Addaday at end of session for pain management.     Sigurd Sos, PT 07/27/21 12:33 PM   [facility] [address], [place], [place] [address] Phone # [phone] Fax [phone]

## 2021-08-01 ENCOUNTER — Ambulatory Visit: Payer: 59

## 2021-08-01 DIAGNOSIS — M25552 Pain in left hip: Secondary | ICD-10-CM

## 2021-08-01 DIAGNOSIS — S76012A Strain of muscle, fascia and tendon of left hip, initial encounter: Secondary | ICD-10-CM | POA: Diagnosis not present

## 2021-08-01 DIAGNOSIS — R262 Difficulty in walking, not elsewhere classified: Secondary | ICD-10-CM

## 2021-08-01 DIAGNOSIS — M6281 Muscle weakness (generalized): Secondary | ICD-10-CM

## 2021-08-01 DIAGNOSIS — R252 Cramp and spasm: Secondary | ICD-10-CM

## 2021-08-01 NOTE — Therapy (Signed)
OUTPATIENT PHYSICAL THERAPY TREATMENT NOTE   [name] MRN: [id number] DOB:[birth year], 56 y.o., male Today's Date: 08/01/2021  PCP: Lujean Amel, MD REFERRING PROVIDER: Gentry Fitz, MD   END OF SESSION:   PT End of Session - 08/01/21 0928     Visit Number 5    Date for PT Re-Evaluation 09/13/21    Authorization Type [employer]    PT Start Time [phone]    PT Stop Time 0928    PT Time Calculation (min) 41 min    Activity Tolerance Patient tolerated treatment well    Behavior During Therapy WFL for tasks assessed/performed               Past Medical History:  Diagnosis Date   Diabetes mellitus without complication ([place])    Type II   Family history of colon cancer    Family history of prostate cancer    Family history of stomach cancer    Family history of uterine cancer    History of chemotherapy LAST CHEMO NOV 2019   Hyperlipidemia    Neuropathy    FEET AND HANDS PAIN IN LEGS AT TIMES   Past Surgical History:  Procedure Laterality Date   ACHILLES TENDON REPAIR Right 2005   APPENDECTOMY     CYSTOSCOPY WITH STENT PLACEMENT Bilateral 01/17/2018   Procedure: CYSTOSCOPY WITH STENT PLACEMENT;  Surgeon: Kathie Rhodes, MD;  Location: [facility];  Service: Urology;  Laterality: Bilateral;   FLEXIBLE SIGMOIDOSCOPY N/A 01/17/2018   Procedure: FLEXIBLE SIGMOIDOSCOPY;  Surgeon: Ileana Roup, MD;  Location: [facility];  Service: General;  Laterality: N/A;   HERNIA REPAIR     as a baby   ILEOSTOMY N/A 01/17/2018   Procedure: possible diverting LOOP ILEOSTOMY;  Surgeon: Ileana Roup, MD;  Location: [facility];  Service: General;  Laterality: N/A;   PORT-A-CATH REMOVAL Right 02/17/2018   Procedure: REMOVAL OF PORT-A-CATH;  Surgeon: Ileana Roup, MD;  Location: [facility];  Service: General;  Laterality: Right;   PORTACATH PLACEMENT N/A 06/17/2017   Procedure: RIGHT VS LEFT INTERNAL JUGULAR PORT-A-CATH PLACEMENT WITH ULTRASOUND;  Surgeon: Ileana Roup, MD;  Location: [facility];  Service: General;  Laterality: N/A;  FLURO   Patient Active Problem List   Diagnosis Date Noted   Insulin-requiring or dependent type II diabetes mellitus ([place])    Hyperlipidemia    Neuropathy    Genetic testing 08/27/2017   Family history of colon cancer    Family history of uterine cancer    Family history of stomach cancer    Family history of prostate cancer    Port-A-Cath in place 06/20/2017   Rectal cancer s/p robotic LAR rectosigmoid resection 01/17/2018 06/12/2017   Other and unspecified hyperlipidemia 06/19/2013    REFERRING DIAG: Left hip strain and impingement   THERAPY DIAG:  Pain in left hip  Cramp and spasm  Difficulty in walking, not elsewhere classified  Muscle weakness (generalized)  Rationale for Evaluation and Treatment Rehabilitation  PERTINENT HISTORY: recent remission from rectal cancer  PRECAUTIONS: none  SUBJECTIVE: I am definitely better.  I feel 50% better.      PAIN:  Are you having pain? Yes: NPRS scale: 2-3/10 Pain location: left hip Pain description: aching Aggravating factors: standing and walking Relieving factors: meds, stretching   OBJECTIVE: (objective measures completed at initial evaluation unless otherwise dated)   OBJECTIVE:    DIAGNOSTIC FINDINGS: none   PATIENT SURVEYS:  FOTO 50, goal is 30   COGNITION:  Overall cognitive status: Within functional limits for tasks assessed                           MUSCLE LENGTH: Hamstrings: Right 50 deg; Left 50 deg Thomas test: Right pos ; Left pos    POSTURE: rounded shoulders, forward head, and decreased lumbar lordosis   PALPATION: Tender to palpation per patient at just below iliac crest of left hip   LOWER EXTREMITY ROM:   WFL   LOWER EXTREMITY MMT:   Generally 4 to 4+/5   LOWER EXTREMITY SPECIAL TESTS:  Hip special tests: Saralyn Pilar (FABER) test: positive  and Thomas test: positive    FUNCTIONAL TESTS:  5 times sit  to stand: 9.66 sec Timed up and go (TUG): 6.44 sec   GAIT: Distance walked: 50 Assistive device utilized: None Level of assistance: Complete Independence Comments: antalgic gait, limp     TODAY'S TREATMENT:   08/01/2021: Nustep level 5 x 6 min with PT present to discuss status Seated figure 4 3x20 seconds Seated hamstring stretch at step 2x20 sec LLE Standing hip abduction and extension x10 bil each Sit to/from stand with RLE outstretched to encourage increased weightbearing through LLE 2x10 added 5# kettlebell Side stepping with green loop 3 x12 ft Trigger Point Dry-Needling  Treatment instructions: Expect mild to moderate muscle soreness. S/S of pneumothorax if dry needled over a lung field, and to seek immediate medical attention should they occur. Patient verbalized understanding of these instructions and education.  Patient Consent Given: Yes Education handout provided: Previously provided Muscles treated: Lt gluteals  Treatment response/outcome: Utilized skilled palpation to identify trigger points.  During dry needling able to palpate muscle twitch and muscle elongation  Skilled palpation and monitoring by PT during dry needling  Manual: Addaday to left hip in sidelying after needling   07/27/2021: Nustep level 5 x 6 min with PT present to discuss status Standing hip flexor stretch at step 2x20 sec LLE Standing hamstring stretch at step 2x20 sec LLE Standing hip abduction and extension x10 bil each Supine Lt LE SLR 2x10 Sidelying LLE hip adduction hip abduction 2x10 Prone LLE hip extension and HS curl 2x10 each Sit to/from stand with RLE outstretched to encourage increased weightbearing through LLE 2x10 Side stepping with green loop 3 x12 ft Fwd monster walk 4x12 ft Addaday to left hip in sidelying x8 min  07/26/2021: Nustep level 5 x5 min with PT present to discuss status Standing hip flexor stretch at step 2x20 sec LLE Standing hamstring stretch at step 2x20 sec  LLE Seated butterfly stretch 2x20 sec Supine LLE SLR 2x10 Sidelying LLE hip adduction and hip abduction 2x10 Prone LLE hip extension and HS curl 2x10 each Sit to/from stand with RLE outstretched to encourage increased weightbearing through LLE 2x10 Side stepping with green loop 3 x12 ft Fwd monster walk 4x12 ft Addaday to left hip in sidelying x2 min    PATIENT EDUCATION:  Education details: Initiated HEP, educated on anatomy of the hip and s/s of hip pointer Person educated: Patient Education method: Consulting civil engineer, Media planner, Verbal cues, and Handouts Education comprehension: verbalized understanding, returned demonstration, and verbal cues required     HOME EXERCISE PROGRAM: Access Code: [email or web address] URL: [email or web address] Date: 07/21/2021 Prepared by: Candyce Churn  Exercises - Supine Hamstring Stretch with Strap  - 1 x daily - 7 x weekly - 1 sets - 3 reps - 30 sec hold - Supine ITB Stretch with Strap  - 1  x daily - 7 x weekly - 1 sets - 3 reps - 30 sec hold - Supine Figure 4 Piriformis Stretch  - 1 x daily - 7 x weekly - 1 sets - 3 reps - 30 sec hold - Supine Piriformis Stretch Pulling Heel to Hip  - 1 x daily - 7 x weekly - 1 sets - 3 reps - 30 sec hold - Supine Butterfly Groin Stretch  - 1 x daily - 7 x weekly - 1 sets - 3 reps - 30 sec hold - Side Lunge Adductor Stretch  - 1 x daily - 7 x weekly - 1 sets - 10 reps - 10 hold - Hip Abduction and Adduction Caregiver PROM  - 1 x daily - 7 x weekly - 1 sets - 3 reps - 30 sec hold - Small Range Straight Leg Raise  - 1 x daily - 7 x weekly - 2 sets - 10 reps - Sidelying Hip Abduction  - 1 x daily - 7 x weekly - 2 sets - 10 reps - Prone Hip Extension  - 1 x daily - 7 x weekly - 2 sets - 10 reps - Sidelying Hip Adduction  - 1 x daily - 7 x weekly - 2 sets - 10 reps   ASSESSMENT:   CLINICAL IMPRESSION: Pt reports 50% overall reduction in Lt gluteal pain since the start of care. Pt is independent and compliant in  HEP for flexibility and strength and reports a max of 3-4/10 pain with daily tasks and exercise.  Pt with good response to DN with twitch response and improved tissue mobility/reduced pain after and pt had good response to addaday at the end of the session.  Pt continues to require skilled PT to progress towards goal related activities.     OBJECTIVE IMPAIRMENTS Abnormal gait, difficulty walking, decreased ROM, decreased strength, hypomobility, increased fascial restrictions, increased muscle spasms, impaired flexibility, and pain.    ACTIVITY LIMITATIONS carrying, lifting, bending, sitting, standing, squatting, sleeping, stairs, transfers, and dressing   PARTICIPATION LIMITATIONS: cleaning, laundry, driving, shopping, occupation, and yard work   PERSONAL FACTORS Fitness, Profession, and Time since onset of injury/illness/exacerbation are also affecting patient's functional outcome.    REHAB POTENTIAL: Good   CLINICAL DECISION MAKING: Stable/uncomplicated   EVALUATION COMPLEXITY: Low     GOALS: Goals reviewed with patient? Yes   SHORT TERM GOALS: Target date: 09/13/2021  Patient will be independent with initial HEP  Baseline: Goal status: MET  2.  Pain report to be no greater than 4/10  Baseline: 3-4/10 max (08/01/21) Goal status: MET   3.  Normal heel to toe gait Baseline:  Goal status: INITIAL     LONG TERM GOALS: Target date: 09/13/2021    Patient to be independent with advanced HEP  Baseline:  Goal status: INITIAL   2.  Patient to report pain no greater than 2/10  Baseline: 3-4/10 (08/01/21) Goal status: In progress    3.  Patient to be able to sleep through the night  Baseline:  Goal status: INITIAL   4.  FOTO to be 11 Baseline:  Goal status: INITIAL   5.  Patient to be able to don/doff shoes and socks and apply lotion to his feet without pain Baseline:  Goal status: INITIAL   6.  Patient to report 85% improvement in overall symptoms.  Baseline: 50% reported  (08/01/21) Goal status: INITIAL     PLAN: PT FREQUENCY: 1-2x/week   PT DURATION: 8 weeks  PLANNED INTERVENTIONS: Therapeutic exercises, Therapeutic activity, Neuromuscular re-education, Balance training, Gait training, Patient/Family education, Self Care, Joint mobilization, Stair training, Aquatic Therapy, Dry Needling, Electrical stimulation, Cryotherapy, Moist heat, Taping, Ultrasound, Ionotophoresis 45m/ml Dexamethasone, Manual therapy, and Re-evaluation   PLAN FOR NEXT SESSION:  See how pt responds to DN, Gluteal strength, flexibility and manual to address pain.   KSigurd Sos PT 08/01/21 9:29 AM   [facility] [address] [place]utcher[place]ano [place] [address]Phone # [phone] [phone]

## 2021-08-03 ENCOUNTER — Ambulatory Visit: Payer: 59

## 2021-08-03 DIAGNOSIS — R252 Cramp and spasm: Secondary | ICD-10-CM

## 2021-08-03 DIAGNOSIS — R262 Difficulty in walking, not elsewhere classified: Secondary | ICD-10-CM

## 2021-08-03 DIAGNOSIS — M6281 Muscle weakness (generalized): Secondary | ICD-10-CM

## 2021-08-03 DIAGNOSIS — M25552 Pain in left hip: Secondary | ICD-10-CM

## 2021-08-03 DIAGNOSIS — S76012A Strain of muscle, fascia and tendon of left hip, initial encounter: Secondary | ICD-10-CM | POA: Diagnosis not present

## 2021-08-03 NOTE — Therapy (Signed)
OUTPATIENT PHYSICAL THERAPY TREATMENT NOTE   [name] MRN: [id number] DOB:1965-08-13, 56 y.o., male Today's Date: 08/03/2021  PCP: Lujean Amel, MD REFERRING PROVIDER: Gentry Fitz, MD   END OF SESSION:   PT End of Session - 08/03/21 0847     Visit Number 6    Date for PT Re-Evaluation 09/13/21    Authorization Type [employer]    PT Start Time [phone]    PT Stop Time 0925    PT Time Calculation (min) 38 min    Activity Tolerance Patient tolerated treatment well    Behavior During Therapy WFL for tasks assessed/performed               Past Medical History:  Diagnosis Date   Diabetes mellitus without complication ([place])    Type II   Family history of colon cancer    Family history of prostate cancer    Family history of stomach cancer    Family history of uterine cancer    History of chemotherapy LAST CHEMO NOV 2019   Hyperlipidemia    Neuropathy    FEET AND HANDS PAIN IN LEGS AT TIMES   Past Surgical History:  Procedure Laterality Date   ACHILLES TENDON REPAIR Right 2005   APPENDECTOMY     CYSTOSCOPY WITH STENT PLACEMENT Bilateral 01/17/2018   Procedure: CYSTOSCOPY WITH STENT PLACEMENT;  Surgeon: Kathie Rhodes, MD;  Location: [facility];  Service: Urology;  Laterality: Bilateral;   FLEXIBLE SIGMOIDOSCOPY N/A 01/17/2018   Procedure: FLEXIBLE SIGMOIDOSCOPY;  Surgeon: Ileana Roup, MD;  Location: [facility];  Service: General;  Laterality: N/A;   HERNIA REPAIR     as a baby   ILEOSTOMY N/A 01/17/2018   Procedure: possible diverting LOOP ILEOSTOMY;  Surgeon: Ileana Roup, MD;  Location: [facility];  Service: General;  Laterality: N/A;   PORT-A-CATH REMOVAL Right 02/17/2018   Procedure: REMOVAL OF PORT-A-CATH;  Surgeon: Ileana Roup, MD;  Location: [facility];  Service: General;  Laterality: Right;   PORTACATH PLACEMENT N/A 06/17/2017   Procedure: RIGHT VS LEFT INTERNAL JUGULAR PORT-A-CATH PLACEMENT WITH ULTRASOUND;  Surgeon: Ileana Roup, MD;  Location: [facility];  Service: General;  Laterality: N/A;  FLURO   Patient Active Problem List   Diagnosis Date Noted   Insulin-requiring or dependent type II diabetes mellitus ([place])    Hyperlipidemia    Neuropathy    Genetic testing 08/27/2017   Family history of colon cancer    Family history of uterine cancer    Family history of stomach cancer    Family history of prostate cancer    Port-A-Cath in place 06/20/2017   Rectal cancer s/p robotic LAR rectosigmoid resection 01/17/2018 06/12/2017   Other and unspecified hyperlipidemia 06/19/2013    REFERRING DIAG: Left hip strain and impingement   THERAPY DIAG:  Pain in left hip  Cramp and spasm  Difficulty in walking, not elsewhere classified  Muscle weakness (generalized)  Rationale for Evaluation and Treatment Rehabilitation  PERTINENT HISTORY: recent remission from rectal cancer  PRECAUTIONS: none  SUBJECTIVE: I am definitely better.  I feel 60% better.   I think the massage gun helps more than the dry needling.    PAIN:  Are you having pain? Yes: NPRS scale: 3/10 Pain location: left hip Pain description: aching Aggravating factors: standing and walking Relieving factors: meds, stretching   OBJECTIVE: (objective measures completed at initial evaluation unless otherwise dated)   OBJECTIVE:    DIAGNOSTIC FINDINGS: none  PATIENT SURVEYS:  FOTO 59, goal is 64   COGNITION:           Overall cognitive status: Within functional limits for tasks assessed                           MUSCLE LENGTH: Hamstrings: Right 50 deg; Left 50 deg Thomas test: Right pos ; Left pos    POSTURE: rounded shoulders, forward head, and decreased lumbar lordosis   PALPATION: Tender to palpation per patient at just below iliac crest of left hip   LOWER EXTREMITY ROM:   WFL   LOWER EXTREMITY MMT:   Generally 4 to 4+/5   LOWER EXTREMITY SPECIAL TESTS:  Hip special tests: Saralyn Pilar (FABER) test: positive  and  Thomas test: positive    FUNCTIONAL TESTS:  5 times sit to stand: 9.66 sec Timed up and go (TUG): 6.44 sec   GAIT: Distance walked: 50 Assistive device utilized: None Level of assistance: Complete Independence Comments: antalgic gait, limp     TODAY'S TREATMENT:   08/03/2021: Nustep level 5 x 5 min with PT present to discuss status Seated figure 4 3x20 seconds Sit to/from stand with RLE outstretched to encourage increased weightbearing through LLE 2x10 added 5# kettlebell Standing hamstring stretch at step 3x20 sec LLE (verbal cues for correct technique- patient was rounding back) Standing hip abduction and extension 2 x10 bil each Side stepping with blue loop 3 x 10 steps Cone touches: (cone on crate) 3 x 10 1 D ball toss in single leg stance at trampoline 3 x 20 red plyo ball Right side lying addaday to left hip just distal to iliac crest x 5 min Right side lying hawks grip for soft tissue mobilization x 3 min just distal to iliac crest.    08/01/2021: Nustep level 5 x 6 min with PT present to discuss status Seated figure 4 3x20 seconds Seated hamstring stretch at step 2x20 sec LLE Standing hip abduction and extension x10 bil each Sit to/from stand with RLE outstretched to encourage increased weightbearing through LLE 2x10 added 5# kettlebell Side stepping with green loop 3 x12 ft Trigger Point Dry-Needling  Treatment instructions: Expect mild to moderate muscle soreness. S/S of pneumothorax if dry needled over a lung field, and to seek immediate medical attention should they occur. Patient verbalized understanding of these instructions and education.  Patient Consent Given: Yes Education handout provided: Previously provided Muscles treated: Lt gluteals  Treatment response/outcome: Utilized skilled palpation to identify trigger points.  During dry needling able to palpate muscle twitch and muscle elongation  Skilled palpation and monitoring by PT during dry needling  Manual:  Addaday to left hip in sidelying after needling   07/27/2021: Nustep level 5 x 6 min with PT present to discuss status Standing hip flexor stretch at step 2x20 sec LLE Standing hamstring stretch at step 2x20 sec LLE Standing hip abduction and extension x10 bil each Supine Lt LE SLR 2x10 Sidelying LLE hip adduction hip abduction 2x10 Prone LLE hip extension and HS curl 2x10 each Sit to/from stand with RLE outstretched to encourage increased weightbearing through LLE 2x10 Side stepping with green loop 3 x12 ft Fwd monster walk 4x12 ft Addaday to left hip in sidelying x8 min  07/26/2021: Nustep level 5 x5 min with PT present to discuss status Standing hip flexor stretch at step 2x20 sec LLE Standing hamstring stretch at step 2x20 sec LLE Seated butterfly stretch 2x20 sec Supine LLE  SLR 2x10 Sidelying LLE hip adduction and hip abduction 2x10 Prone LLE hip extension and HS curl 2x10 each Sit to/from stand with RLE outstretched to encourage increased weightbearing through LLE 2x10 Side stepping with green loop 3 x12 ft Fwd monster walk 4x12 ft Addaday to left hip in sidelying x2 min    PATIENT EDUCATION:  Education details: Initiated HEP, educated on anatomy of the hip and s/s of hip pointer Person educated: Patient Education method: Consulting civil engineer, Media planner, Verbal cues, and Handouts Education comprehension: verbalized understanding, returned demonstration, and verbal cues required     HOME EXERCISE PROGRAM: Access Code: [email or web address] URL: [email or web address] Date: 07/21/2021 Prepared by: Candyce Churn  Exercises - Supine Hamstring Stretch with Strap  - 1 x daily - 7 x weekly - 1 sets - 3 reps - 30 sec hold - Supine ITB Stretch with Strap  - 1 x daily - 7 x weekly - 1 sets - 3 reps - 30 sec hold - Supine Figure 4 Piriformis Stretch  - 1 x daily - 7 x weekly - 1 sets - 3 reps - 30 sec hold - Supine Piriformis Stretch Pulling Heel to Hip  - 1 x daily - 7 x weekly -  1 sets - 3 reps - 30 sec hold - Supine Butterfly Groin Stretch  - 1 x daily - 7 x weekly - 1 sets - 3 reps - 30 sec hold - Side Lunge Adductor Stretch  - 1 x daily - 7 x weekly - 1 sets - 10 reps - 10 hold - Hip Abduction and Adduction Caregiver PROM  - 1 x daily - 7 x weekly - 1 sets - 3 reps - 30 sec hold - Small Range Straight Leg Raise  - 1 x daily - 7 x weekly - 2 sets - 10 reps - Sidelying Hip Abduction  - 1 x daily - 7 x weekly - 2 sets - 10 reps - Prone Hip Extension  - 1 x daily - 7 x weekly - 2 sets - 10 reps - Sidelying Hip Adduction  - 1 x daily - 7 x weekly - 2 sets - 10 reps   ASSESSMENT:   CLINICAL IMPRESSION: Pt reports now 60% overall reduction in Lt gluteal pain since the start of care. He had very little soft tissue restriction with hawks grip soft tissue work.  He is compliant and well motivated.  He should continue to improve.  He should be ok to drop to 1 time per week to monitor and progress hip stability training for patient to follow through with at DC.    Pt continues to require skilled PT to progress towards goal related activities.     OBJECTIVE IMPAIRMENTS Abnormal gait, difficulty walking, decreased ROM, decreased strength, hypomobility, increased fascial restrictions, increased muscle spasms, impaired flexibility, and pain.    ACTIVITY LIMITATIONS carrying, lifting, bending, sitting, standing, squatting, sleeping, stairs, transfers, and dressing   PARTICIPATION LIMITATIONS: cleaning, laundry, driving, shopping, occupation, and yard work   PERSONAL FACTORS Fitness, Profession, and Time since onset of injury/illness/exacerbation are also affecting patient's functional outcome.    REHAB POTENTIAL: Good   CLINICAL DECISION MAKING: Stable/uncomplicated   EVALUATION COMPLEXITY: Low     GOALS: Goals reviewed with patient? Yes   SHORT TERM GOALS: Target date: 09/13/2021  Patient will be independent with initial HEP  Baseline: Goal status: MET  2.  Pain  report to be no greater than 4/10  Baseline: 3-4/10 max (08/01/21) Goal status: MET  3.  Normal heel to toe gait Baseline:  Goal status: INITIAL     LONG TERM GOALS: Target date: 09/13/2021    Patient to be independent with advanced HEP  Baseline:  Goal status: INITIAL   2.  Patient to report pain no greater than 2/10  Baseline: 3-4/10 (08/01/21) Goal status: In progress    3.  Patient to be able to sleep through the night  Baseline:  Goal status: INITIAL   4.  FOTO to be 75 Baseline:  Goal status: INITIAL   5.  Patient to be able to don/doff shoes and socks and apply lotion to his feet without pain Baseline:  Goal status: INITIAL   6.  Patient to report 85% improvement in overall symptoms.  Baseline: 50% reported (08/01/21) Goal status: INITIAL     PLAN: PT FREQUENCY: 1-2x/week   PT DURATION: 8 weeks   PLANNED INTERVENTIONS: Therapeutic exercises, Therapeutic activity, Neuromuscular re-education, Balance training, Gait training, Patient/Family education, Self Care, Joint mobilization, Stair training, Aquatic Therapy, Dry Needling, Electrical stimulation, Cryotherapy, Moist heat, Taping, Ultrasound, Ionotophoresis 30m/ml Dexamethasone, Manual therapy, and Re-evaluation   PLAN FOR NEXT SESSION:  Patient will drop to one time per week for the next 4 weeks.  DN if indicated, addaday, Gluteal strength, flexibility and manual to address pain.   JAnderson MaltaB. Parthenia Tellefsen, PT 08/03/21 9:34 AM  BThompson's Station[address] STimkenGLakeview [place] [address]Phone # [phone] [phone]

## 2021-08-08 ENCOUNTER — Ambulatory Visit: Payer: 59 | Attending: Family Medicine | Admitting: Rehabilitative and Restorative Service Providers"

## 2021-08-08 ENCOUNTER — Encounter: Payer: Self-pay | Admitting: Rehabilitative and Restorative Service Providers"

## 2021-08-08 DIAGNOSIS — R252 Cramp and spasm: Secondary | ICD-10-CM | POA: Insufficient documentation

## 2021-08-08 DIAGNOSIS — R262 Difficulty in walking, not elsewhere classified: Secondary | ICD-10-CM | POA: Insufficient documentation

## 2021-08-08 DIAGNOSIS — M25552 Pain in left hip: Secondary | ICD-10-CM | POA: Diagnosis present

## 2021-08-08 DIAGNOSIS — M6281 Muscle weakness (generalized): Secondary | ICD-10-CM | POA: Insufficient documentation

## 2021-08-08 NOTE — Therapy (Signed)
OUTPATIENT PHYSICAL THERAPY TREATMENT NOTE   [name] MRN: [id number] DOB:03/24/1965, 56 y.o., male Today's Date: 08/08/2021  PCP: Lujean Amel, MD REFERRING PROVIDER: [place] Fitz, MD   END OF SESSION:   PT End of Session - 08/08/21 0850     Visit Number 7    Date for PT Re-Evaluation 09/13/21    Authorization Type [employer]    PT Start Time [phone]    PT Stop Time 0925    PT Time Calculation (min) 39 min    Activity Tolerance Patient tolerated treatment well    Behavior During Therapy WFL for tasks assessed/performed               Past Medical History:  Diagnosis Date   Diabetes mellitus without complication ([place])    Type II   Family history of colon cancer    Family history of prostate cancer    Family history of stomach cancer    Family history of uterine cancer    History of chemotherapy LAST CHEMO NOV 2019   Hyperlipidemia    Neuropathy    FEET AND HANDS PAIN IN LEGS AT TIMES   Past Surgical History:  Procedure Laterality Date   ACHILLES TENDON REPAIR Right 2005   APPENDECTOMY     CYSTOSCOPY WITH STENT PLACEMENT Bilateral 01/17/2018   Procedure: CYSTOSCOPY WITH STENT PLACEMENT;  Surgeon: Kathie Rhodes, MD;  Location: [facility];  Service: Urology;  Laterality: Bilateral;   FLEXIBLE SIGMOIDOSCOPY N/A 01/17/2018   Procedure: FLEXIBLE SIGMOIDOSCOPY;  Surgeon: Ileana Roup, MD;  Location: [facility];  Service: General;  Laterality: N/A;   HERNIA REPAIR     as a baby   ILEOSTOMY N/A 01/17/2018   Procedure: possible diverting LOOP ILEOSTOMY;  Surgeon: Ileana Roup, MD;  Location: [facility];  Service: General;  Laterality: N/A;   PORT-A-CATH REMOVAL Right 02/17/2018   Procedure: REMOVAL OF PORT-A-CATH;  Surgeon: Ileana Roup, MD;  Location: [facility];  Service: General;  Laterality: Right;   PORTACATH PLACEMENT N/A 06/17/2017   Procedure: RIGHT VS LEFT INTERNAL JUGULAR PORT-A-CATH PLACEMENT WITH ULTRASOUND;  Surgeon: Ileana Roup, MD;  Location: [facility];  Service: General;  Laterality: N/A;  FLURO   Patient Active Problem List   Diagnosis Date Noted   Insulin-requiring or dependent type II diabetes mellitus ([place])    Hyperlipidemia    Neuropathy    Genetic testing 08/27/2017   Family history of colon cancer    Family history of uterine cancer    Family history of stomach cancer    Family history of prostate cancer    Port-A-Cath in place 06/20/2017   Rectal cancer s/p robotic LAR rectosigmoid resection 01/17/2018 06/12/2017   Other and unspecified hyperlipidemia 06/19/2013    REFERRING DIAG: Left hip strain and impingement   THERAPY DIAG:  Pain in left hip  Cramp and spasm  Difficulty in walking, not elsewhere classified  Muscle weakness (generalized)  Rationale for Evaluation and Treatment Rehabilitation  PERTINENT HISTORY: recent remission from rectal cancer  PRECAUTIONS: none  SUBJECTIVE: Pt reports that his pain starts when he is crossing his legs to lotion.  PAIN:  Are you having pain? Yes: NPRS scale: 3/10 Pain location: left hip Pain description: aching Aggravating factors: standing and walking Relieving factors: meds, stretching   OBJECTIVE: (objective measures completed at initial evaluation unless otherwise dated)   OBJECTIVE:    DIAGNOSTIC FINDINGS: none   PATIENT SURVEYS:  FOTO 59, goal is 68  COGNITION:           Overall cognitive status: Within functional limits for tasks assessed                           MUSCLE LENGTH: Hamstrings: Right 50 deg; Left 50 deg Thomas test: Right pos ; Left pos    POSTURE: rounded shoulders, forward head, and decreased lumbar lordosis   PALPATION: Tender to palpation per patient at just below iliac crest of left hip   LOWER EXTREMITY ROM:   WFL   LOWER EXTREMITY MMT:   Generally 4 to 4+/5   LOWER EXTREMITY SPECIAL TESTS:  Hip special tests: Saralyn Pilar (FABER) test: positive  and Thomas test: positive     FUNCTIONAL TESTS:  5 times sit to stand: 9.66 sec Timed up and go (TUG): 6.44 sec   GAIT: Distance walked: 50 Assistive device utilized: None Level of assistance: Complete Independence Comments: antalgic gait, limp     TODAY'S TREATMENT:   08/08/2021: Nustep level 5 x 7 min with PT present to discuss status Seated figure 4 2x20 seconds Sit to/from stand with RLE outstretched to encourage increased weightbearing through LLE 2x10 added 5# kettlebell Wall squat 2x10 with hold at down position for 5 sec Standing hamstring stretch at step 3x20 sec LLE (less verbal cues for correct technique- patient was rounding back) Leg press (seat at 7) 100# x10 with both LE, 50# 2x10 LLE only Hip Matrix:  40# 2x8, LLE only hip abduction and hip extension (pt able to self correct his posture) FWD partial lunges onto bosu 2x10 bilat Manual:  Addaday in sidelying to left hip/thigh region with pt reporting decreased pain following  08/03/2021: Nustep level 5 x 5 min with PT present to discuss status Seated figure 4 3x20 seconds Sit to/from stand with RLE outstretched to encourage increased weightbearing through LLE 2x10 added 5# kettlebell Standing hamstring stretch at step 3x20 sec LLE (verbal cues for correct technique- patient was rounding back) Standing hip abduction and extension 2 x10 bil each Side stepping with blue loop 3 x 10 steps Cone touches: (cone on crate) 3 x 10 1 D ball toss in single leg stance at trampoline 3 x 20 red plyo ball Right side lying addaday to left hip just distal to iliac crest x 5 min Right side lying hawks grip for soft tissue mobilization x 3 min just distal to iliac crest.    08/01/2021: Nustep level 5 x 6 min with PT present to discuss status Seated figure 4 3x20 seconds Seated hamstring stretch at step 2x20 sec LLE Standing hip abduction and extension x10 bil each Sit to/from stand with RLE outstretched to encourage increased weightbearing through LLE 2x10 added  5# kettlebell Side stepping with green loop 3 x12 ft Trigger Point Dry-Needling  Treatment instructions: Expect mild to moderate muscle soreness. S/S of pneumothorax if dry needled over a lung field, and to seek immediate medical attention should they occur. Patient verbalized understanding of these instructions and education.  Patient Consent Given: Yes Education handout provided: Previously provided Muscles treated: Lt gluteals  Treatment response/outcome: Utilized skilled palpation to identify trigger points.  During dry needling able to palpate muscle twitch and muscle elongation  Skilled palpation and monitoring by PT during dry needling  Manual: Addaday to left hip in sidelying after needling      PATIENT EDUCATION:  Education details: Initiated HEP, educated on anatomy of the hip and s/s of hip pointer  Person educated: Patient Education method: Explanation, Demonstration, Verbal cues, and Handouts Education comprehension: verbalized understanding, returned demonstration, and verbal cues required     HOME EXERCISE PROGRAM: Access Code: [email or web address] URL: [email or web address] Date: 07/21/2021 Prepared by: Candyce Churn  Exercises - Supine Hamstring Stretch with Strap  - 1 x daily - 7 x weekly - 1 sets - 3 reps - 30 sec hold - Supine ITB Stretch with Strap  - 1 x daily - 7 x weekly - 1 sets - 3 reps - 30 sec hold - Supine Figure 4 Piriformis Stretch  - 1 x daily - 7 x weekly - 1 sets - 3 reps - 30 sec hold - Supine Piriformis Stretch Pulling Heel to Hip  - 1 x daily - 7 x weekly - 1 sets - 3 reps - 30 sec hold - Supine Butterfly Groin Stretch  - 1 x daily - 7 x weekly - 1 sets - 3 reps - 30 sec hold - Side Lunge Adductor Stretch  - 1 x daily - 7 x weekly - 1 sets - 10 reps - 10 hold - Hip Abduction and Adduction Caregiver PROM  - 1 x daily - 7 x weekly - 1 sets - 3 reps - 30 sec hold - Small Range Straight Leg Raise  - 1 x daily - 7 x weekly - 2 sets - 10 reps -  Sidelying Hip Abduction  - 1 x daily - 7 x weekly - 2 sets - 10 reps - Prone Hip Extension  - 1 x daily - 7 x weekly - 2 sets - 10 reps - Sidelying Hip Adduction  - 1 x daily - 7 x weekly - 2 sets - 10 reps   ASSESSMENT:   CLINICAL IMPRESSION: Felis presents to skilled rehab with reports of continued improvements.  Pt able to tolerate addition of weight machines today with some muscle fatigue reported, but did not report increased pain.  Pt with relief of pain at end with addaday.  Pt continues to require skilled PT to progress towards goal related activities.     OBJECTIVE IMPAIRMENTS Abnormal gait, difficulty walking, decreased ROM, decreased strength, hypomobility, increased fascial restrictions, increased muscle spasms, impaired flexibility, and pain.    ACTIVITY LIMITATIONS carrying, lifting, bending, sitting, standing, squatting, sleeping, stairs, transfers, and dressing   PARTICIPATION LIMITATIONS: cleaning, laundry, driving, shopping, occupation, and yard work   PERSONAL FACTORS Fitness, Profession, and Time since onset of injury/illness/exacerbation are also affecting patient's functional outcome.    REHAB POTENTIAL: Good   CLINICAL DECISION MAKING: Stable/uncomplicated   EVALUATION COMPLEXITY: Low     GOALS: Goals reviewed with patient? Yes   SHORT TERM GOALS: Target date: 09/13/2021  Patient will be independent with initial HEP  Baseline: Goal status: MET  2.  Pain report to be no greater than 4/10  Baseline: 3-4/10 max (08/01/21) Goal status: MET   3.  Normal heel to toe gait Baseline:  Goal status: Ongoing     LONG TERM GOALS: Target date: 09/13/2021    Patient to be independent with advanced HEP  Baseline:  Goal status: INITIAL   2.  Patient to report pain no greater than 2/10  Baseline: 3-4/10 (08/01/21) Goal status: In progress    3.  Patient to be able to sleep through the night  Baseline:  Goal status: INITIAL   4.  FOTO to be 75 Baseline:  Goal  status: INITIAL   5.  Patient to be able to don/doff shoes and  socks and apply lotion to his feet without pain Baseline:  Goal status: INITIAL   6.  Patient to report 85% improvement in overall symptoms.  Baseline: 50% reported (08/01/21) Goal status: INITIAL     PLAN: PT FREQUENCY: 1-2x/week   PT DURATION: 8 weeks   PLANNED INTERVENTIONS: Therapeutic exercises, Therapeutic activity, Neuromuscular re-education, Balance training, Gait training, Patient/Family education, Self Care, Joint mobilization, Stair training, Aquatic Therapy, Dry Needling, Electrical stimulation, Cryotherapy, Moist heat, Taping, Ultrasound, Ionotophoresis 55m/ml Dexamethasone, Manual therapy, and Re-evaluation   PLAN FOR NEXT SESSION:  Patient will drop to one time per week for the next 4 weeks.  DN if indicated, addaday, Gluteal strength, flexibility and manual to address pain.   SJuel Burrow PT 08/08/21 9:46 AM   [facility] [address] [place] [address]Phone # [phone] [phone]

## 2021-08-15 ENCOUNTER — Ambulatory Visit: Payer: 59

## 2021-08-15 DIAGNOSIS — R252 Cramp and spasm: Secondary | ICD-10-CM

## 2021-08-15 DIAGNOSIS — R262 Difficulty in walking, not elsewhere classified: Secondary | ICD-10-CM

## 2021-08-15 DIAGNOSIS — M25552 Pain in left hip: Secondary | ICD-10-CM | POA: Diagnosis not present

## 2021-08-15 DIAGNOSIS — M6281 Muscle weakness (generalized): Secondary | ICD-10-CM

## 2021-08-15 NOTE — Therapy (Signed)
OUTPATIENT PHYSICAL THERAPY TREATMENT NOTE   [name] MRN: [id number] DOB:10/29/1965, 56 y.o., male Today's Date: 08/15/2021  PCP: Lujean Amel, MD REFERRING PROVIDER: [place] Fitz, MD   END OF SESSION:   PT End of Session - 08/15/21 0841     Visit Number 8    Date for PT Re-Evaluation 09/13/21    Authorization Type Aetna    PT Start Time 0802    PT Stop Time 0842    PT Time Calculation (min) 40 min    Activity Tolerance Patient tolerated treatment well    Behavior During Therapy WFL for tasks assessed/performed                Past Medical History:  Diagnosis Date   Diabetes mellitus without complication ([place])    Type II   Family history of colon cancer    Family history of prostate cancer    Family history of stomach cancer    Family history of uterine cancer    History of chemotherapy LAST CHEMO NOV 2019   Hyperlipidemia    Neuropathy    FEET AND HANDS PAIN IN LEGS AT TIMES   Past Surgical History:  Procedure Laterality Date   ACHILLES TENDON REPAIR Right 2005   APPENDECTOMY     CYSTOSCOPY WITH STENT PLACEMENT Bilateral 01/17/2018   Procedure: CYSTOSCOPY WITH STENT PLACEMENT;  Surgeon: Kathie Rhodes, MD;  [facility];  Service: Urology;  Laterality: Bilateral;   FLEXIBLE SIGMOIDOSCOPY N/A 01/17/2018   Procedure: FLEXIBLE SIGMOIDOSCOPY;  Surgeon: Ileana Roup, MD;  Location: [facility];  Service: General;  Laterality: N/A;   HERNIA REPAIR     as a baby   ILEOSTOMY N/A 01/17/2018   Procedure: possible diverting LOOP ILEOSTOMY;  Surgeon: Ileana Roup, MD;  Location: [facility];  Service: General;  Laterality: N/A;   PORT-A-CATH REMOVAL Right 02/17/2018   Procedure: REMOVAL OF PORT-A-CATH;  Surgeon: Ileana Roup, MD;  Location: [facility];  Service: General;  Laterality: Right;   PORTACATH PLACEMENT N/A 06/17/2017   Procedure: RIGHT VS LEFT INTERNAL JUGULAR PORT-A-CATH PLACEMENT WITH ULTRASOUND;  Surgeon: Ileana Roup, MD;  Location: [facility];  Service: General;  Laterality: N/A;  FLURO   Patient Active Problem List   Diagnosis Date Noted   Insulin-requiring or dependent type II diabetes mellitus ([place])    Hyperlipidemia    Neuropathy    Genetic testing 08/27/2017   Family history of colon cancer    Family history of uterine cancer    Family history of stomach cancer    Family history of prostate cancer    Port-A-Cath in place 06/20/2017   Rectal cancer s/p robotic LAR rectosigmoid resection 01/17/2018 06/12/2017   Other and unspecified hyperlipidemia 06/19/2013    REFERRING DIAG: Left hip strain and impingement   THERAPY DIAG:  Pain in left hip  Cramp and spasm  Difficulty in walking, not elsewhere classified  Muscle weakness (generalized)  Rationale for Evaluation and Treatment Rehabilitation  PERTINENT HISTORY: recent remission from rectal cancer  PRECAUTIONS: none  SUBJECTIVE: My quads were sore after squatting last session.  My original symptoms are 70% better overall.   PAIN:  Are you having pain? Yes: NPRS scale: 3/10 Pain location: left hip Pain description: aching Aggravating factors: standing and walking Relieving factors: meds, stretching   OBJECTIVE: (objective measures completed at initial evaluation unless otherwise dated)   OBJECTIVE:    DIAGNOSTIC FINDINGS: none   PATIENT SURVEYS:  FOTO 59, goal  is 16   COGNITION:           Overall cognitive status: Within functional limits for tasks assessed                           MUSCLE LENGTH: Hamstrings: Right 50 deg; Left 50 deg Thomas test: Right pos ; Left pos    POSTURE: rounded shoulders, forward head, and decreased lumbar lordosis   PALPATION: Tender to palpation per patient at just below iliac crest of left hip   LOWER EXTREMITY ROM:   WFL   LOWER EXTREMITY MMT:   Generally 4 to 4+/5   LOWER EXTREMITY SPECIAL TESTS:  Hip special tests: Saralyn Pilar (FABER) test: positive  and Thomas test:  positive    FUNCTIONAL TESTS:  5 times sit to stand: 9.66 sec Timed up and go (TUG): 6.44 sec   GAIT: Distance walked: 50 Assistive device utilized: None Level of assistance: Complete Independence Comments: antalgic gait, limp     TODAY'S TREATMENT:   08/15/2021: Nustep level 5 x 7 min with PT present to discuss status Seated figure 4 2x20 seconds Sit to/from stand 2x10 added 5# kettlebell Wall squat 2x10 with hold at down position for 5 sec- partial ROM to reduce quad soreness Standing hamstring stretch at step 3x20 sec LLE (less verbal cues for correct technique- patient was rounding back) Leg press (seat at 7) 100# x10 with both LE, 50# 2x10 Lt LE only Hip Matrix:  40# 2x8, LLE only hip abduction and hip extension (pt able to self correct his posture) FWD partial lunges onto bosu 2x10 bilat Manual:  Addaday in sidelying to left hip/thigh region with pt reporting decreased pain following  08/08/2021: Nustep level 5 x 7 min with PT present to discuss status Seated figure 4 2x20 seconds Sit to/from stand with RLE outstretched to encourage increased weightbearing through LLE 2x10 added 5# kettlebell Wall squat 2x10 with hold at down position for 5 sec Standing hamstring stretch at step 3x20 sec LLE (less verbal cues for correct technique- patient was rounding back) Leg press (seat at 7) 100# x10 with both LE, 50# 2x10 LLE only Hip Matrix:  40# 2x8, LLE only hip abduction and hip extension (pt able to self correct his posture) FWD partial lunges onto bosu 2x10 bilat Manual:  Addaday in sidelying to left hip/thigh region with pt reporting decreased pain following  08/03/2021: Nustep level 5 x 5 min with PT present to discuss status Seated figure 4 3x20 seconds Sit to/from stand with RLE outstretched to encourage increased weightbearing through LLE 2x10 added 5# kettlebell Standing hamstring stretch at step 3x20 sec LLE (verbal cues for correct technique- patient was rounding  back) Standing hip abduction and extension 2 x10 bil each Side stepping with blue loop 3 x 10 steps Cone touches: (cone on crate) 3 x 10 1 D ball toss in single leg stance at trampoline 3 x 20 red plyo ball Right side lying addaday to left hip just distal to iliac crest x 5 min Right side lying hawks grip for soft tissue mobilization x 3 min just distal to iliac crest.     PATIENT EDUCATION:  Education details: Initiated HEP, educated on anatomy of the hip and s/s of hip pointer Person educated: Patient Education method: Consulting civil engineer, Media planner, Verbal cues, and Handouts Education comprehension: verbalized understanding, returned demonstration, and verbal cues required     HOME EXERCISE PROGRAM: Access Code: [email or web address] URL: [email or web address] Date: 07/21/2021 Prepared  by: Candyce Churn  Exercises - Supine Hamstring Stretch with Strap  - 1 x daily - 7 x weekly - 1 sets - 3 reps - 30 sec hold - Supine ITB Stretch with Strap  - 1 x daily - 7 x weekly - 1 sets - 3 reps - 30 sec hold - Supine Figure 4 Piriformis Stretch  - 1 x daily - 7 x weekly - 1 sets - 3 reps - 30 sec hold - Supine Piriformis Stretch Pulling Heel to Hip  - 1 x daily - 7 x weekly - 1 sets - 3 reps - 30 sec hold - Supine Butterfly Groin Stretch  - 1 x daily - 7 x weekly - 1 sets - 3 reps - 30 sec hold - Side Lunge Adductor Stretch  - 1 x daily - 7 x weekly - 1 sets - 10 reps - 10 hold - Hip Abduction and Adduction Caregiver PROM  - 1 x daily - 7 x weekly - 1 sets - 3 reps - 30 sec hold - Small Range Straight Leg Raise  - 1 x daily - 7 x weekly - 2 sets - 10 reps - Sidelying Hip Abduction  - 1 x daily - 7 x weekly - 2 sets - 10 reps - Prone Hip Extension  - 1 x daily - 7 x weekly - 2 sets - 10 reps - Sidelying Hip Adduction  - 1 x daily - 7 x weekly - 2 sets - 10 reps   ASSESSMENT:   CLINICAL IMPRESSION: Pt reports 70% overall improvement in symptoms since the start of care.  Pt reports quad  soreness after last session that has now resolved.  Pt is doing well with progression of strength exercises and PT monitored for technique and pain throughout session.  Pt is now sleeping through the night without pain. Pt continues to require skilled PT to progress towards goal related activities.     OBJECTIVE IMPAIRMENTS Abnormal gait, difficulty walking, decreased ROM, decreased strength, hypomobility, increased fascial restrictions, increased muscle spasms, impaired flexibility, and pain.    ACTIVITY LIMITATIONS carrying, lifting, bending, sitting, standing, squatting, sleeping, stairs, transfers, and dressing   PARTICIPATION LIMITATIONS: cleaning, laundry, driving, shopping, occupation, and yard work   PERSONAL FACTORS Fitness, Profession, and Time since onset of injury/illness/exacerbation are also affecting patient's functional outcome.    REHAB POTENTIAL: Good   CLINICAL DECISION MAKING: Stable/uncomplicated   EVALUATION COMPLEXITY: Low     GOALS: Goals reviewed with patient? Yes   SHORT TERM GOALS: Target date: 09/13/2021  Patient will be independent with initial HEP  Baseline: Goal status: MET  2.  Pain report to be no greater than 4/10  Baseline: 3-4/10 max (08/01/21) Goal status: MET   3.  Normal heel to toe gait Baseline:  Goal status: MET (08/15/21)     LONG TERM GOALS: Target date: 09/13/2021    Patient to be independent with advanced HEP  Baseline:  Goal status: INITIAL   2.  Patient to report pain no greater than 2/10  Baseline: 3-4/10 (08/01/21) Goal status: In progress    3.  Patient to be able to sleep through the night  Baseline: no pain at night (08/15/21) Goal status: MET   4.  FOTO to be 2 Baseline:  Goal status: INITIAL   5.  Patient to be able to don/doff shoes and socks and apply lotion to his feet without pain Baseline:  Goal status: INITIAL   6.  Patient to report  85% improvement in overall symptoms.  Baseline: 70% reported (08/15/21) Goal  status: In progress      PLAN: PT FREQUENCY: 1-2x/week   PT DURATION: 8 weeks   PLANNED INTERVENTIONS: Therapeutic exercises, Therapeutic activity, Neuromuscular re-education, Balance training, Gait training, Patient/Family education, Self Care, Joint mobilization, Stair training, Aquatic Therapy, Dry Needling, Electrical stimulation, Cryotherapy, Moist heat, Taping, Ultrasound, Ionotophoresis 63m/ml Dexamethasone, Manual therapy, and Re-evaluation   PLAN FOR NEXT SESSION:  Patient will drop to one time per week for the next 4 weeks.  DN if indicated, addaday, Gluteal strength, flexibility and manual to address pain.   KSigurd Sos PT 08/15/21 8:42 AM   [address] [place] [address]Phone # [phone] [phone]

## 2021-08-22 ENCOUNTER — Ambulatory Visit: Payer: 59

## 2021-08-22 DIAGNOSIS — M6281 Muscle weakness (generalized): Secondary | ICD-10-CM

## 2021-08-22 DIAGNOSIS — M25552 Pain in left hip: Secondary | ICD-10-CM

## 2021-08-22 DIAGNOSIS — R252 Cramp and spasm: Secondary | ICD-10-CM

## 2021-08-22 DIAGNOSIS — R262 Difficulty in walking, not elsewhere classified: Secondary | ICD-10-CM

## 2021-08-22 NOTE — Therapy (Signed)
OUTPATIENT PHYSICAL THERAPY TREATMENT NOTE   [name] MRN: [id number] DOB:07/01/1965, 56 y.o., male Today's Date: 08/22/2021  PCP: Lujean Amel, MD REFERRING PROVIDER: Gentry Fitz, MD   END OF SESSION:   PT End of Session - 08/22/21 0858     Visit Number 9    Date for PT Re-Evaluation 09/13/21    Authorization Type [employer]    PT Start Time [phone]    PT Stop Time 0930    PT Time Calculation (min) 37 min    Activity Tolerance Patient tolerated treatment well    Behavior During Therapy WFL for tasks assessed/performed                Past Medical History:  Diagnosis Date   Diabetes mellitus without complication ([place])    Type II   Family history of colon cancer    Family history of prostate cancer    Family history of stomach cancer    Family history of uterine cancer    History of chemotherapy LAST CHEMO NOV 2019   Hyperlipidemia    Neuropathy    FEET AND HANDS PAIN IN LEGS AT TIMES   Past Surgical History:  Procedure Laterality Date   ACHILLES TENDON REPAIR Right 2005   APPENDECTOMY     CYSTOSCOPY WITH STENT PLACEMENT Bilateral 01/17/2018   Procedure: CYSTOSCOPY WITH STENT PLACEMENT;  Surgeon: Kathie Rhodes, MD;  Location: [facility];  Service: Urology;  Laterality: Bilateral;   FLEXIBLE SIGMOIDOSCOPY N/A 01/17/2018   Procedure: FLEXIBLE SIGMOIDOSCOPY;  Surgeon: Ileana Roup, MD;  Location: [facility];  Service: General;  Laterality: N/A;   HERNIA REPAIR     as a baby   ILEOSTOMY N/A 01/17/2018   Procedure: possible diverting LOOP ILEOSTOMY;  Surgeon: Ileana Roup, MD;  Location: [facility];  Service: General;  Laterality: N/A;   PORT-A-CATH REMOVAL Right 02/17/2018   Procedure: REMOVAL OF PORT-A-CATH;  Surgeon: Ileana Roup, MD;  Location: [facility];  Service: General;  Laterality: Right;   PORTACATH PLACEMENT N/A 06/17/2017   Procedure: RIGHT VS LEFT INTERNAL JUGULAR PORT-A-CATH PLACEMENT WITH ULTRASOUND;  Surgeon: Ileana Roup, MD;  Location: [facility];  Service: General;  Laterality: N/A;  FLURO   Patient Active Problem List   Diagnosis Date Noted   Insulin-requiring or dependent type II diabetes mellitus ([place])    Hyperlipidemia    Neuropathy    Genetic testing 08/27/2017   Family history of colon cancer    Family history of uterine cancer    Family history of stomach cancer    Family history of prostate cancer    Port-A-Cath in place 06/20/2017   Rectal cancer s/p robotic LAR rectosigmoid resection 01/17/2018 06/12/2017   Other and unspecified hyperlipidemia 06/19/2013    REFERRING DIAG: Left hip strain and impingement   THERAPY DIAG:  Pain in left hip  Cramp and spasm  Difficulty in walking, not elsewhere classified  Muscle weakness (generalized)  Rationale for Evaluation and Treatment Rehabilitation  PERTINENT HISTORY: recent remission from rectal cancer  PRECAUTIONS: none  SUBJECTIVE: Patient states he had a little set back yesterday.  Hurting more in the anterior portion of the hip.    PAIN:  Are you having pain? Yes: NPRS scale: 3/10 Pain location: left hip Pain description: aching Aggravating factors: standing and walking Relieving factors: meds, stretching   OBJECTIVE: (objective measures completed at initial evaluation unless otherwise dated)   OBJECTIVE:    DIAGNOSTIC FINDINGS: none   PATIENT SURVEYS:  FOTO 59, goal is 32   COGNITION:           Overall cognitive status: Within functional limits for tasks assessed                           MUSCLE LENGTH: Hamstrings: Right 50 deg; Left 50 deg Thomas test: Right pos ; Left pos    POSTURE: rounded shoulders, forward head, and decreased lumbar lordosis   PALPATION: Tender to palpation per patient at just below iliac crest of left hip   LOWER EXTREMITY ROM:   WFL   LOWER EXTREMITY MMT:   Generally 4 to 4+/5   LOWER EXTREMITY SPECIAL TESTS:  Hip special tests: Saralyn Pilar (FABER) test: positive  and  Thomas test: positive    FUNCTIONAL TESTS:  5 times sit to stand: 9.66 sec Timed up and go (TUG): 6.44 sec   GAIT: Distance walked: 50 Assistive device utilized: None Level of assistance: Complete Independence Comments: antalgic gait, limp     TODAY'S TREATMENT:   08/22/2021: Recumbent bike x 5 min with PT present to discuss status Standing hip flexor stretch 3 x 30 sec Supine hip flexor stretch 3 x 30 sec Hook lying butterfly stretch 3 x 30 4 way hip x 20 each Supine IT band and lateral hip stretch 3 x 30 sec each Supine piriformis and hip IR stretch 3 x 30 sec each Seated butterfly stretch 3 x 30 sec Hooklying hip IR and ER combined stretch (both) 3 x 30 sec  08/15/2021: Nustep level 5 x 7 min with PT present to discuss status Seated figure 4 2x20 seconds Sit to/from stand 2x10 added 5# kettlebell Wall squat 2x10 with hold at down position for 5 sec- partial ROM to reduce quad soreness Standing hamstring stretch at step 3x20 sec LLE (less verbal cues for correct technique- patient was rounding back) Leg press (seat at 7) 100# x10 with both LE, 50# 2x10 Lt LE only Hip Matrix:  40# 2x8, LLE only hip abduction and hip extension (pt able to self correct his posture) FWD partial lunges onto bosu 2x10 bilat Manual:  Addaday in sidelying to left hip/thigh region with pt reporting decreased pain following  08/08/2021: Nustep level 5 x 7 min with PT present to discuss status Seated figure 4 2x20 seconds Sit to/from stand with RLE outstretched to encourage increased weightbearing through LLE 2x10 added 5# kettlebell Wall squat 2x10 with hold at down position for 5 sec Standing hamstring stretch at step 3x20 sec LLE (less verbal cues for correct technique- patient was rounding back) Leg press (seat at 7) 100# x10 with both LE, 50# 2x10 LLE only Hip Matrix:  40# 2x8, LLE only hip abduction and hip extension (pt able to self correct his posture) FWD partial lunges onto bosu 2x10  bilat Manual:  Addaday in sidelying to left hip/thigh region with pt reporting decreased pain following    PATIENT EDUCATION:  Education details: Initiated HEP, educated on anatomy of the hip and s/s of hip pointer Person educated: Patient Education method: Consulting civil engineer, Media planner, Verbal cues, and Handouts Education comprehension: verbalized understanding, returned demonstration, and verbal cues required     HOME EXERCISE PROGRAM: Access Code: [email or web address] URL: [email or web address] Date: 08/22/2021 Prepared by: Candyce Churn  Exercises - Supine Hamstring Stretch with Strap  - 1 x daily - 7 x weekly - 1 sets - 3 reps - 30 sec hold - Supine ITB Stretch with Strap  - 1 x  daily - 7 x weekly - 1 sets - 3 reps - 30 sec hold - Supine Figure 4 Piriformis Stretch  - 1 x daily - 7 x weekly - 1 sets - 3 reps - 30 sec hold - Supine Piriformis Stretch Pulling Heel to Hip  - 1 x daily - 7 x weekly - 1 sets - 3 reps - 30 sec hold - Supine Butterfly Groin Stretch  - 1 x daily - 7 x weekly - 1 sets - 3 reps - 30 sec hold - Side Lunge Adductor Stretch  - 1 x daily - 7 x weekly - 1 sets - 10 reps - 10 hold - Hip Abduction and Adduction Caregiver PROM  - 1 x daily - 7 x weekly - 1 sets - 3 reps - 30 sec hold - Small Range Straight Leg Raise  - 1 x daily - 7 x weekly - 2 sets - 10 reps - Sidelying Hip Abduction  - 1 x daily - 7 x weekly - 2 sets - 10 reps - Prone Hip Extension  - 1 x daily - 7 x weekly - 2 sets - 10 reps - Sidelying Hip Adduction  - 1 x daily - 7 x weekly - 2 sets - 10 reps - Standing Quad Stretch with Table and Chair Support  - 1 x daily - 7 x weekly - 1 sets - 3 reps - 30sec hold - Supine Hip Internal and External Rotation  - 1 x daily - 7 x weekly - 1 sets - 3 reps - 30 sec holdAccess Code: [email or web address] URL: [email or web address] Date: 07/21/2021 Prepared by: Candyce Churn  Exercises - Supine Hamstring Stretch with Strap  - 1 x daily - 7 x weekly - 1  sets - 3 reps - 30 sec hold - Supine ITB Stretch with Strap  - 1 x daily - 7 x weekly - 1 sets - 3 reps - 30 sec hold - Supine Figure 4 Piriformis Stretch  - 1 x daily - 7 x weekly - 1 sets - 3 reps - 30 sec hold - Supine Piriformis Stretch Pulling Heel to Hip  - 1 x daily - 7 x weekly - 1 sets - 3 reps - 30 sec hold - Supine Butterfly Groin Stretch  - 1 x daily - 7 x weekly - 1 sets - 3 reps - 30 sec hold - Side Lunge Adductor Stretch  - 1 x daily - 7 x weekly - 1 sets - 10 reps - 10 hold - Hip Abduction and Adduction Caregiver PROM  - 1 x daily - 7 x weekly - 1 sets - 3 reps - 30 sec hold - Small Range Straight Leg Raise  - 1 x daily - 7 x weekly - 2 sets - 10 reps - Sidelying Hip Abduction  - 1 x daily - 7 x weekly - 2 sets - 10 reps - Prone Hip Extension  - 1 x daily - 7 x weekly - 2 sets - 10 reps - Sidelying Hip Adduction  - 1 x daily - 7 x weekly - 2 sets - 10 reps   ASSESSMENT:   CLINICAL IMPRESSION: Patient is progressing appropriately but did have a slight increase in pain over the weekend.  His locates his symptoms slightly more anteriorly today.  He is very compliant with his HEP.   He responded well to stretches today but did still have some antalgic gait following session.   Pt continues to require skilled  PT to progress towards goal related activities.     OBJECTIVE IMPAIRMENTS Abnormal gait, difficulty walking, decreased ROM, decreased strength, hypomobility, increased fascial restrictions, increased muscle spasms, impaired flexibility, and pain.    ACTIVITY LIMITATIONS carrying, lifting, bending, sitting, standing, squatting, sleeping, stairs, transfers, and dressing   PARTICIPATION LIMITATIONS: cleaning, laundry, driving, shopping, occupation, and yard work   PERSONAL FACTORS Fitness, Profession, and Time since onset of injury/illness/exacerbation are also affecting patient's functional outcome.    REHAB POTENTIAL: Good   CLINICAL DECISION MAKING: Stable/uncomplicated    EVALUATION COMPLEXITY: Low     GOALS: Goals reviewed with patient? Yes   SHORT TERM GOALS: Target date: 09/13/2021  Patient will be independent with initial HEP  Baseline: Goal status: MET  2.  Pain report to be no greater than 4/10  Baseline: 3-4/10 max (08/01/21) Goal status: MET   3.  Normal heel to toe gait Baseline:  Goal status: MET (08/15/21)     LONG TERM GOALS: Target date: 09/13/2021    Patient to be independent with advanced HEP  Baseline:  Goal status: INITIAL   2.  Patient to report pain no greater than 2/10  Baseline: 3-4/10 (08/01/21) Goal status: In progress    3.  Patient to be able to sleep through the night  Baseline: no pain at night (08/15/21) Goal status: MET   4.  FOTO to be 79 Baseline:  Goal status: INITIAL   5.  Patient to be able to don/doff shoes and socks and apply lotion to his feet without pain Baseline:  Goal status: INITIAL   6.  Patient to report 85% improvement in overall symptoms.  Baseline: 70% reported (08/15/21) Goal status: In progress      PLAN: PT FREQUENCY: 1-2x/week   PT DURATION: 8 weeks   PLANNED INTERVENTIONS: Therapeutic exercises, Therapeutic activity, Neuromuscular re-education, Balance training, Gait training, Patient/Family education, Self Care, Joint mobilization, Stair training, Aquatic Therapy, Dry Needling, Electrical stimulation, Cryotherapy, Moist heat, Taping, Ultrasound, Ionotophoresis 39m/ml Dexamethasone, Manual therapy, and Re-evaluation   PLAN FOR NEXT SESSION:  Patient will drop to one time per week for the next 4 weeks.  DN if indicated, addaday, Gluteal strength, flexibility and manual to address pain.   JAnderson MaltaB. Mak Bonny, PT 08/22/21 10:24 AM   [address] [place] [address]Phone # [phone] [phone]

## 2021-08-29 ENCOUNTER — Ambulatory Visit: Payer: 59

## 2021-08-29 DIAGNOSIS — M6281 Muscle weakness (generalized): Secondary | ICD-10-CM

## 2021-08-29 DIAGNOSIS — M25552 Pain in left hip: Secondary | ICD-10-CM

## 2021-08-29 DIAGNOSIS — R262 Difficulty in walking, not elsewhere classified: Secondary | ICD-10-CM

## 2021-08-29 DIAGNOSIS — R252 Cramp and spasm: Secondary | ICD-10-CM

## 2021-08-29 NOTE — Therapy (Signed)
OUTPATIENT PHYSICAL THERAPY TREATMENT NOTE   [name] MRN: [id number] DOB:[birth year], 56 y.o., male Today's Date: 08/29/2021  PCP: Lujean Amel, MD REFERRING PROVIDER: Gentry Fitz, MD   END OF SESSION:   PT End of Session - 08/29/21 0851     Visit Number 10    Date for PT Re-Evaluation 09/13/21    Authorization Type Aet[employer]    PT Start Time 0848    PT Stop Time 0928    PT Time Calculation (min) 40 min    Activity Tolerance Patient tolerated treatment well    Behavior During Therapy WFL for tasks assessed/performed                Past Medical History:  Diagnosis Date   Diabetes mellitus without complication ([place])    Type II   Family history of colon cancer    Family history of prostate cancer    Family history of stomach cancer    Family history of uterine cancer    History of chemotherapy LAST CHEMO NOV 2019   Hyperlipidemia    Neuropathy    FEET AND HANDS PAIN IN LEGS AT TIMES   Past Surgical History:  Procedure Laterality Date   ACHILLES TENDON REPAIR Right 2005   APPENDECTOMY     CYSTOSCOPY WITH STENT PLACEMENT Bilateral 01/17/2018   Procedure: CYSTOSCOPY WITH STENT PLACEMENT;  Surgeon: Kathie Rhodes, MD;  [facility];  Service: Urology;  Laterality: Bilateral;   FLEXIBLE SIGMOIDOSCOPY N/A 01/17/2018   Procedure: FLEXIBLE SIGMOIDOSCOPY;  Surgeon: Ileana Roup, MD;  Location: [facility];  Service: General;  Laterality: N/A;   HERNIA REPAIR     as a baby   ILEOSTOMY N/A 01/17/2018   Procedure: possible diverting LOOP ILEOSTOMY;  Surgeon: Ileana Roup, MD;  [facility]: [facility];  Service: General;  Laterality: N/A;   PORT-A-CATH REMOVAL Right 02/17/2018   Procedure: REMOVAL OF PORT-A-CATH;  Surgeon: Ileana Roup, MD;  [facility];  Service: General;  Laterality: Right;   PORTACATH PLACEMENT N/A 06/17/2017   Procedure: RIGHT VS LEFT INTERNAL JUGULAR PORT-A-CATH PLACEMENT WITH ULTRASOUND;  Surgeon: Ileana Roup, MD;  [facility];  Service: General;  Laterality: N/A;  FLURO   Patient Active Problem List   Diagnosis Date Noted   Insulin-requiring or dependent type II diabetes mellitus ([place])    Hyperlipidemia    Neuropathy    Genetic testing 08/27/2017   Family history of colon cancer    Family history of uterine cancer    Family history of stomach cancer    Family history of prostate cancer    Port-A-Cath in place 06/20/2017   Rectal cancer s/p robotic LAR rectosigmoid resection 01/17/2018 06/12/2017   Other and unspecified hyperlipidemia 06/19/2013    REFERRING DIAG: Left hip strain and impingement   THERAPY DIAG:  Pain in left hip  Cramp and spasm  Difficulty in walking, not elsewhere classified  Muscle weakness (generalized)  Rationale for Evaluation and Treatment Rehabilitation  PERTINENT HISTORY: recent remission from rectal cancer  PRECAUTIONS: none  SUBJECTIVE: Patient states, "pain, pain, pain"  Having increased pain after trip to [place].  Didn't really do a lot of walking, just the traveling and loading/unloading the car.     PAIN:  Are you having pain? Yes: NPRS scale: 3/10 Pain location: left hip Pain description: aching Aggravating factors: standing and walking Relieving factors: meds, stretching   OBJECTIVE: (objective measures completed at initial evaluation unless otherwise dated)   OBJECTIVE:  DIAGNOSTIC FINDINGS: none   PATIENT SURVEYS:  FOTO 59, goal is 23   COGNITION:           Overall cognitive status: Within functional limits for tasks assessed                           MUSCLE LENGTH: Hamstrings: Right 50 deg; Left 50 deg Thomas test: Right pos ; Left pos    POSTURE: rounded shoulders, forward head, and decreased lumbar lordosis   PALPATION: Tender to palpation per patient at just below iliac crest of left hip   LOWER EXTREMITY ROM:   WFL   LOWER EXTREMITY MMT:   Generally 4 to 4+/5   LOWER EXTREMITY SPECIAL  TESTS:  Hip special tests: Saralyn Pilar (FABER) test: positive  and Thomas test: positive    FUNCTIONAL TESTS:  5 times sit to stand: 9.66 sec Timed up and go (TUG): 6.44 sec   GAIT: Distance walked: 50 Assistive device utilized: None Level of assistance: Complete Independence Comments: antalgic gait, limp     TODAY'S TREATMENT:   08/29/2021: Recumbent bike x 5 min with PT present to discuss status Standing hip flexor stretch 3 x 30 sec both Standing hamstring stretch 3 x 30 sec both Hook lying butterfly stretch 3 x 30 sec both Supine IT band and lateral hip stretch 3 x 30 sec both Ultrasound 1.8 w/cm2 x 5 min to left hip in the area of the greater trochanter and just superior to this Hawks grips/ myofascial tissue mobilization to left hip x 10 min   08/22/2021: Recumbent bike x 5 min with PT present to discuss status Standing hip flexor stretch 3 x 30 sec Supine hip flexor stretch 3 x 30 sec Hook lying butterfly stretch 3 x 30 4 way hip x 20 each Supine IT band and lateral hip stretch 3 x 30 sec each Supine piriformis and hip IR stretch 3 x 30 sec each Seated butterfly stretch 3 x 30 sec Hooklying hip IR and ER combined stretch (both) 3 x 30 sec  08/15/2021: Nustep level 5 x 7 min with PT present to discuss status Seated figure 4 2x20 seconds Sit to/from stand 2x10 added 5# kettlebell Wall squat 2x10 with hold at down position for 5 sec- partial ROM to reduce quad soreness Standing hamstring stretch at step 3x20 sec LLE (less verbal cues for correct technique- patient was rounding back) Leg press (seat at 7) 100# x10 with both LE, 50# 2x10 Lt LE only Hip Matrix:  40# 2x8, LLE only hip abduction and hip extension (pt able to self correct his posture) FWD partial lunges onto bosu 2x10 bilat Manual:  Addaday in sidelying to left hip/thigh region with pt reporting decreased pain following  08/08/2021: Nustep level 5 x 7 min with PT present to discuss status Seated figure 4 2x20  seconds Sit to/from stand with RLE outstretched to encourage increased weightbearing through LLE 2x10 added 5# kettlebell Wall squat 2x10 with hold at down position for 5 sec Standing hamstring stretch at step 3x20 sec LLE (less verbal cues for correct technique- patient was rounding back) Leg press (seat at 7) 100# x10 with both LE, 50# 2x10 LLE only Hip Matrix:  40# 2x8, LLE only hip abduction and hip extension (pt able to self correct his posture) FWD partial lunges onto bosu 2x10 bilat Manual:  Addaday in sidelying to left hip/thigh region with pt reporting decreased pain following    PATIENT EDUCATION:  Education details: Initiated HEP, educated on anatomy of the hip and s/s of hip pointer Person educated: Patient Education method: Explanation, Demonstration, Verbal cues, and Handouts Education comprehension: verbalized understanding, returned demonstration, and verbal cues required     HOME EXERCISE PROGRAM: Access Code: [email or web address] URL: [email or web address] Date: 08/22/2021 Prepared by: Candyce Churn  Exercises - Supine Hamstring Stretch with Strap  - 1 x daily - 7 x weekly - 1 sets - 3 reps - 30 sec hold - Supine ITB Stretch with Strap  - 1 x daily - 7 x weekly - 1 sets - 3 reps - 30 sec hold - Supine Figure 4 Piriformis Stretch  - 1 x daily - 7 x weekly - 1 sets - 3 reps - 30 sec hold - Supine Piriformis Stretch Pulling Heel to Hip  - 1 x daily - 7 x weekly - 1 sets - 3 reps - 30 sec hold - Supine Butterfly Groin Stretch  - 1 x daily - 7 x weekly - 1 sets - 3 reps - 30 sec hold - Side Lunge Adductor Stretch  - 1 x daily - 7 x weekly - 1 sets - 10 reps - 10 hold - Hip Abduction and Adduction Caregiver PROM  - 1 x daily - 7 x weekly - 1 sets - 3 reps - 30 sec hold - Small Range Straight Leg Raise  - 1 x daily - 7 x weekly - 2 sets - 10 reps - Sidelying Hip Abduction  - 1 x daily - 7 x weekly - 2 sets - 10 reps - Prone Hip Extension  - 1 x daily - 7 x weekly - 2  sets - 10 reps - Sidelying Hip Adduction  - 1 x daily - 7 x weekly - 2 sets - 10 reps - Standing Quad Stretch with Table and Chair Support  - 1 x daily - 7 x weekly - 1 sets - 3 reps - 30sec hold - Supine Hip Internal and External Rotation  - 1 x daily - 7 x weekly - 1 sets - 3 reps - 30 sec holdAccess Code: [email or web address] URL: [email or web address] Date: 07/21/2021 Prepared by: Candyce Churn  Exercises - Supine Hamstring Stretch with Strap  - 1 x daily - 7 x weekly - 1 sets - 3 reps - 30 sec hold - Supine ITB Stretch with Strap  - 1 x daily - 7 x weekly - 1 sets - 3 reps - 30 sec hold - Supine Figure 4 Piriformis Stretch  - 1 x daily - 7 x weekly - 1 sets - 3 reps - 30 sec hold - Supine Piriformis Stretch Pulling Heel to Hip  - 1 x daily - 7 x weekly - 1 sets - 3 reps - 30 sec hold - Supine Butterfly Groin Stretch  - 1 x daily - 7 x weekly - 1 sets - 3 reps - 30 sec hold - Side Lunge Adductor Stretch  - 1 x daily - 7 x weekly - 1 sets - 10 reps - 10 hold - Hip Abduction and Adduction Caregiver PROM  - 1 x daily - 7 x weekly - 1 sets - 3 reps - 30 sec hold - Small Range Straight Leg Raise  - 1 x daily - 7 x weekly - 2 sets - 10 reps - Sidelying Hip Abduction  - 1 x daily - 7 x weekly - 2 sets - 10 reps - Prone Hip Extension  - 1 x daily -  7 x weekly - 2 sets - 10 reps - Sidelying Hip Adduction  - 1 x daily - 7 x weekly - 2 sets - 10 reps   ASSESSMENT:   CLINICAL IMPRESSION: Patient is having some increase in pain and tenderness in the area of the greater trochanter and just superior to this.  He is inherently very tight in bilateral hamstrings and IT band which is likely aggravating his symptoms.  He admits he has not stretched as much in the past few days and during his out of town trip.  He may benefit from steroid injection but will see how he responds to todays treatment and resuming his stretching regimen.    Pt continues to require skilled PT to progress towards goal related  activities.     OBJECTIVE IMPAIRMENTS Abnormal gait, difficulty walking, decreased ROM, decreased strength, hypomobility, increased fascial restrictions, increased muscle spasms, impaired flexibility, and pain.    ACTIVITY LIMITATIONS carrying, lifting, bending, sitting, standing, squatting, sleeping, stairs, transfers, and dressing   PARTICIPATION LIMITATIONS: cleaning, laundry, driving, shopping, occupation, and yard work   PERSONAL FACTORS Fitness, Profession, and Time since onset of injury/illness/exacerbation are also affecting patient's functional outcome.    REHAB POTENTIAL: Good   CLINICAL DECISION MAKING: Stable/uncomplicated   EVALUATION COMPLEXITY: Low     GOALS: Goals reviewed with patient? Yes   SHORT TERM GOALS: Target date: 09/13/2021  Patient will be independent with initial HEP  Baseline: Goal status: MET  2.  Pain report to be no greater than 4/10  Baseline: 3-4/10 max (08/01/21) Goal status: MET   3.  Normal heel to toe gait Baseline:  Goal status: MET (08/15/21)     LONG TERM GOALS: Target date: 09/13/2021    Patient to be independent with advanced HEP  Baseline:  Goal status: INITIAL   2.  Patient to report pain no greater than 2/10  Baseline: 3-4/10 (08/01/21) Goal status: In progress    3.  Patient to be able to sleep through the night  Baseline: no pain at night (08/15/21) Goal status: MET   4.  FOTO to be 57 Baseline:  Goal status: INITIAL   5.  Patient to be able to don/doff shoes and socks and apply lotion to his feet without pain Baseline:  Goal status: INITIAL   6.  Patient to report 85% improvement in overall symptoms.  Baseline: 70% reported (08/15/21) Goal status: In progress      PLAN: PT FREQUENCY: 1-2x/week   PT DURATION: 8 weeks   PLANNED INTERVENTIONS: Therapeutic exercises, Therapeutic activity, Neuromuscular re-education, Balance training, Gait training, Patient/Family education, Self Care, Joint mobilization, Stair  training, Aquatic Therapy, Dry Needling, Electrical stimulation, Cryotherapy, Moist heat, Taping, Ultrasound, Ionotophoresis 83m/ml Dexamethasone, Manual therapy, and Re-evaluation   PLAN FOR NEXT SESSION:  Patient will drop to one time per week for the next 4 weeks.  DN if indicated, addaday, Gluteal strength, flexibility and manual to address pain.   JAnderson MaltaB. Maynor Mwangi, PT 08/29/21 9:46 AM    [address]agdad[address] SMullikenGJerico Springs [place] [address]Phone # [phone] [phone]

## 2021-09-05 ENCOUNTER — Ambulatory Visit: Payer: 59

## 2021-09-05 DIAGNOSIS — M6281 Muscle weakness (generalized): Secondary | ICD-10-CM

## 2021-09-05 DIAGNOSIS — R262 Difficulty in walking, not elsewhere classified: Secondary | ICD-10-CM

## 2021-09-05 DIAGNOSIS — M25552 Pain in left hip: Secondary | ICD-10-CM | POA: Diagnosis not present

## 2021-09-05 DIAGNOSIS — R252 Cramp and spasm: Secondary | ICD-10-CM

## 2021-09-05 NOTE — Therapy (Signed)
OUTPATIENT PHYSICAL THERAPY TREATMENT NOTE   Patient Name: Rylon Poitra MRN: [id number] DOB:12/31/1965, 56 y.o., male Today's Date: 09/05/2021  PCP: Lujean Amel, MD REFERRING PROVIDER: Gentry Fitz, MD   END OF SESSION:   PT End of Session - 09/05/21 0847     Visit Number 11    Date for PT Re-Evaluation 09/13/21    Authorization Type [employer]et[employer]    PT Start Time 0845    PT Stop Time 0930    PT Time Calculation (min) 45 min    Activity Tolerance Patient tolerated treatment well    Behavior During Therapy WFL for tasks assessed/performed                Past Medical History:  Diagnosis Date   Diabetes mellitus without complication ([place])    Type II   Family history of colon cancer    Family history of prostate cancer    Family history of stomach cancer    Family history of uterine cancer    History of chemotherapy LAST CHEMO NOV 2019   Hyperlipidemia    Neuropathy    FEET AND HANDS PAIN IN LEGS AT TIMES   Past Surgical History:  Procedure Laterality Date   ACHILLES TENDON REPAIR Right 2005   APPENDECTOMY     CYSTOSCOPY WITH STENT PLACEMENT Bilateral 01/17/2018   Procedure: CYSTOSCOPY WITH STENT PLACEMENT;  Surgeon: Kathie Rhodes, MD;  Location: [facility];  Service: Urology;  Laterality: Bilateral;   FLEXIBLE SIGMOIDOSCOPY N/A 01/17/2018   Procedure: FLEXIBLE SIGMOIDOSCOPY;  Surgeon: Ileana Roup, MD;  Location: [facility];  Service: General;  Laterality: N/A;   HERNIA REPAIR     as a baby   ILEOSTOMY N/A 01/17/2018   Procedure: possible diverting LOOP ILEOSTOMY;  Surgeon: Ileana Roup, MD;  Location: [facility];  Service: General;  Laterality: N/A;   PORT-A-CATH REMOVAL Right 02/17/2018   Procedure: REMOVAL OF PORT-A-CATH;  Surgeon: Ileana Roup, MD;  Location: [facility];  Service: General;  Laterality: Right;   PORTACATH PLACEMENT N/A 06/17/2017   Procedure: RIGHT VS LEFT INTERNAL JUGULAR PORT-A-CATH PLACEMENT WITH ULTRASOUND;  Surgeon: Ileana Roup, MD;  Location: [facility];  Service: General;  Laterality: N/A;  FLURO   Patient Active Problem List   Diagnosis Date Noted   Insulin-requiring or dependent type II diabetes mellitus ([place])    Hyperlipidemia    Neuropathy    Genetic testing 08/27/2017   Family history of colon cancer    Family history of uterine cancer    Family history of stomach cancer    Family history of prostate cancer    Port-A-Cath in place 06/20/2017   Rectal cancer s/p robotic LAR rectosigmoid resection 01/17/2018 06/12/2017   Other and unspecified hyperlipidemia 06/19/2013    REFERRING DIAG: Left hip strain and impingement   THERAPY DIAG:  Pain in left hip - Plan: PT plan of care cert/re-cert  Cramp and spasm - Plan: PT plan of care cert/re-cert  Difficulty in walking, not elsewhere classified - Plan: PT plan of care cert/re-cert  Muscle weakness (generalized) - Plan: PT plan of care cert/re-cert  Rationale for Evaluation and Treatment Rehabilitation  PERTINENT HISTORY: recent remission from rectal cancer  PRECAUTIONS: none  SUBJECTIVE: Patient states, "I've started taking the Meloxicam again because the pain had increased"  "I feel better and I am getting ready to go out of town to [place] so I will probably stay on the Meloxicam until after my trip"  PAIN:  Are you having pain? Yes: NPRS scale: 3/10 Pain location: left hip Pain description: aching Aggravating factors: standing and walking Relieving factors: meds, stretching   OBJECTIVE: (objective measures completed at initial evaluation unless otherwise dated)   OBJECTIVE:    DIAGNOSTIC FINDINGS: none   PATIENT SURVEYS:  FOTO 59, goal is 32   COGNITION:           Overall cognitive status: Within functional limits for tasks assessed                           MUSCLE LENGTH: Hamstrings: Right 50 deg; Left 50 deg Thomas test: Right pos ; Left pos    POSTURE: rounded shoulders, forward head, and decreased lumbar  lordosis   PALPATION: Tender to palpation per patient at just below iliac crest of left hip   LOWER EXTREMITY ROM:   WFL   LOWER EXTREMITY MMT:   Generally 4 to 4+/5   LOWER EXTREMITY SPECIAL TESTS:  Hip special tests: Saralyn Pilar (FABER) test: positive  and Thomas test: positive    FUNCTIONAL TESTS:  5 times sit to stand: 9.66 sec (09/05/21 7.23 sec) Timed up and go (TUG): 6.44 sec (09/05/21 5.95 sec)   GAIT: Distance walked: 50 Assistive device utilized: None Level of assistance: Complete Independence Comments: antalgic gait, limp     TODAY'S TREATMENT:   09/05/2021: Nustep x 5 min with PT present to discuss status Re-assessment/Recert completed Hook lying butterfly stretch 3 x 30 sec both Supine IT band and lateral hip stretch 3 x 30 sec both Ultrasound 1.8 w/cm2 x 5 min to left hip in the area of the greater trochanter and just superior to this Hawks grips/ myofascial tissue mobilization to left hip x 10 min   08/29/2021: Recumbent bike x 5 min with PT present to discuss status Standing hip flexor stretch 3 x 30 sec both Standing hamstring stretch 3 x 30 sec both Hook lying butterfly stretch 3 x 30 sec both Supine IT band and lateral hip stretch 3 x 30 sec both Ultrasound 1.8 w/cm2 x 5 min to left hip in the area of the greater trochanter and just superior to this Hawks grips/ myofascial tissue mobilization to left hip x 10 min   08/22/2021: Recumbent bike x 5 min with PT present to discuss status Standing hip flexor stretch 3 x 30 sec Supine hip flexor stretch 3 x 30 sec Hook lying butterfly stretch 3 x 30 4 way hip x 20 each Supine IT band and lateral hip stretch 3 x 30 sec each Supine piriformis and hip IR stretch 3 x 30 sec each Seated butterfly stretch 3 x 30 sec Hooklying hip IR and ER combined stretch (both) 3 x 30 sec   PATIENT EDUCATION:  Education details: Initiated HEP, educated on anatomy of the hip and s/s of hip pointer Person educated:  Patient Education method: Consulting civil engineer, Media planner, Verbal cues, and Handouts Education comprehension: verbalized understanding, returned demonstration, and verbal cues required     HOME EXERCISE PROGRAM: Access Code: [email or web address] URL: [email or web address] Date: 08/22/2021 Prepared by: Candyce Churn  Exercises - Supine Hamstring Stretch with Strap  - 1 x daily - 7 x weekly - 1 sets - 3 reps - 30 sec hold - Supine ITB Stretch with Strap  - 1 x daily - 7 x weekly - 1 sets - 3 reps - 30 sec hold - Supine Figure 4 Piriformis Stretch  - 1 x daily - 7 x  weekly - 1 sets - 3 reps - 30 sec hold - Supine Piriformis Stretch Pulling Heel to Hip  - 1 x daily - 7 x weekly - 1 sets - 3 reps - 30 sec hold - Supine Butterfly Groin Stretch  - 1 x daily - 7 x weekly - 1 sets - 3 reps - 30 sec hold - Side Lunge Adductor Stretch  - 1 x daily - 7 x weekly - 1 sets - 10 reps - 10 hold - Hip Abduction and Adduction Caregiver PROM  - 1 x daily - 7 x weekly - 1 sets - 3 reps - 30 sec hold - Small Range Straight Leg Raise  - 1 x daily - 7 x weekly - 2 sets - 10 reps - Sidelying Hip Abduction  - 1 x daily - 7 x weekly - 2 sets - 10 reps - Prone Hip Extension  - 1 x daily - 7 x weekly - 2 sets - 10 reps - Sidelying Hip Adduction  - 1 x daily - 7 x weekly - 2 sets - 10 reps - Standing Quad Stretch with Table and Chair Support  - 1 x daily - 7 x weekly - 1 sets - 3 reps - 30sec hold - Supine Hip Internal and External Rotation  - 1 x daily - 7 x weekly - 1 sets - 3 reps - 30 sec holdAccess Code: [email or web address] URL: [email or web address] Date: 07/21/2021 Prepared by: Candyce Churn  Exercises - Supine Hamstring Stretch with Strap  - 1 x daily - 7 x weekly - 1 sets - 3 reps - 30 sec hold - Supine ITB Stretch with Strap  - 1 x daily - 7 x weekly - 1 sets - 3 reps - 30 sec hold - Supine Figure 4 Piriformis Stretch  - 1 x daily - 7 x weekly - 1 sets - 3 reps - 30 sec hold - Supine Piriformis  Stretch Pulling Heel to Hip  - 1 x daily - 7 x weekly - 1 sets - 3 reps - 30 sec hold - Supine Butterfly Groin Stretch  - 1 x daily - 7 x weekly - 1 sets - 3 reps - 30 sec hold - Side Lunge Adductor Stretch  - 1 x daily - 7 x weekly - 1 sets - 10 reps - 10 hold - Hip Abduction and Adduction Caregiver PROM  - 1 x daily - 7 x weekly - 1 sets - 3 reps - 30 sec hold - Small Range Straight Leg Raise  - 1 x daily - 7 x weekly - 2 sets - 10 reps - Sidelying Hip Abduction  - 1 x daily - 7 x weekly - 2 sets - 10 reps - Prone Hip Extension  - 1 x daily - 7 x weekly - 2 sets - 10 reps - Sidelying Hip Adduction  - 1 x daily - 7 x weekly - 2 sets - 10 reps   ASSESSMENT:   CLINICAL IMPRESSION: Patient has improved with all objective measures but continues to have pain locally in the area just below the ilium on the left hip.  He has resumed the Meloxicam due to exacerbation of pain.  He continues to have significant hip ROM issues and would benefit from continued skilled PT to address his hip flexibility issues and to continue strength training to stabilize left hip.  Pt continues to require skilled PT to progress towards goal related activities.     OBJECTIVE IMPAIRMENTS  Abnormal gait, difficulty walking, decreased ROM, decreased strength, hypomobility, increased fascial restrictions, increased muscle spasms, impaired flexibility, and pain.    ACTIVITY LIMITATIONS carrying, lifting, bending, sitting, standing, squatting, sleeping, stairs, transfers, and dressing   PARTICIPATION LIMITATIONS: cleaning, laundry, driving, shopping, occupation, and yard work   PERSONAL FACTORS Fitness, Profession, and Time since onset of injury/illness/exacerbation are also affecting patient's functional outcome.    REHAB POTENTIAL: Good   CLINICAL DECISION MAKING: Stable/uncomplicated   EVALUATION COMPLEXITY: Low     GOALS: Goals reviewed with patient? Yes   SHORT TERM GOALS: Target date: 09/13/2021  Patient will be  independent with initial HEP  Baseline: Goal status: MET  2.  Pain report to be no greater than 4/10  Baseline: 3-4/10 max (08/01/21) Goal status: MET   3.  Normal heel to toe gait Baseline:  Goal status: MET (08/15/21)     LONG TERM GOALS: Target date: 09/13/2021    Patient to be independent with advanced HEP  Baseline:  Goal status: Progressing   2.  Patient to report pain no greater than 2/10  Baseline: 3-4/10 (08/01/21) Goal status: In progress    3.  Patient to be able to sleep through the night  Baseline: no pain at night (08/15/21) Goal status: MET   4.  FOTO to be 64 Baseline: 71 on 09/05/21 Goal status: MET   5.  Patient to be able to don/doff shoes and socks and apply lotion to his feet without pain Baseline:  Goal status: Progressing   6.  Patient to report 85% improvement in overall symptoms.  Baseline: 70% reported (08/15/21) (On 09/05/21 patient reported "maybe a tick down from 70%) Goal status: In progress      PLAN: PT FREQUENCY: 1-2x/week   PT DURATION: 8 weeks   PLANNED INTERVENTIONS: Therapeutic exercises, Therapeutic activity, Neuromuscular re-education, Balance training, Gait training, Patient/Family education, Self Care, Joint mobilization, Stair training, Aquatic Therapy, Dry Needling, Electrical stimulation, Cryotherapy, Moist heat, Taping, Ultrasound, Ionotophoresis 50m/ml Dexamethasone, Manual therapy, and Re-evaluation   PLAN FOR NEXT SESSION:  We will continue skilled PT to address hip ROM and flexibility.  We will continue for an additional 6 weeks starting 09/11/21.  He will do 1-2 times per week depending on symptoms.  Continue myofascial work at the left hip, [place] patient responded to initial treatment from last visit.  Iontophoresis #2 if no adverse effects from ionto #1.  DN if indicated, addaday, Gluteal strength, flexibility and manual to address pain. If patient is not responding and able to discontinue the Meloxicam, we will have him see MD for  possible cortisone injection.     JAnderson MaltaB. Celest Reitz, PT 09/05/21 11:54 AM   [facility]St [facility] [address] SSikeston[place] [address]Phone # [phone] [phone]

## 2021-09-18 ENCOUNTER — Ambulatory Visit: Payer: 59

## 2021-09-18 ENCOUNTER — Other Ambulatory Visit: Payer: Self-pay | Admitting: Family Medicine

## 2021-09-18 DIAGNOSIS — M79652 Pain in left thigh: Secondary | ICD-10-CM

## 2021-09-18 DIAGNOSIS — M25552 Pain in left hip: Secondary | ICD-10-CM

## 2021-09-20 ENCOUNTER — Ambulatory Visit: Payer: 59 | Attending: Family Medicine

## 2021-09-20 DIAGNOSIS — R252 Cramp and spasm: Secondary | ICD-10-CM | POA: Diagnosis present

## 2021-09-20 DIAGNOSIS — R262 Difficulty in walking, not elsewhere classified: Secondary | ICD-10-CM | POA: Diagnosis present

## 2021-09-20 DIAGNOSIS — M6281 Muscle weakness (generalized): Secondary | ICD-10-CM | POA: Insufficient documentation

## 2021-09-20 DIAGNOSIS — M25552 Pain in left hip: Secondary | ICD-10-CM | POA: Diagnosis present

## 2021-09-20 NOTE — Therapy (Addendum)
OUTPATIENT PHYSICAL THERAPY TREATMENT NOTE  PHYSICAL THERAPY DISCHARGE SUMMARY  Visits from Start of Care: 12  Current functional level related to goals / functional outcomes: See below   Remaining deficits: See below   Education / Equipment: See below   Patient agrees to discharge. Patient goals were partially met. Patient is being discharged due to maximized rehab potential.    [name] MRN: [id number] DOB:[birth year], 56 y.o., male Today's Date: 09/20/2021  PCP: Charles Amel, MD REFERRING PROVIDER: Gentry Fitz, MD   END OF SESSION:   PT End of Session - 09/20/21 0848     Visit Number 12    Date for PT Re-Evaluation 10/18/21    Authorization Type Aetna    PT Start Time [phone]    PT Stop Time 0930    PT Time Calculation (min) 42 min    Activity Tolerance Patient tolerated treatment well    Behavior During Therapy WFL for tasks assessed/performed                Past Medical History:  Diagnosis Date   Diabetes mellitus without complication ([place])    Type II   Family history of colon cancer    Family history of prostate cancer    Family history of stomach cancer    Family history of uterine cancer    History of chemotherapy LAST CHEMO NOV 2019   Hyperlipidemia    Neuropathy    FEET AND HANDS PAIN IN LEGS AT TIMES   Past Surgical History:  Procedure Laterality Date   ACHILLES TENDON REPAIR Right 2005   APPENDECTOMY     CYSTOSCOPY WITH STENT PLACEMENT Bilateral 01/17/2018   Procedure: CYSTOSCOPY WITH STENT PLACEMENT;  Surgeon: Charles Rhodes, MD;  Location: [facility];  Service: Urology;  Laterality: Bilateral;   FLEXIBLE SIGMOIDOSCOPY N/A 01/17/2018   Procedure: FLEXIBLE SIGMOIDOSCOPY;  Surgeon: Charles Roup, MD;  Location: [facility];  Service: General;  Laterality: N/A;   HERNIA REPAIR     as a baby   ILEOSTOMY N/A 01/17/2018   Procedure: possible diverting LOOP ILEOSTOMY;  Surgeon: Charles Roup, MD;  Location: [facility];   Service: General;  Laterality: N/A;   PORT-A-CATH REMOVAL Right 02/17/2018   Procedure: REMOVAL OF PORT-A-CATH;  Surgeon: Charles Roup, MD;  Location: [facility];  Service: General;  Laterality: Right;   PORTACATH PLACEMENT N/A 06/17/2017   Procedure: RIGHT VS LEFT INTERNAL JUGULAR PORT-A-CATH PLACEMENT WITH ULTRASOUND;  Surgeon: Charles Roup, MD;  Location: [facility];  Service: General;  Laterality: N/A;  FLURO   Patient Active Problem List   Diagnosis Date Noted   Insulin-requiring or dependent type II diabetes mellitus ([place])    Hyperlipidemia    Neuropathy    Genetic testing 08/27/2017   Family history of colon cancer    Family history of uterine cancer    Family history of stomach cancer    Family history of prostate cancer    Port-A-Cath in place 06/20/2017   Rectal cancer s/p robotic LAR rectosigmoid resection 01/17/2018 06/12/2017   Other and unspecified hyperlipidemia 06/19/2013    REFERRING DIAG: Left hip strain and impingement   THERAPY DIAG:  Pain in left hip  Cramp and spasm  Difficulty in walking, not elsewhere classified  Muscle weakness (generalized)  Rationale for Evaluation and Treatment Rehabilitation  PERTINENT HISTORY: recent remission from rectal cancer  PRECAUTIONS: none  SUBJECTIVE: Patient states he continues to have pain.  He saw MD again and MD has  ordered MRI.  He has switched from Meloxicam to ES Tylenol 1 tablet every 8 hours and this seems to be helping better than the Meloxicam at this point.    PAIN:  Are you having pain? Yes: NPRS scale: 3/10 Pain location: left hip Pain description: aching Aggravating factors: standing and walking Relieving factors: meds, stretching   OBJECTIVE: (objective measures completed at initial evaluation unless otherwise dated)   OBJECTIVE:    DIAGNOSTIC FINDINGS: none   PATIENT SURVEYS:  FOTO 59, goal is 31   COGNITION:           Overall cognitive status: Within functional limits for tasks  assessed                           MUSCLE LENGTH: Hamstrings: Right 50 deg; Left 50 deg Thomas test: Right pos ; Left pos    POSTURE: rounded shoulders, forward head, and decreased lumbar lordosis   PALPATION: Tender to palpation per patient at just below iliac crest of left hip   LOWER EXTREMITY ROM:   WFL   LOWER EXTREMITY MMT:   Generally 4 to 4+/5   LOWER EXTREMITY SPECIAL TESTS:  Hip special tests: Charles [name] (FABER) test: positive  and Thomas test: positive    FUNCTIONAL TESTS:  5 times sit to stand: 9.66 sec (09/05/21 7.23 sec) Timed up and go (TUG): 6.44 sec (09/05/21 5.95 sec)   GAIT: Distance walked: 50 Assistive device utilized: None Level of assistance: Complete Independence Comments: antalgic gait, limp     TODAY'S TREATMENT:   09/20/2021: Recumbent bike x 5 min with PT present to discuss status Leg press 100 # x 20 both, 80# left only x 20 Multi hip: left hip abduction and extension x 20 each direction 55 lb Supine hip combined hip IR/ER stretch x 5 each side hold 10 sec each Prone Manual hip IR stretch x 5 each side holding 10 sec each Prone Manual quad hip flexor stretch  x 5 each side x 10 sec each Side lying hip abduction x 20 each Supine hamstring stretch 3 x 30 sec each LE Supine ITB/lateral hip stretch 3 x 30 sec Right side lying clam with green band x 20   09/05/2021: Nustep x 5 min with PT present to discuss status Re-assessment/Recert completed Hook lying butterfly stretch 3 x 30 sec both Supine IT band and lateral hip stretch 3 x 30 sec both Ultrasound 1.8 w/cm2 x 5 min to left hip in the area of the greater trochanter and just superior to this Charles [name] grips/ myofascial tissue mobilization to left hip x 10 min   08/29/2021: Recumbent bike x 5 min with PT present to discuss status Standing hip flexor stretch 3 x 30 sec both Standing hamstring stretch 3 x 30 sec both Hook lying butterfly stretch 3 x 30 sec both Supine IT band and lateral hip  stretch 3 x 30 sec both Ultrasound 1.8 w/cm2 x 5 min to left hip in the area of the greater trochanter and just superior to this Charles [name] grips/ myofascial tissue mobilization to left hip x 10 min    PATIENT EDUCATION:  Education details: Initiated HEP, educated on anatomy of the hip and s/s of hip pointer Person educated: Patient Education method: Consulting civil engineer, Media planner, Verbal cues, and Handouts Education comprehension: verbalized understanding, returned demonstration, and verbal cues required     HOME EXERCISE PROGRAM: Access Code: [email or web address] URL: [email or web address] Date: 08/22/2021 Prepared by: Candyce Churn  Exercises -  Supine Hamstring Stretch with Strap  - 1 x daily - 7 x weekly - 1 sets - 3 reps - 30 sec hold - Supine ITB Stretch with Strap  - 1 x daily - 7 x weekly - 1 sets - 3 reps - 30 sec hold - Supine Figure 4 Piriformis Stretch  - 1 x daily - 7 x weekly - 1 sets - 3 reps - 30 sec hold - Supine Piriformis Stretch Pulling Heel to Hip  - 1 x daily - 7 x weekly - 1 sets - 3 reps - 30 sec hold - Supine Butterfly Groin Stretch  - 1 x daily - 7 x weekly - 1 sets - 3 reps - 30 sec hold - Side Lunge Adductor Stretch  - 1 x daily - 7 x weekly - 1 sets - 10 reps - 10 hold - Hip Abduction and Adduction Caregiver PROM  - 1 x daily - 7 x weekly - 1 sets - 3 reps - 30 sec hold - Small Range Straight Leg Raise  - 1 x daily - 7 x weekly - 2 sets - 10 reps - Sidelying Hip Abduction  - 1 x daily - 7 x weekly - 2 sets - 10 reps - Prone Hip Extension  - 1 x daily - 7 x weekly - 2 sets - 10 reps - Sidelying Hip Adduction  - 1 x daily - 7 x weekly - 2 sets - 10 reps - Standing Quad Stretch with Table and Chair Support  - 1 x daily - 7 x weekly - 1 sets - 3 reps - 30sec hold - Supine Hip Internal and External Rotation  - 1 x daily - 7 x weekly - 1 sets - 3 reps - 30 sec holdAccess Code: [email or web address] URL: [email or web address] Date: 07/21/2021 Prepared by: Candyce Churn  Exercises - Supine Hamstring Stretch with Strap  - 1 x daily - 7 x weekly - 1 sets - 3 reps - 30 sec hold - Supine ITB Stretch with Strap  - 1 x daily - 7 x weekly - 1 sets - 3 reps - 30 sec hold - Supine Figure 4 Piriformis Stretch  - 1 x daily - 7 x weekly - 1 sets - 3 reps - 30 sec hold - Supine Piriformis Stretch Pulling Heel to Hip  - 1 x daily - 7 x weekly - 1 sets - 3 reps - 30 sec hold - Supine Butterfly Groin Stretch  - 1 x daily - 7 x weekly - 1 sets - 3 reps - 30 sec hold - Side Lunge Adductor Stretch  - 1 x daily - 7 x weekly - 1 sets - 10 reps - 10 hold - Hip Abduction and Adduction Caregiver PROM  - 1 x daily - 7 x weekly - 1 sets - 3 reps - 30 sec hold - Small Range Straight Leg Raise  - 1 x daily - 7 x weekly - 2 sets - 10 reps - Sidelying Hip Abduction  - 1 x daily - 7 x weekly - 2 sets - 10 reps - Prone Hip Extension  - 1 x daily - 7 x weekly - 2 sets - 10 reps - Sidelying Hip Adduction  - 1 x daily - 7 x weekly - 2 sets - 10 reps   ASSESSMENT:   CLINICAL IMPRESSION: Xsavier continues to have pain locally in the area just below the ilium on the left hip.  He states this has  move a little to the anterior hip and quad.    He continues to have significant hip ROM issues.  We transitioned to more advanced stretching and strengthening today to see what this will yield.  We will continue with PT until MRI and proceed based on findings.  He would benefit from continued skilled PT to address his hip flexibility issues and to continue strength training to stabilize left hip.  Pt continues to require skilled PT to progress towards goal related activities.     OBJECTIVE IMPAIRMENTS Abnormal gait, difficulty walking, decreased ROM, decreased strength, hypomobility, increased fascial restrictions, increased muscle spasms, impaired flexibility, and pain.    ACTIVITY LIMITATIONS carrying, lifting, bending, sitting, standing, squatting, sleeping, stairs, transfers, and dressing    PARTICIPATION LIMITATIONS: cleaning, laundry, driving, shopping, occupation, and yard work   PERSONAL FACTORS Fitness, Profession, and Time since onset of injury/illness/exacerbation are also affecting patient's functional outcome.    REHAB POTENTIAL: Good   CLINICAL DECISION MAKING: Stable/uncomplicated   EVALUATION COMPLEXITY: Low     GOALS: Goals reviewed with patient? Yes   SHORT TERM GOALS: Target date: 09/13/2021  Patient will be independent with initial HEP  Baseline: Goal status: MET  2.  Pain report to be no greater than 4/10  Baseline: 3-4/10 max (08/01/21) Goal status: MET   3.  Normal heel to toe gait Baseline:  Goal status: MET (08/15/21)     LONG TERM GOALS: Target date: 09/13/2021    Patient to be independent with advanced HEP  Baseline:  Goal status: Progressing   2.  Patient to report pain no greater than 2/10  Baseline: 3-4/10 (08/01/21) Goal status: In progress    3.  Patient to be able to sleep through the night  Baseline: no pain at night (08/15/21) Goal status: MET   4.  FOTO to be 24 Baseline: 71 on 09/05/21 Goal status: MET   5.  Patient to be able to don/doff shoes and socks and apply lotion to his feet without pain Baseline:  Goal status: Progressing   6.  Patient to report 85% improvement in overall symptoms.  Baseline: 70% reported (08/15/21) (On 09/05/21 patient reported "maybe a tick down from 70%) Goal status: In progress      PLAN: PT FREQUENCY: 1-2x/week   PT DURATION: 8 weeks   PLANNED INTERVENTIONS: Therapeutic exercises, Therapeutic activity, Neuromuscular re-education, Balance training, Gait training, Patient/Family education, Self Care, Joint mobilization, Stair training, Aquatic Therapy, Dry Needling, Electrical stimulation, Cryotherapy, Moist heat, Taping, Ultrasound, Ionotophoresis 80m/ml Dexamethasone, Manual therapy, and Re-evaluation   PLAN FOR NEXT SESSION:  We will continue skilled PT to address hip ROM and flexibility.   We will continue for an additional 6 weeks starting 09/11/21.  He will do 1-2 times per week depending on symptoms.  Continue myofascial work at the left hip, [place] patient responded to initial treatment from last visit.  Iontophoresis #2 if no adverse effects from ionto #1.  DN if indicated, addaday, Gluteal strength, flexibility and manual to address pain. If patient is not responding and able to discontinue the Meloxicam, we will have him see MD for possible cortisone injection.     JAnderson MaltaB. Dianara Smullen, PT 09/20/21 9:31 AM   [address] [place] [address]Phone # [phone] [phone]

## 2021-09-26 ENCOUNTER — Ambulatory Visit: Payer: 59

## 2021-10-06 ENCOUNTER — Ambulatory Visit
Admission: RE | Admit: 2021-10-06 | Discharge: 2021-10-06 | Disposition: A | Discharge status: Home / Self Care | Payer: 59 | Source: Ambulatory Visit | Attending: Family Medicine | Admitting: Family Medicine

## 2021-10-06 DIAGNOSIS — M79652 Pain in left thigh: Secondary | ICD-10-CM

## 2021-10-06 DIAGNOSIS — M25552 Pain in left hip: Secondary | ICD-10-CM

## 2022-01-17 ENCOUNTER — Inpatient Hospital Stay: Payer: 59 | Attending: Oncology

## 2022-01-17 ENCOUNTER — Inpatient Hospital Stay: Payer: 59 | Admitting: Oncology

## 2022-01-17 VITALS — BP 130/87 | HR 99 | Temp 98.1°F | Resp 18 | Ht 68.0 in | Wt 212.6 lb

## 2022-01-17 DIAGNOSIS — E119 Type 2 diabetes mellitus without complications: Secondary | ICD-10-CM | POA: Insufficient documentation

## 2022-01-17 DIAGNOSIS — C20 Malignant neoplasm of rectum: Secondary | ICD-10-CM

## 2022-01-17 LAB — CEA (ACCESS): CEA (CHCC): 1.12 ng/mL (ref 0.00–5.00)

## 2022-01-17 NOTE — Progress Notes (Signed)
[name] OFFICE PROGRESS NOTE   Diagnosis: Rectal cancer  INTERVAL HISTORY:   Charles [name] is as scheduled.  He feels well.  Good appetite.  No difficulty with bowel function.  He is hemoglobin A1c continues to be high.  He has mild persistent neuropathy symptoms in the hands and feet.  This does not interfere with activity.  He reports a negative colonoscopy with Dr. Michail Sermon in October.  Objective:  Vital signs in last 24 hours:  Blood pressure 130/87, pulse 99, temperature 98.1 F (36.7 C), temperature source Oral, resp. rate 18, height '5\' 8"'$  (1.727 m), weight 212 lb 9.6 oz (96.4 kg), SpO2 99 %.   Lymphatics: No cervical, supraclavicular, axillary, or inguinal nodes Resp: Lungs clear bilaterally Cardio: Regular rate and rhythm GI: No hepatosplenomegaly, no mass, nontender Vascular: No leg edema   Lab Results:  Lab Results  Component Value Date   WBC 5.1 02/17/2018   HGB 12.8 (L) 02/17/2018   HCT 38.4 (L) 02/17/2018   MCV 94.3 02/17/2018   PLT 175 02/17/2018   NEUTROABS 4.1 02/17/2018    CMP  Lab Results  Component Value Date   NA 135 07/07/2020   K 4.1 07/07/2020   CL 99 07/07/2020   CO2 28 07/07/2020   GLUCOSE 168 (H) 07/07/2020   BUN 12 07/07/2020   CREATININE 0.88 07/07/2020   CALCIUM 9.8 07/07/2020   PROT 7.4 04/04/2018   ALBUMIN 3.8 04/04/2018   AST 11 (L) 04/04/2018   ALT 16 04/04/2018   ALKPHOS 123 04/04/2018   BILITOT 0.6 04/04/2018   GFRNONAA >60 07/07/2020   GFRAA >60 07/06/2019    Lab Results  Component Value Date   CEA1 1.28 07/07/2020   CEA 1.42 07/17/2021     Medications: I have reviewed the patient's current medications.   Assessment/Plan: Rectal cancer, clinical stage III Colonoscopy 05/24/2017, mass at 12 cm from the anal verge, biopsy confirmed invasive poorly differentiated adenocarcinoma CTs 06/06/2017-rectal mass, perirectal adenopathy, no evidence of metastatic disease, nonspecific 1 cm sclerotic lesion at  the right iliac crest MRI 06/06/2017, T3cN1 tumor measured at 5.3 cm from the anal sphincter Cycle 1 FOLFOX 06/20/2017 Cycle 2 FOLFOX 07/04/2017 Cycle 3 FOLFOX 07/17/2017 Cycle 4 FOLFOX 08/01/2017 (Emend added secondary to prolonged nausea following chemotherapy) Cycle 5 FOLFOX 08/15/2017 Cycle 6 FOLFOX 08/29/2017 Cycle 7 FOLFOX 09/12/2017 Cycle 8 FOLFOX 09/26/2017 (dose reduced due to neuropathy and thrombocytopenia) Initiation of radiation and capecitabine 10/16/2017, completed 11/22/2017 Restaging CTs 12/10/2017- no evidence of metastatic disease, decrease in the rectal mass and near complete resolution of perirectal lymphadenopathy  Status post low anterior resection 01/17/2018-1 cm poorly differentiated adenocarcinoma; tumor limited to submucosa; margins negative; metastatic carcinoma in 1 of 18 lymph nodes; isolated tumor cells in 1 lymph node; treatment effect present; no loss of mismatch repair protein expression  CTs 07/04/2018-no evidence of recurrent disease Colonoscopy 06/24/2018-polyps removed from the ascending and descending colon, tubular adenoma and a benign submucosal myoma CTs 07/29/2019-no evidence of recurrent disease CT 07/07/2020-no evidence of recurrent disease   Diabetes Glaucoma Cecal polyp, tubular adenoma noted on the colonoscopy 05/24/2017 Port-A-Cath placement, Dr. Dema Severin, 06/17/2017 Oxaliplatin neuropathy-improved       Disposition: [name] is in clinical remission from rectal cancer.  We will follow-up on the CEA from today.  He will return for an office visit and CEA in 6 months.  We will follow-up on the colonoscopy report from Dr. Michail Sermon.  Betsy Coder, MD  01/17/2022  9:34 AM

## 2022-07-16 ENCOUNTER — Telehealth: Payer: Self-pay | Admitting: *Deleted

## 2022-07-16 ENCOUNTER — Inpatient Hospital Stay: Payer: 59 | Attending: Oncology

## 2022-07-16 ENCOUNTER — Inpatient Hospital Stay: Payer: 59 | Admitting: Oncology

## 2022-07-16 ENCOUNTER — Encounter: Payer: Self-pay | Admitting: Oncology

## 2022-07-16 VITALS — BP 125/78 | HR 99 | Temp 97.9°F | Resp 20 | Ht 68.0 in | Wt 216.5 lb

## 2022-07-16 DIAGNOSIS — C20 Malignant neoplasm of rectum: Secondary | ICD-10-CM | POA: Diagnosis present

## 2022-07-16 DIAGNOSIS — E119 Type 2 diabetes mellitus without complications: Secondary | ICD-10-CM | POA: Insufficient documentation

## 2022-07-16 DIAGNOSIS — Z923 Personal history of irradiation: Secondary | ICD-10-CM | POA: Insufficient documentation

## 2022-07-16 LAB — CEA (ACCESS): CEA (CHCC): 1.08 ng/mL (ref 0.00–5.00)

## 2022-07-16 NOTE — Progress Notes (Signed)
  [facility] OFFICE PROGRESS NOTE   Diagnosis: Rectal cancer  INTERVAL HISTORY:   Charles [name] returns as scheduled.  He feels well.  He reports approximately 3 bowel movements per day.  Good appetite.  He has persistent mild neuropathy symptoms in the extremities.  This does not interfere with activity.  He had a colonoscopy last year.  Objective:  Vital signs in last 24 hours:  Blood pressure 125/78, pulse 99, temperature 97.9 F (36.6 C), temperature source Oral, resp. rate 20, height 5\' 8"  (1.727 m), weight 216 lb 8 oz (98.2 kg), SpO2 100 %.    Lymphatics: No cervical, supraclavicular, axillary, or inguinal nodes Resp: Lungs clear bilaterally Cardio: Regular rate and rhythm GI: No hepatosplenomegaly, no apparent ascites, nontender, no mass Vascular: No leg edema  Lab Results:  Lab Results  Component Value Date   WBC 5.1 02/17/2018   HGB 12.8 (L) 02/17/2018   HCT 38.4 (L) 02/17/2018   MCV 94.3 02/17/2018   PLT 175 02/17/2018   NEUTROABS 4.1 02/17/2018    CMP  Lab Results  Component Value Date   NA 135 07/07/2020   K 4.1 07/07/2020   CL 99 07/07/2020   CO2 28 07/07/2020   GLUCOSE 168 (H) 07/07/2020   BUN 12 07/07/2020   CREATININE 0.88 07/07/2020   CALCIUM 9.8 07/07/2020   PROT 7.4 04/04/2018   ALBUMIN 3.8 04/04/2018   AST 11 (L) 04/04/2018   ALT 16 04/04/2018   ALKPHOS 123 04/04/2018   BILITOT 0.6 04/04/2018   GFRNONAA >60 07/07/2020   GFRAA >60 07/06/2019    Lab Results  Component Value Date   CEA1 1.28 07/07/2020   CEA 1.12 01/17/2022    Imaging:  No results found.  Medications: I have reviewed the patient's current medications.   Assessment/Plan: Rectal cancer, clinical stage III Colonoscopy 05/24/2017, mass at 12 cm from the anal verge, biopsy confirmed invasive poorly differentiated adenocarcinoma CTs 06/06/2017-rectal mass, perirectal adenopathy, no evidence of metastatic disease, nonspecific 1 cm sclerotic lesion at the  right iliac crest MRI 06/06/2017, T3cN1 tumor measured at 5.3 cm from the anal sphincter Cycle 1 FOLFOX 06/20/2017 Cycle 2 FOLFOX 07/04/2017 Cycle 3 FOLFOX 07/17/2017 Cycle 4 FOLFOX 08/01/2017 (Emend added secondary to prolonged nausea following chemotherapy) Cycle 5 FOLFOX 08/15/2017 Cycle 6 FOLFOX 08/29/2017 Cycle 7 FOLFOX 09/12/2017 Cycle 8 FOLFOX 09/26/2017 (dose reduced due to neuropathy and thrombocytopenia) Initiation of radiation and capecitabine 10/16/2017, completed 11/22/2017 Restaging CTs 12/10/2017- no evidence of metastatic disease, decrease in the rectal mass and near complete resolution of perirectal lymphadenopathy  Status post low anterior resection 01/17/2018-1 cm poorly differentiated adenocarcinoma; tumor limited to submucosa; margins negative; metastatic carcinoma in 1 of 18 lymph nodes; isolated tumor cells in 1 lymph node; treatment effect present; no loss of mismatch repair protein expression  CTs 07/04/2018-no evidence of recurrent disease Colonoscopy 06/24/2018-polyps removed from the ascending and descending colon, tubular adenoma and a benign submucosal myoma CTs 07/29/2019-no evidence of recurrent disease CT 07/07/2020-no evidence of recurrent disease   Diabetes Glaucoma Cecal polyp, tubular adenoma noted on the colonoscopy 05/24/2017 Port-A-Cath placement, Dr. Cliffton Asters, 06/17/2017 Oxaliplatin neuropathy-improved       Disposition: Charles [name] is in clinical remission from rectal cancer.  We will follow-up on the CEA from today.  He would like to continue follow-up at the Cancer center.  He will return for an office visit and CEA in 6 months.  Thornton Papas, MD  07/16/2022  8:34 AM

## 2022-07-16 NOTE — Telephone Encounter (Signed)
-----   Message from Ladene Artist, MD sent at 07/16/2022  2:49 PM EDT ----- Please call patient, the CEA is normal, follow-up as scheduled

## 2022-07-16 NOTE — Telephone Encounter (Signed)
Made aware of normal CEA and f/u as scheduled.

## 2022-10-04 ENCOUNTER — Encounter: Payer: Self-pay | Admitting: Gastroenterology

## 2023-01-16 ENCOUNTER — Other Ambulatory Visit: Payer: 59

## 2023-01-16 ENCOUNTER — Other Ambulatory Visit: Payer: Self-pay

## 2023-01-16 ENCOUNTER — Ambulatory Visit: Payer: 59 | Admitting: Oncology

## 2023-01-21 ENCOUNTER — Ambulatory Visit: Payer: 59 | Admitting: Oncology

## 2023-01-21 ENCOUNTER — Other Ambulatory Visit: Payer: 59

## 2023-01-29 ENCOUNTER — Inpatient Hospital Stay: Payer: 59 | Admitting: Oncology

## 2023-01-29 ENCOUNTER — Inpatient Hospital Stay: Payer: 59 | Attending: Oncology

## 2023-01-29 VITALS — BP 139/68 | HR 100 | Temp 97.9°F | Resp 18 | Ht 68.0 in | Wt 211.6 lb

## 2023-01-29 DIAGNOSIS — Z923 Personal history of irradiation: Secondary | ICD-10-CM | POA: Diagnosis not present

## 2023-01-29 DIAGNOSIS — E119 Type 2 diabetes mellitus without complications: Secondary | ICD-10-CM | POA: Insufficient documentation

## 2023-01-29 DIAGNOSIS — C20 Malignant neoplasm of rectum: Secondary | ICD-10-CM | POA: Insufficient documentation

## 2023-01-29 LAB — CEA (ACCESS): CEA (CHCC): 1.21 ng/mL (ref 0.00–5.00)

## 2023-01-29 NOTE — Progress Notes (Signed)
[facility] OFFICE PROGRESS NOTE   Diagnosis: Rectal cancer  INTERVAL HISTORY:   Charles [name] returns as scheduled.  He generally feels well.  He has mild persistent numbness in the feet.  He has intermittent episodes of nausea and abdominal discomfort.  He feels this may be related to [facility] therapy.  He reports a few episodes of rectal bleeding when wiping 2 weeks ago.  This has resolved.  Objective:  Vital signs in last 24 hours:  Blood pressure 139/68, pulse 100, temperature 97.9 F (36.6 C), temperature source Temporal, resp. rate 18, height 5\' 8"  (1.727 m), weight 211 lb 9.6 oz (96 kg), SpO2 100%.     Lymphatics: No cervical, supraclavicular, axillary, or inguinal nodes Resp: Lungs clear bilaterally Cardio: Regular rate and rhythm, tachycardia GI: Nontender, no mass, no hepatosplenomegaly Vascular: No leg edema   Lab Results:  Lab Results  Component Value Date   WBC 5.1 02/17/2018   HGB 12.8 (L) 02/17/2018   HCT 38.4 (L) 02/17/2018   MCV 94.3 02/17/2018   PLT 175 02/17/2018   NEUTROABS 4.1 02/17/2018    CMP  Lab Results  Component Value Date   NA 135 07/07/2020   K 4.1 07/07/2020   CL 99 07/07/2020   CO2 28 07/07/2020   GLUCOSE 168 (H) 07/07/2020   BUN 12 07/07/2020   CREATININE 0.88 07/07/2020   CALCIUM 9.8 07/07/2020   PROT 7.4 04/04/2018   ALBUMIN 3.8 04/04/2018   AST 11 (L) 04/04/2018   ALT 16 04/04/2018   ALKPHOS 123 04/04/2018   BILITOT 0.6 04/04/2018   GFRNONAA >60 07/07/2020   GFRAA >60 07/06/2019    Lab Results  Component Value Date   CEA1 1.28 07/07/2020   CEA 1.21 01/29/2023     Medications: I have reviewed the patient's current medications.   Assessment/Plan: Rectal cancer, clinical stage III Colonoscopy 05/24/2017, mass at 12 cm from the anal verge, biopsy confirmed invasive poorly differentiated adenocarcinoma CTs 06/06/2017-rectal mass, perirectal adenopathy, no evidence of metastatic disease, nonspecific 1 cm  sclerotic lesion at the right iliac crest MRI 06/06/2017, T3cN1 tumor measured at 5.3 cm from the anal sphincter Cycle 1 FOLFOX 06/20/2017 Cycle 2 FOLFOX 07/04/2017 Cycle 3 FOLFOX 07/17/2017 Cycle 4 FOLFOX 08/01/2017 (Emend added secondary to prolonged nausea following chemotherapy) Cycle 5 FOLFOX 08/15/2017 Cycle 6 FOLFOX 08/29/2017 Cycle 7 FOLFOX 09/12/2017 Cycle 8 FOLFOX 09/26/2017 (dose reduced due to neuropathy and thrombocytopenia) Initiation of radiation and capecitabine 10/16/2017, completed 11/22/2017 Restaging CTs 12/10/2017- no evidence of metastatic disease, decrease in the rectal mass and near complete resolution of perirectal lymphadenopathy  Status post low anterior resection 01/17/2018-1 cm poorly differentiated adenocarcinoma; tumor limited to submucosa; margins negative; metastatic carcinoma in 1 of 18 lymph nodes; isolated tumor cells in 1 lymph node; treatment effect present; no loss of mismatch repair protein expression  CTs 07/04/2018-no evidence of recurrent disease Colonoscopy 06/24/2018-polyps removed from the ascending and descending colon, tubular adenoma and a benign submucosal myoma CTs 07/29/2019-no evidence of recurrent disease CT 07/07/2020-no evidence of recurrent disease Colonoscopy 09/28/2021-diverticulosis in the sigmoid colon, internal hemorrhoids   Diabetes Glaucoma Cecal polyp, tubular adenoma noted on the colonoscopy 05/24/2017 Port-A-Cath placement, Dr. Cliffton Asters, 06/17/2017 Oxaliplatin neuropathy-improved       Disposition: Charles [name] is in clinical remission from rectal cancer.  He would like to continue follow-up at the Cancer center.  He will return for an office visit and CEA in 6 months.  He will follow-up with Dr. Bosie Clos if he develops  recurrent rectal bleeding.  He continues colonoscopy surveillance with Dr. Bosie Clos, last colonoscopy was 09/28/2021.  Thornton Papas, MD  01/29/2023  11:27 AM

## 2023-07-30 ENCOUNTER — Inpatient Hospital Stay: Payer: 59 | Admitting: Oncology

## 2023-07-30 ENCOUNTER — Inpatient Hospital Stay: Payer: 59 | Attending: Oncology

## 2023-07-30 VITALS — BP 125/83 | HR 100 | Temp 98.0°F | Resp 18 | Ht 68.0 in | Wt 210.7 lb

## 2023-07-30 DIAGNOSIS — Z7985 Long-term (current) use of injectable non-insulin antidiabetic drugs: Secondary | ICD-10-CM | POA: Insufficient documentation

## 2023-07-30 DIAGNOSIS — C20 Malignant neoplasm of rectum: Secondary | ICD-10-CM

## 2023-07-30 DIAGNOSIS — Z923 Personal history of irradiation: Secondary | ICD-10-CM | POA: Diagnosis not present

## 2023-07-30 DIAGNOSIS — E119 Type 2 diabetes mellitus without complications: Secondary | ICD-10-CM | POA: Diagnosis not present

## 2023-07-30 LAB — CEA (ACCESS): CEA (CHCC): <1 ng/mL (ref 0.00–5.00)

## 2023-07-30 NOTE — Progress Notes (Signed)
  [facility] OFFICE PROGRESS NOTE   Diagnosis: Rectal cancer  INTERVAL HISTORY:   Charles [name] returns as scheduled.  He feels well.  He is now taking Mounjaro for diabetes.  He has increased stool frequency while on Mounjaro.  No bleeding.  He is working.  Objective:  Vital signs in last 24 hours:  Blood pressure 125/83, pulse 100, temperature 98 F (36.7 C), temperature source Temporal, resp. rate 18, height 5' 8 (1.727 m), weight 210 lb 11.2 oz (95.6 kg), SpO2 100%.    Lymphatics: No cervical, supraclavicular, axillary, or inguinal nodes Resp: Lungs clear bilaterally Cardio: Distant heart sounds, tachycardia, regular rate and rhythm GI: No hepatosplenomegaly, no mass, nontender Vascular: Leg edema   Lab Results:  Lab Results  Component Value Date   WBC 5.1 02/17/2018   HGB 12.8 (L) 02/17/2018   HCT 38.4 (L) 02/17/2018   MCV 94.3 02/17/2018   PLT 175 02/17/2018   NEUTROABS 4.1 02/17/2018    CMP  Lab Results  Component Value Date   NA 135 07/07/2020   K 4.1 07/07/2020   CL 99 07/07/2020   CO2 28 07/07/2020   GLUCOSE 168 (H) 07/07/2020   BUN 12 07/07/2020   CREATININE 0.88 07/07/2020   CALCIUM  9.8 07/07/2020   PROT 7.4 04/04/2018   ALBUMIN 3.8 04/04/2018   AST 11 (L) 04/04/2018   ALT 16 04/04/2018   ALKPHOS 123 04/04/2018   BILITOT 0.6 04/04/2018   GFRNONAA >60 07/07/2020   GFRAA >60 07/06/2019    Lab Results  Component Value Date   CEA1 1.28 07/07/2020   CEA 1.21 01/29/2023     Medications: I have reviewed the patient's current medications.   Assessment/Plan: Rectal cancer, clinical stage III Colonoscopy 05/24/2017, mass at 12 cm from the anal verge, biopsy confirmed invasive poorly differentiated adenocarcinoma CTs 06/06/2017-rectal mass, perirectal adenopathy, no evidence of metastatic disease, nonspecific 1 cm sclerotic lesion at the right iliac crest MRI 06/06/2017, T3cN1 tumor measured at 5.3 cm from the anal sphincter Cycle 1  FOLFOX 06/20/2017 Cycle 2 FOLFOX 07/04/2017 Cycle 3 FOLFOX 07/17/2017 Cycle 4 FOLFOX 08/01/2017 (Emend  added secondary to prolonged nausea following chemotherapy) Cycle 5 FOLFOX 08/15/2017 Cycle 6 FOLFOX 08/29/2017 Cycle 7 FOLFOX 09/12/2017 Cycle 8 FOLFOX 09/26/2017 (dose reduced due to neuropathy and thrombocytopenia) Initiation of radiation and capecitabine  10/16/2017, completed 11/22/2017 Restaging CTs 12/10/2017- no evidence of metastatic disease, decrease in the rectal mass and near complete resolution of perirectal lymphadenopathy  Status post low anterior resection 01/17/2018-1 cm poorly differentiated adenocarcinoma; tumor limited to submucosa; margins negative; metastatic carcinoma in 1 of 18 lymph nodes; isolated tumor cells in 1 lymph node; treatment effect present; no loss of mismatch repair protein expression  CTs 07/04/2018-no evidence of recurrent disease Colonoscopy 06/24/2018-polyps removed from the ascending and descending colon, tubular adenoma and a benign submucosal myoma CTs 07/29/2019-no evidence of recurrent disease CT 07/07/2020-no evidence of recurrent disease Colonoscopy 09/28/2021-diverticulosis in the sigmoid colon, internal hemorrhoids   Diabetes Glaucoma Cecal polyp, tubular adenoma noted on the colonoscopy 05/24/2017 Port-A-Cath placement, Dr. Teresa, 06/17/2017 Oxaliplatin  neuropathy-improved     Disposition: Charles [name] is in clinical remission from rectal cancer.  He would like to continue every 29-month follow-up with the cancer center.  He will return for an office visit and CEA in 6 months.  Arley Hof, MD  07/30/2023  10:58 AM

## 2023-10-24 ENCOUNTER — Other Ambulatory Visit (HOSPITAL_COMMUNITY): Payer: Self-pay | Admitting: Family Medicine

## 2023-10-24 DIAGNOSIS — Z9221 Personal history of antineoplastic chemotherapy: Secondary | ICD-10-CM

## 2023-10-24 DIAGNOSIS — R000 Tachycardia, unspecified: Secondary | ICD-10-CM

## 2023-10-28 ENCOUNTER — Other Ambulatory Visit (HOSPITAL_COMMUNITY): Payer: Self-pay | Admitting: Family Medicine

## 2023-10-28 DIAGNOSIS — E7800 Pure hypercholesterolemia, unspecified: Secondary | ICD-10-CM

## 2023-11-08 ENCOUNTER — Ambulatory Visit (HOSPITAL_BASED_OUTPATIENT_CLINIC_OR_DEPARTMENT_OTHER)
Admission: RE | Admit: 2023-11-08 | Discharge: 2023-11-08 | Disposition: A | Discharge status: Home / Self Care | Payer: Self-pay | Source: Ambulatory Visit | Attending: Family Medicine | Admitting: Family Medicine

## 2023-11-08 DIAGNOSIS — E7800 Pure hypercholesterolemia, unspecified: Secondary | ICD-10-CM | POA: Insufficient documentation

## 2023-11-14 ENCOUNTER — Ambulatory Visit (HOSPITAL_COMMUNITY)
Admission: RE | Admit: 2023-11-14 | Discharge: 2023-11-14 | Disposition: A | Discharge status: Home / Self Care | Source: Ambulatory Visit | Attending: Family Medicine | Admitting: Family Medicine

## 2023-11-14 DIAGNOSIS — R000 Tachycardia, unspecified: Secondary | ICD-10-CM | POA: Diagnosis present

## 2023-11-14 DIAGNOSIS — Z9221 Personal history of antineoplastic chemotherapy: Secondary | ICD-10-CM | POA: Insufficient documentation

## 2023-11-14 LAB — ECHOCARDIOGRAM COMPLETE
AR max vel: 2.94 cm2
AV Area VTI: 3.05 cm2
AV Area mean vel: 3.36 cm2
AV Mean grad: 3 mmHg
AV Peak grad: 5.7 mmHg
Ao pk vel: 1.19 m/s
Calc EF: 57.5 %
MV VTI: 3.47 cm2
S' Lateral: 3.2 cm
Single Plane A2C EF: 57.9 %
Single Plane A4C EF: 57 %

## 2024-01-27 ENCOUNTER — Inpatient Hospital Stay: Admitting: Oncology

## 2024-01-27 ENCOUNTER — Inpatient Hospital Stay: Attending: Oncology

## 2024-01-27 VITALS — BP 133/73 | HR 100 | Temp 97.8°F | Resp 18 | Ht 68.0 in | Wt 208.8 lb

## 2024-01-27 DIAGNOSIS — Z85048 Personal history of other malignant neoplasm of rectum, rectosigmoid junction, and anus: Secondary | ICD-10-CM | POA: Diagnosis present

## 2024-01-27 DIAGNOSIS — Z923 Personal history of irradiation: Secondary | ICD-10-CM | POA: Insufficient documentation

## 2024-01-27 DIAGNOSIS — Z08 Encounter for follow-up examination after completed treatment for malignant neoplasm: Secondary | ICD-10-CM | POA: Diagnosis present

## 2024-01-27 DIAGNOSIS — C20 Malignant neoplasm of rectum: Secondary | ICD-10-CM | POA: Diagnosis not present

## 2024-01-27 LAB — CEA (ACCESS): CEA (CHCC): 1.33 ng/mL (ref 0.00–5.00)

## 2024-01-27 NOTE — Progress Notes (Signed)
" °  Charles [name]   Diagnosis: Rectal cancer  INTERVAL HISTORY:   Charles [name] returns as scheduled.  He feels well.  Good appetite and energy level.  He reports mild neuropathy symptoms in the feet.  No new complaint.  Objective:  Vital signs in last 24 hours:  Blood pressure 133/73, pulse 100, temperature 97.8 F (36.6 C), temperature source Temporal, resp. rate 18, height 5' 8 (1.727 m), weight 208 lb 12.8 oz (94.7 kg), SpO2 100%.     Lymphatics: No cervical, supraclavicular, axillary, or inguinal nodes Resp: Lungs clear bilaterally Cardio: Regular rate and rhythm GI: No hepatosplenomegaly, nontender, no mass Vascular: No leg edema   Lab Results:  Lab Results  Component Value Date   WBC 5.1 02/17/2018   HGB 12.8 (L) 02/17/2018   HCT 38.4 (L) 02/17/2018   MCV 94.3 02/17/2018   PLT 175 02/17/2018   NEUTROABS 4.1 02/17/2018    CMP  Lab Results  Component Value Date   NA 135 07/07/2020   K 4.1 07/07/2020   CL 99 07/07/2020   CO2 28 07/07/2020   GLUCOSE 168 (H) 07/07/2020   BUN 12 07/07/2020   CREATININE 0.88 07/07/2020   CALCIUM  9.8 07/07/2020   PROT 7.4 04/04/2018   ALBUMIN 3.8 04/04/2018   AST 11 (L) 04/04/2018   ALT 16 04/04/2018   ALKPHOS 123 04/04/2018   BILITOT 0.6 04/04/2018   GFRNONAA >60 07/07/2020   GFRAA >60 07/06/2019    Lab Results  Component Value Date   CEA1 1.28 07/07/2020   CEA 1.33 01/27/2024    Medications: I have reviewed the patient's current medications.   Assessment/Plan: Rectal cancer, clinical stage III Colonoscopy 05/24/2017, mass at 12 cm from the anal verge, biopsy confirmed invasive poorly differentiated adenocarcinoma CTs 06/06/2017-rectal mass, perirectal adenopathy, no evidence of metastatic disease, nonspecific 1 cm sclerotic lesion at the right iliac crest MRI 06/06/2017, T3cN1 tumor measured at 5.3 cm from the anal sphincter Cycle 1 FOLFOX 06/20/2017 Cycle 2 FOLFOX 07/04/2017 Cycle 3  FOLFOX 07/17/2017 Cycle 4 FOLFOX 08/01/2017 (Emend  added secondary to prolonged nausea following chemotherapy) Cycle 5 FOLFOX 08/15/2017 Cycle 6 FOLFOX 08/29/2017 Cycle 7 FOLFOX 09/12/2017 Cycle 8 FOLFOX 09/26/2017 (dose reduced due to neuropathy and thrombocytopenia) Initiation of radiation and capecitabine  10/16/2017, completed 11/22/2017 Restaging CTs 12/10/2017- no evidence of metastatic disease, decrease in the rectal mass and near complete resolution of perirectal lymphadenopathy  Status post low anterior resection 01/17/2018-1 cm poorly differentiated adenocarcinoma; tumor limited to submucosa; margins negative; metastatic carcinoma in 1 of 18 lymph nodes; isolated tumor cells in 1 lymph node; treatment effect present; no loss of mismatch repair protein expression  CTs 07/04/2018-no evidence of recurrent disease Colonoscopy 06/24/2018-polyps removed from the ascending and descending colon, tubular adenoma and a benign submucosal myoma CTs 07/29/2019-no evidence of recurrent disease CT 07/07/2020-no evidence of recurrent disease Colonoscopy 09/28/2021-diverticulosis in the sigmoid colon, internal hemorrhoids   Diabetes Glaucoma Cecal polyp, tubular adenoma noted on the colonoscopy 05/24/2017 Port-A-Cath placement, Dr. Teresa, 06/17/2017 Oxaliplatin  neuropathy-improved     Disposition: Charles [name] remains in clinical remission from rectal cancer.  He would like continue follow-up at the Cancer center.  He will return for an office visit in 6 months.  Charles Hof, MD  01/27/2024  10:07 AM   "

## 2024-02-11 ENCOUNTER — Other Ambulatory Visit (HOSPITAL_COMMUNITY): Payer: Self-pay

## 2024-07-27 ENCOUNTER — Inpatient Hospital Stay

## 2024-07-27 ENCOUNTER — Inpatient Hospital Stay: Admitting: Oncology
